# Patient Record
Sex: Female | Born: 1955 | Race: Black or African American | Hispanic: No | Marital: Married | Smoking: Never smoker
Health system: Southern US, Community
[De-identification: ages and names within clinical notes are randomized; demographics above are authoritative.]

## PROBLEM LIST (undated history)

## (undated) DIAGNOSIS — Z5189 Encounter for other specified aftercare: Secondary | ICD-10-CM

## (undated) DIAGNOSIS — K449 Diaphragmatic hernia without obstruction or gangrene: Secondary | ICD-10-CM

## (undated) DIAGNOSIS — I10 Essential (primary) hypertension: Secondary | ICD-10-CM

## (undated) DIAGNOSIS — D649 Anemia, unspecified: Secondary | ICD-10-CM

## (undated) DIAGNOSIS — M1990 Unspecified osteoarthritis, unspecified site: Secondary | ICD-10-CM

## (undated) DIAGNOSIS — C189 Malignant neoplasm of colon, unspecified: Secondary | ICD-10-CM

## (undated) DIAGNOSIS — K219 Gastro-esophageal reflux disease without esophagitis: Secondary | ICD-10-CM

## (undated) DIAGNOSIS — D759 Disease of blood and blood-forming organs, unspecified: Secondary | ICD-10-CM

## (undated) DIAGNOSIS — T7840XA Allergy, unspecified, initial encounter: Secondary | ICD-10-CM

## (undated) HISTORY — DX: Diaphragmatic hernia without obstruction or gangrene: K44.9

## (undated) HISTORY — DX: Allergy, unspecified, initial encounter: T78.40XA

## (undated) HISTORY — DX: Unspecified osteoarthritis, unspecified site: M19.90

## (undated) HISTORY — DX: Essential (primary) hypertension: I10

## (undated) HISTORY — PX: OTHER SURGICAL HISTORY: SHX169

## (undated) HISTORY — DX: Encounter for other specified aftercare: Z51.89

## (undated) HISTORY — DX: Gastro-esophageal reflux disease without esophagitis: K21.9

## (undated) HISTORY — PX: COLONOSCOPY: SHX174

## (undated) HISTORY — DX: Anemia, unspecified: D64.9

## (undated) HISTORY — DX: Malignant neoplasm of colon, unspecified: C18.9

## (undated) HISTORY — PX: POLYPECTOMY: SHX149

---

## 1978-11-03 HISTORY — PX: CHOLECYSTECTOMY: SHX55

## 1987-11-04 HISTORY — PX: TUBAL LIGATION: SHX77

## 2007-11-04 DIAGNOSIS — C189 Malignant neoplasm of colon, unspecified: Secondary | ICD-10-CM

## 2007-11-04 HISTORY — PX: PARTIAL COLECTOMY: SHX5273

## 2007-11-04 HISTORY — DX: Malignant neoplasm of colon, unspecified: C18.9

## 2008-06-26 ENCOUNTER — Ambulatory Visit: Payer: Self-pay | Admitting: Gastroenterology

## 2008-07-04 ENCOUNTER — Encounter: Payer: Self-pay | Admitting: Gastroenterology

## 2008-07-04 ENCOUNTER — Ambulatory Visit: Payer: Self-pay | Admitting: Gastroenterology

## 2008-07-04 DIAGNOSIS — C184 Malignant neoplasm of transverse colon: Secondary | ICD-10-CM

## 2008-07-07 ENCOUNTER — Telehealth: Payer: Self-pay | Admitting: Gastroenterology

## 2008-07-07 ENCOUNTER — Encounter: Payer: Self-pay | Admitting: Gastroenterology

## 2008-07-11 ENCOUNTER — Telehealth (INDEPENDENT_AMBULATORY_CARE_PROVIDER_SITE_OTHER): Payer: Self-pay

## 2008-07-12 ENCOUNTER — Ambulatory Visit: Payer: Self-pay | Admitting: Gastroenterology

## 2008-08-21 ENCOUNTER — Encounter: Payer: Self-pay | Admitting: Gastroenterology

## 2008-08-21 ENCOUNTER — Encounter (INDEPENDENT_AMBULATORY_CARE_PROVIDER_SITE_OTHER): Payer: Self-pay | Admitting: Surgery

## 2008-08-21 ENCOUNTER — Inpatient Hospital Stay (HOSPITAL_COMMUNITY): Admission: RE | Admit: 2008-08-21 | Discharge: 2008-08-27 | Payer: Self-pay | Admitting: Surgery

## 2008-08-21 ENCOUNTER — Encounter: Payer: Self-pay | Admitting: Internal Medicine

## 2008-09-05 ENCOUNTER — Telehealth: Payer: Self-pay | Admitting: Gastroenterology

## 2010-07-01 ENCOUNTER — Encounter: Payer: Self-pay | Admitting: Gastroenterology

## 2010-07-11 ENCOUNTER — Encounter (INDEPENDENT_AMBULATORY_CARE_PROVIDER_SITE_OTHER): Payer: Self-pay | Admitting: *Deleted

## 2010-12-03 NOTE — Procedures (Signed)
Summary: Recall Assessment  Recall Assessment   Imported By: Lester [place] 07/15/2010 11:35:39  _____________________________________________________________________  External Attachment:    Type:   Image     Comment:   External Document

## 2010-12-03 NOTE — Letter (Signed)
Summary: Colonoscopy Letter  [facility]  [address] [place], [place] [address]   Phone: [phone]  Fax: [phone]      July 11, 2010 MRN: [id number]   [facility] [address] [place], [place]  [address]   Dear Ms. Grewe,   According to your medical record, it is time for you to schedule a Colonoscopy. The American Cancer Society recommends this procedure as a method to detect early colon cancer. Patients with a family history of colon cancer, or a personal history of colon polyps or inflammatory bowel disease are at increased risk.  This letter has beeen generated based on the recommendations made at the time of your procedure. If you feel that in your particular situation this may no longer apply, please contact our office.  Please call our office at [phone] to schedule this appointment or to update your records at your earliest convenience.  Thank you for cooperating with [place] to provide you with the very best care possible.   Sincerely,  Judie Petit T. Russella Dar, M.D.  [facility] [phone]

## 2011-03-18 NOTE — Op Note (Signed)
[name], [name]                ACCOUNT NO.:  [id number]   MEDICAL RECORD NO.:  [id number]          PATIENT TYPE:  INP   LOCATION:  1534                         FACILITY:  [facility]   PHYSICIAN:  [name], M.D. DATE OF BIRTH:  [birth year]   DATE OF PROCEDURE:  08/21/2008  DATE OF DISCHARGE:                               OPERATIVE REPORT   PREOPERATIVE DIAGNOSIS:  Transverse colon cancer.   POSTOPERATIVE DIAGNOSIS:  Transverse colon cancer.   PROCEDURE PERFORMED:  1. Exploratory laparotomy.  2. Colotomy with rigid endoscopy.  3. Flexible colonoscopy by Dr. Stan Parks and a partial left      colectomy.   SURGEON:  [name], M.D.   Threasa HeadsMaud Deed, M.D.   ANESTHESIA:  General endotracheal.   INDICATIONS:  The patient is a 55 year old female who recently underwent  a routine screening colonoscopy.  In the region of the colon, felt to be  the distal transverse colon, she had a polyp removed measuring about 1.5  cm.  This was pedunculated and it was removed with a snare.  This  colonoscopy was performed by Dr. Claudette Parks of [facility], the  remainder of her colonoscopy was normal.  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  There was a 2 mm margin at the stalk of the polyp.  Dr. Russella Parks brought  the patient back to the endoscopy unit for another procedure in which he  tattooed the area with what is recorded as 5 mL of ink.  I spoke with  Dr. Russella Parks the day that I evaluated the patient in the office and he  states that he had injected one area just opposite the polypectomy site  and another area 4 cm distal to the polypectomy site.  She presents  today for elective resection of the transverse colon with a likely left  hemicolectomy.   DESCRIPTION OF PROCEDURE:  The patient was brought to the operating room  and placed in the supine position operating table.  After an adequate  level of general  anesthesia was obtained, a Foley catheter was placed  under sterile technique.  The patient's abdomen was prepped with  Betadine and draped in a sterile fashion.  A time-out was taken to  assure the proper patient and proper procedure.  The patient has a  previous umbilical incision from a laparoscopy.  We opened this incision  and dissected down through a large amount of adipose tissue to the  fascia.  The fascia was grasped with Kocher clamps and opened  vertically.  We entered the peritoneal cavity bluntly.  A stay suture  was placed around the fascial opening and the Hasson cannula was  inserted.  The pneumoperitoneum was obtained by insufflating CO2  maintaining maximal pressure of 15 mmHg.  The laparoscope was inserted.  There was a large amount of omentum that was densely adherent to the  abdominal wall around the umbilicus.  We were able to get around this  and look in the upper abdomen.  The patient has a  lot of intraperitoneal  fat and her omentum was quite thick.  We could not visualize the  transverse colon.  The liver appeared grossly normal.  We then made the  decision to perform an upper midline incision.  We made a midline  incision with a #10 blade and dissected down to the subcutaneous tissues  with cautery.  The fascia was opened along the linea alba.  We extended  this down to the previous umbilical incision.  The patient was found to  have a moderate size umbilical hernia containing a large amount of  omentum.  This hernia was reduced and we resected a small amount of  omentum that had been stuck in the hernia sac.  This was done with the  LigaSure device.  We then identified the transverse colon.  This  appeared grossly normal.  We could not palpate any masses.  We inserted  the Bookwalter retractor for exposure.  We mobilized the colon heading a  the distal fashion up around the splenic flexure.  Unfortunately, we did  not see any ink in this area.  We continued  mobilizing the descending  colon all the way down the sigmoid.  Again, we did not see any sign of  ink.  We then went proximally around the hepatic flexure all the way  down to the cecum and did not see any sign of tattooing.  We spent a lot  of time fully mobilizing the colon, but we were unable to visualize any  ink at all.  We then brought a sterile rigid sigmoidoscope onto the  field.  We made an incision in the tinea of the mid transverse colon.  We placed a pursestring suture around this area with 2-0 silk.  We  opened the colon and examined the colon in each direction with the rigid  sigmoidoscope.  We are able to visualize about 30 cm in each direction  and saw no sign of any ink.  We then removed the sigmoidoscope and  closed the colotomy with a pursestring suture.  We then talked again  with Dr. Russella Parks by telephone and he confirmed his previous impression  that this should have been in the distal transverse colon.  He sent his  partner, Dr. Stan Parks, who is on call today, to the operating room.  We brought a flexible colonoscope from the endoscopy unit.  The  patient's legs were placed in a frog-leg position and we performed an on-  table flexible colonoscopy.  We advanced the scope all the way to the  cecum.  We had good visualization of the entire colon.  The scope was  slowly withdrawn and we saw no sign of ink anywhere.  We saw the  previous colotomy site and this looked healthy and viable.  We saw no  new polyps.  We did not visualize any previous polypectomy scar.  The  colonoscope was then removed as he air was evacuated.  We discussed the  situation and decided to do a resection of the distal transverse colon  around the splenic flexure to the mid descending colon.  The colon was  divided with a GIA 75 stapler at both margins.  The LigaSure device was  used to resect the mesentery.  The specimen was oriented with a long  suture at its distal margin.  This was sent for  pathologic examination.  We did a side-to-side stapled anastomosis with another firing of the GIA  75.  The colotomy was closed  with a TA-60 stapler.  We then irrigated  the abdomen and removed all the sponges.  The fascia was then closed  with double-stranded #1 PDS in a running fashion.  The subcutaneous  tissues were irrigated and staples were used to close the skin.  The  patient was then extubated and brought to recovery in stable condition.  All sponge, instrument and needle counts correct.      Christina Arms. Tsuei, M.D.  Electronically Signed     MKT/MEDQ  D:  08/21/2008  T:  08/21/2008  Job:  [id number]

## 2011-03-21 NOTE — Discharge Summary (Signed)
[name], [name]                ACCOUNT NO.:  [id number]   MEDICAL RECORD NO.:  [id number]          PATIENT TYPE:  INP   LOCATION:  1534                         FACILITY:  [facility]   PHYSICIAN:  Wilmon Arms. Corliss Skains, M.D. DATE OF BIRTH:  June 18, 1956   DATE OF ADMISSION:  08/21/2008  DATE OF DISCHARGE:  08/27/2008                               DISCHARGE SUMMARY   ADMISSION DIAGNOSIS:  Malignant polyp transverse colon.   DISCHARGE DIAGNOSIS:  Malignant polyp transverse colon.   PROCEDURE:  Exploratory laparotomy with a partial left colectomy and  flexible colonoscopy.   BRIEF HISTORY:  The patient is a 55 year old female who recently  underwent a routine screening colonoscopy.  She had a pedunculated polyp  in her transverse colon which was removed with snare.  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  Dr. Claudette Head brought the patient back to the  endoscopy suite and injected the ink into the area.  The patient now  presents for elective resection.   HOSPITAL COURSE:  The patient underwent surgery on 08/21/2008.  She  underwent exploratory laparotomy.  Unfortunately, the tattooing was not  visible.  We could not locate the area of the previous polypectomy.  A  transverse colotomy was made, and we inserted a rigid sigmoidoscope  proximally and distally, but could not visualize any of the ink.  Dr.  Leone Payor of GI was consulted intraoperatively and performed a flexible on-  table colonoscopy.  We also could not identify this area.  After  speaking with Dr. Russella Dar by phone, he felt that this lesion was clearly  in the distal transverse colon.  Therefore, we did a partial left  hemicolectomy with a primary anastomosis.  The pathology report  fortunately did show that there was an area just distal to her colotomy  that appeared to be the area of the previous polypectomy.  There is no  residual cancer.  Lymph nodes were all negative.   The patient then  progressed well postoperatively.  She had an ileus for a few days.  On  postop day #4, she began having some flatus.  Her diet was advanced.  She was discharged home on postop day #5.   DISCHARGE INSTRUCTIONS:  Percocet p.r.n. for pain.  Resume all home  medications.  Follow up in one week for staple removal.      Wilmon Arms. Tsuei, M.D.  Electronically Signed     MKT/MEDQ  D:  09/19/2008  T:  09/19/2008  Job:  [id number]

## 2011-08-05 LAB — BASIC METABOLIC PANEL
BUN: 8
Calcium: 8.2 — ABNORMAL LOW
Calcium: 8.5
Chloride: 101
Chloride: 101
Creatinine, Ser: 0.74
GFR calc non Af Amer: >60
Potassium: 3.6

## 2011-08-05 LAB — CBC
HCT: 36.9
MCHC: 31.6
MCV: 73.7 — ABNORMAL LOW
Platelets: 261
RDW: 18 — ABNORMAL HIGH

## 2011-08-05 LAB — HEMOGLOBIN AND HEMATOCRIT, BLOOD
HCT: 39.8
Hemoglobin: 12.7

## 2011-08-05 LAB — PREGNANCY, URINE: Preg Test, Ur: NEGATIVE

## 2011-08-22 ENCOUNTER — Encounter: Payer: Self-pay | Admitting: Gastroenterology

## 2011-08-26 ENCOUNTER — Encounter: Payer: Self-pay | Admitting: Gastroenterology

## 2011-08-26 ENCOUNTER — Telehealth: Payer: Self-pay | Admitting: Gastroenterology

## 2011-08-26 ENCOUNTER — Ambulatory Visit (AMBULATORY_SURGERY_CENTER): Payer: 59 | Admitting: *Deleted

## 2011-08-26 VITALS — Ht 60.0 in | Wt 269.4 lb

## 2011-08-26 DIAGNOSIS — Z85038 Personal history of other malignant neoplasm of large intestine: Secondary | ICD-10-CM

## 2011-08-26 DIAGNOSIS — Z1211 Encounter for screening for malignant neoplasm of colon: Secondary | ICD-10-CM

## 2011-08-26 MED ORDER — PEG-KCL-NACL-NASULF-NA ASC-C 100 G PO SOLR: ORAL | Status: DC

## 2011-08-26 NOTE — Telephone Encounter (Signed)
Pt would like to have Rx for MoviPrep printed so that she can have it filled at [facility].  Rx printed; pt will pick up today. Ezra Sites

## 2011-09-02 ENCOUNTER — Encounter: Payer: Self-pay | Admitting: Gastroenterology

## 2011-09-02 ENCOUNTER — Ambulatory Visit (AMBULATORY_SURGERY_CENTER): Payer: 59 | Admitting: Gastroenterology

## 2011-09-02 DIAGNOSIS — Z85038 Personal history of other malignant neoplasm of large intestine: Secondary | ICD-10-CM

## 2011-09-02 DIAGNOSIS — Z860100 Personal history of colon polyps, unspecified: Secondary | ICD-10-CM

## 2011-09-02 DIAGNOSIS — Z1211 Encounter for screening for malignant neoplasm of colon: Secondary | ICD-10-CM

## 2011-09-02 MED ORDER — SODIUM CHLORIDE 0.9 % IV SOLN: 500.0000 mL | INTRAVENOUS | Status: DC

## 2011-09-02 NOTE — Patient Instructions (Signed)
No polyps today.   You do need to increase the fiber in your diet due to your diverticulosis in your colon.  You may resume your routine medications today.   If you have any questions, please call us at [phone].  Thank-you for choosing [place] for your healthcare needs today.

## 2011-09-02 NOTE — Progress Notes (Signed)
Patient arrived in recovery room drowsy, but not happy when I checked her abd to see if it were soft or distended.   She also didn't like it when I had her turn to her l side to pass gas.    Patient stated that the IV hurt badly when it was taken out.   Also stated that when it was time to go that I was rushing her out of here.    I explained what our policy was regarding the stay, and I asked her if she wanted to stay awhile in the recliner.   She declined, and her husband said that she was ready to go.    She asked to go to the bathroom, and stated that she could go by herself.  Her husband went with her for safety reasons.  MAC was suggested by Dr. Kathie Rhodes. For her next visit.

## 2011-09-03 ENCOUNTER — Telehealth: Payer: Self-pay

## 2011-09-03 NOTE — Telephone Encounter (Signed)

## 2013-04-08 LAB — BASIC METABOLIC PANEL
BUN: 7 mg/dL (ref 4–21)
Creatinine: 0.8 mg/dL (ref 0.5–1.1)
Glucose: 100 mg/dL
Sodium: 142 mmol/L (ref 137–147)

## 2013-04-08 LAB — HEPATIC FUNCTION PANEL
ALT: 12 U/L (ref 7–35)
AST: 18 U/L (ref 13–35)
Alkaline Phosphatase: 108 U/L (ref 25–125)
Bilirubin, Total: 0.7 mg/dL

## 2013-04-08 LAB — LIPID PANEL
LDL Cholesterol: 138 mg/dL
Triglycerides: 87 mg/dL (ref 40–160)

## 2013-04-08 LAB — CHLORIDE
Albumin: 3.8
Calcium: 8.9 mg/dL
Globulin, Total: 2.9
Total Protein: 6.7 g/dL

## 2013-09-14 ENCOUNTER — Encounter: Payer: Self-pay | Admitting: Physician Assistant

## 2013-09-14 ENCOUNTER — Ambulatory Visit (INDEPENDENT_AMBULATORY_CARE_PROVIDER_SITE_OTHER): Payer: 59 | Admitting: Physician Assistant

## 2013-09-14 ENCOUNTER — Other Ambulatory Visit (HOSPITAL_COMMUNITY)
Admission: RE | Admit: 2013-09-14 | Discharge: 2013-09-14 | Disposition: A | Discharge status: Home / Self Care | Payer: 59 | Source: Ambulatory Visit | Attending: Family Medicine | Admitting: Family Medicine

## 2013-09-14 ENCOUNTER — Telehealth: Payer: Self-pay | Admitting: *Deleted

## 2013-09-14 ENCOUNTER — Ambulatory Visit (INDEPENDENT_AMBULATORY_CARE_PROVIDER_SITE_OTHER): Payer: 59

## 2013-09-14 VITALS — BP 136/69 | HR 105 | Wt 284.0 lb

## 2013-09-14 DIAGNOSIS — Z13220 Encounter for screening for lipoid disorders: Secondary | ICD-10-CM

## 2013-09-14 DIAGNOSIS — Z0000 Encounter for general adult medical examination without abnormal findings: Secondary | ICD-10-CM

## 2013-09-14 DIAGNOSIS — I10 Essential (primary) hypertension: Secondary | ICD-10-CM

## 2013-09-14 DIAGNOSIS — D509 Iron deficiency anemia, unspecified: Secondary | ICD-10-CM

## 2013-09-14 DIAGNOSIS — M79609 Pain in unspecified limb: Secondary | ICD-10-CM

## 2013-09-14 DIAGNOSIS — E559 Vitamin D deficiency, unspecified: Secondary | ICD-10-CM

## 2013-09-14 DIAGNOSIS — Z131 Encounter for screening for diabetes mellitus: Secondary | ICD-10-CM

## 2013-09-14 DIAGNOSIS — Z01419 Encounter for gynecological examination (general) (routine) without abnormal findings: Secondary | ICD-10-CM

## 2013-09-14 DIAGNOSIS — M1990 Unspecified osteoarthritis, unspecified site: Secondary | ICD-10-CM

## 2013-09-14 DIAGNOSIS — Z1151 Encounter for screening for human papillomavirus (HPV): Secondary | ICD-10-CM | POA: Insufficient documentation

## 2013-09-14 DIAGNOSIS — J45909 Unspecified asthma, uncomplicated: Secondary | ICD-10-CM

## 2013-09-14 LAB — COMPLETE METABOLIC PANEL WITH GFR
ALT: 11 U/L (ref 0–35)
Albumin: 4 g/dL (ref 3.5–5.2)
CO2: 28 mEq/L (ref 19–32)
Chloride: 100 mEq/L (ref 96–112)
GFR, Est Non African American: 71 mL/min
Glucose, Bld: 98 mg/dL (ref 70–99)
Potassium: 3.9 mEq/L (ref 3.5–5.3)
Sodium: 139 mEq/L (ref 135–145)
Total Protein: 7 g/dL (ref 6.0–8.3)

## 2013-09-14 LAB — LIPID PANEL
HDL: 50 mg/dL (ref >39)
LDL Cholesterol: 148 mg/dL — ABNORMAL HIGH (ref 0–99)

## 2013-09-14 LAB — HEMOGLOBIN A1C: Hgb A1c MFr Bld: 5.9 % — ABNORMAL HIGH (ref <5.7)

## 2013-09-14 MED ORDER — FERROUS SULFATE 325 (65 FE) MG PO TABS: 325.0000 mg | ORAL_TABLET | Freq: Every day | ORAL | Status: DC

## 2013-09-14 MED ORDER — FLUTICASONE PROPIONATE 50 MCG/ACT NA SUSP: 2.0000 | NASAL | Status: DC | PRN

## 2013-09-14 MED ORDER — CELECOXIB 200 MG PO CAPS: 200.0000 mg | ORAL_CAPSULE | Freq: Two times a day (BID) | ORAL | Status: DC

## 2013-09-14 MED ORDER — TRAMADOL HCL 50 MG PO TABS: 50.0000 mg | ORAL_TABLET | Freq: Four times a day (QID) | ORAL | Status: DC | PRN

## 2013-09-14 MED ORDER — ALBUTEROL SULFATE HFA 108 (90 BASE) MCG/ACT IN AERS: 2.0000 | INHALATION_SPRAY | Freq: Four times a day (QID) | RESPIRATORY_TRACT | Status: DC | PRN

## 2013-09-14 MED ORDER — CELECOXIB 200 MG PO CAPS: 200.0000 mg | ORAL_CAPSULE | Freq: Every day | ORAL | Status: DC

## 2013-09-14 MED ORDER — HYDROCHLOROTHIAZIDE 12.5 MG PO CAPS: 12.5000 mg | ORAL_CAPSULE | ORAL | Status: DC

## 2013-09-14 NOTE — Telephone Encounter (Signed)
Typically oral prednisone is not used for non-RA joint pain. Intra-articular cortisone is a treatment. I would like to try that before oral prednisone and it's complication. Dr. Karie Schwalbe in our office is a terrific injection specialist i would like for you to see him for this matter.

## 2013-09-14 NOTE — Telephone Encounter (Signed)
Pt called back for x-ray results.  She is asking if you can send her in rx for prednisone to help with inflammation.

## 2013-09-14 NOTE — Progress Notes (Signed)
  Subjective:    Patient ID: [name], female    DOB: 09-16-1956, 57 y.o.   MRN: [id number]  HPI Patient is a 57 yo female who presents to the clinic to establish care and to get paperwork filled out for biometric screening for work.   PMH for Knee osteoarthritis, HTN, IDA, Asthma.  Patient presents with popping and pain in left thumb for last month. She denies any trauma. Taking celebrex daily.      Review of Systems  All other systems reviewed and are negative.       Objective:   Physical Exam  Constitutional: She is oriented to person, place, and time. She appears well-developed and well-nourished.  Obesity.  HENT:  Head: Normocephalic and atraumatic.  Right Ear: External ear normal.  Left Ear: External ear normal.  Nose: Nose normal.  Mouth/Throat: Oropharynx is clear and moist. No oropharyngeal exudate.  Eyes: Conjunctivae are normal.  Neck: Normal range of motion. Neck supple. No thyromegaly present.  Cardiovascular: Normal rate and normal heart sounds.   Pulmonary/Chest: Effort normal and breath sounds normal. She has no wheezes.  Abdominal: Soft. Bowel sounds are normal. There is no tenderness.  Genitourinary: Vagina normal and uterus normal. No vaginal discharge found.  No adnexal tenderness. No polyps on cervical os.   Musculoskeletal: Normal range of motion.  Left thumb popping with movement. ROM normal. Strength normal.   Lymphadenopathy:    She has no cervical adenopathy.  Neurological: She is alert and oriented to person, place, and time. She has normal reflexes. No cranial nerve deficit.  Skin: Skin is warm and dry.  Psychiatric: She has a normal mood and affect. Her behavior is normal.          Assessment & Plan:  CPE- screening labs were ordered. Up to date on flu immunization. Will wait to get records to find out last tetanus. Pap done today. Will call with results. Discussed adding vitamin d. Encouraged regular exercise and weight loss. Per  pt mammogram up to date would like 3D screening in the future. Colonoscopy are every 3 years. Pt is up to date.   Knee osteoarthritis- refilled celebrex. Is making appt with orthopedics specialist. Refilled tramadol for acute pain.   HTN- Refilled HCTZ.   Asthma- controlled. Use albuterol as needed.   Seasonal allergies- continue to use flonase as needed.   Vitamin D def- Continue OTC vitamin D.   Thumb popping and pain- Xray results were negative for any acute process. No fracture or dislocation. Mild degenerative changes in joint. Pt is already on celebrex. Will refer to Dr. Karie Schwalbe for intra-articular injection.

## 2013-09-15 NOTE — Telephone Encounter (Signed)
Pt notified & was transferred to scheduling. 

## 2013-09-16 ENCOUNTER — Encounter: Payer: Self-pay | Admitting: Sports Medicine

## 2013-09-16 ENCOUNTER — Encounter: Payer: Self-pay | Admitting: Physician Assistant

## 2013-09-16 ENCOUNTER — Ambulatory Visit (INDEPENDENT_AMBULATORY_CARE_PROVIDER_SITE_OTHER): Payer: TRICARE For Life (TFL) | Admitting: Sports Medicine

## 2013-09-16 VITALS — BP 131/73 | HR 97 | Wt 291.0 lb

## 2013-09-16 DIAGNOSIS — M19049 Primary osteoarthritis, unspecified hand: Secondary | ICD-10-CM

## 2013-09-16 DIAGNOSIS — E559 Vitamin D deficiency, unspecified: Secondary | ICD-10-CM | POA: Insufficient documentation

## 2013-09-16 DIAGNOSIS — E7800 Pure hypercholesterolemia, unspecified: Secondary | ICD-10-CM | POA: Insufficient documentation

## 2013-09-16 DIAGNOSIS — M179 Osteoarthritis of knee, unspecified: Secondary | ICD-10-CM | POA: Insufficient documentation

## 2013-09-16 DIAGNOSIS — J45909 Unspecified asthma, uncomplicated: Secondary | ICD-10-CM | POA: Insufficient documentation

## 2013-09-16 DIAGNOSIS — IMO0002 Reserved for concepts with insufficient information to code with codable children: Secondary | ICD-10-CM

## 2013-09-16 DIAGNOSIS — M65312 Trigger thumb, left thumb: Secondary | ICD-10-CM | POA: Insufficient documentation

## 2013-09-16 DIAGNOSIS — I10 Essential (primary) hypertension: Secondary | ICD-10-CM | POA: Insufficient documentation

## 2013-09-16 DIAGNOSIS — D509 Iron deficiency anemia, unspecified: Secondary | ICD-10-CM | POA: Insufficient documentation

## 2013-09-16 DIAGNOSIS — M1710 Unilateral primary osteoarthritis, unspecified knee: Secondary | ICD-10-CM

## 2013-09-16 NOTE — Progress Notes (Signed)
   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 She gets episodes were locking flexion, and she has to force it in extension which is painful. Pain is localized, moderate, persistent.  Past medical history, Surgical history, Family history not pertinant except as noted below, Social history, Allergies, and medications have been entered into the medical record, reviewed, and no changes needed.   Review of Systems: No headache, visual changes, nausea, vomiting, diarrhea, constipation, dizziness, abdominal pain, skin rash, fevers, chills, night sweats, weight loss, swollen lymph nodes, body aches, joint swelling, muscle aches, chest pain, shortness of breath, mood changes, visual or auditory hallucinations.   Objective:   General: Well Developed, well nourished, and in no acute distress.  Neuro/Psych: Alert and oriented x3, extra-ocular muscles intact, able to move all 4 extremities, sensation grossly intact. Skin: Warm and dry, no rashes noted.  Respiratory: Not using accessory muscles, speaking in full sentences, trachea midline.  Cardiovascular: Pulses palpable, no extremity edema. Abdomen: Does not appear distended. Left hand: There is a palpable nodule in the flexor pollicis longus tendon sheath, she is able to actively make the thumb trigger, the trigger can be reduced with gentle traction. She has no tenderness to palpation of the interphalangeal, metacarpophalangeal, or trapezial metacarpal joints.  Procedure: Real-time Ultrasound Guided Injection of left flexor pollicis longus tendon sheath Device: GE Logiq E  Verbal informed consent obtained.  Time-out conducted.  Noted no overlying erythema, induration, or other signs of local  infection.  Skin prepped in a sterile fashion.  Local anesthesia: Topical Ethyl chloride.  With sterile technique and under real time ultrasound guidance:  25-gauge needle advanced into the flexor pollicis longus tendon sheath, a total of 1 cc Kenalog 40, 3 cc lidocaine injected into the sheath. Completed without difficulty  Pain immediately resolved suggesting accurate placement of the medication.  Advised to call if fevers/chills, erythema, induration, drainage, or persistent bleeding.  Images permanently stored and available for review in the ultrasound unit.  Impression: Technically successful ultrasound guided injection.  Impression and Recommendations:   This case required medical decision making of moderate complexity.

## 2013-09-16 NOTE — Assessment & Plan Note (Signed)
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 She does get some triggering. Tendon sheath injection as above. Return in 4 weeks.

## 2013-09-16 NOTE — Assessment & Plan Note (Signed)
No bilateral osteoarthritis, she has used Synvisc-1 the past with a fantastic response one year ago. At this point we will go ahead and get her approved for repeat viscus supplementation to both knees.

## 2013-10-11 ENCOUNTER — Encounter: Payer: Self-pay | Admitting: *Deleted

## 2013-10-13 ENCOUNTER — Ambulatory Visit: Payer: 59 | Admitting: Sports Medicine

## 2013-10-28 ENCOUNTER — Encounter: Payer: Self-pay | Admitting: *Deleted

## 2014-06-29 ENCOUNTER — Encounter: Payer: Self-pay | Admitting: Gastroenterology

## 2014-08-02 ENCOUNTER — Encounter: Payer: Self-pay | Admitting: Physician Assistant

## 2014-08-02 ENCOUNTER — Ambulatory Visit (INDEPENDENT_AMBULATORY_CARE_PROVIDER_SITE_OTHER): Payer: 59 | Admitting: Physician Assistant

## 2014-08-02 VITALS — BP 121/73 | HR 101 | Ht 60.0 in | Wt 291.0 lb

## 2014-08-02 DIAGNOSIS — Z23 Encounter for immunization: Secondary | ICD-10-CM

## 2014-08-02 DIAGNOSIS — M170 Bilateral primary osteoarthritis of knee: Secondary | ICD-10-CM

## 2014-08-02 DIAGNOSIS — M1710 Unilateral primary osteoarthritis, unspecified knee: Secondary | ICD-10-CM

## 2014-08-02 DIAGNOSIS — J45909 Unspecified asthma, uncomplicated: Secondary | ICD-10-CM

## 2014-08-02 DIAGNOSIS — I10 Essential (primary) hypertension: Secondary | ICD-10-CM

## 2014-08-02 DIAGNOSIS — E559 Vitamin D deficiency, unspecified: Secondary | ICD-10-CM

## 2014-08-02 DIAGNOSIS — D509 Iron deficiency anemia, unspecified: Secondary | ICD-10-CM

## 2014-08-02 LAB — POCT HEMOGLOBIN: Hemoglobin: 14.7 g/dL (ref 12.2–16.2)

## 2014-08-02 MED ORDER — FLUTICASONE-SALMETEROL 250-50 MCG/DOSE IN AEPB: 1.0000 | INHALATION_SPRAY | Freq: Two times a day (BID) | RESPIRATORY_TRACT | Status: DC

## 2014-08-02 MED ORDER — CELECOXIB 200 MG PO CAPS: 200.0000 mg | ORAL_CAPSULE | Freq: Two times a day (BID) | ORAL | Status: DC

## 2014-08-02 MED ORDER — ALBUTEROL SULFATE HFA 108 (90 BASE) MCG/ACT IN AERS
2.0000 | INHALATION_SPRAY | Freq: Four times a day (QID) | RESPIRATORY_TRACT | Status: DC | PRN
Start: 2014-08-02 — End: 2015-08-03

## 2014-08-02 MED ORDER — FLUTICASONE PROPIONATE 50 MCG/ACT NA SUSP: 2.0000 | NASAL | Status: DC | PRN

## 2014-08-02 MED ORDER — HYDROCHLOROTHIAZIDE 12.5 MG PO CAPS: 12.5000 mg | ORAL_CAPSULE | ORAL | Status: DC

## 2014-08-02 MED ORDER — PHENTERMINE HCL 37.5 MG PO TABS: 37.5000 mg | ORAL_TABLET | Freq: Every day | ORAL | Status: DC

## 2014-08-02 MED ORDER — PREDNISONE 20 MG PO TABS: ORAL_TABLET | ORAL | Status: DC

## 2014-08-02 MED ORDER — ALBUTEROL SULFATE (2.5 MG/3ML) 0.083% IN NEBU: 2.5000 mg | INHALATION_SOLUTION | RESPIRATORY_TRACT | Status: DC | PRN

## 2014-08-02 MED ORDER — TRAMADOL HCL 50 MG PO TABS: 50.0000 mg | ORAL_TABLET | Freq: Four times a day (QID) | ORAL | Status: DC | PRN

## 2014-08-02 NOTE — Progress Notes (Signed)
   Subjective:    Patient ID: [name], female    DOB: 01-Dec-1955, 58 y.o.   MRN: [id number]  HPI Pt presents to the clinic for CPE but too soon. Will reschedule for November.   Needs medication refills.   IDA- taking oral iron and making her constipated. No periods. Would like checked today.   HTN- taking HCTZ daily. No problems or concerns.   Asthma- controlled on advair daily. Not had for last 2 weeks and been using rescue more and feeling really wheezing.   Bilateral knee pain ongoing. Harder to exercise. Very interested in weight loss. Occasional norco does help with pain.   Vitamin D- taking OTC not been checked in a while.       Review of Systems  All other systems reviewed and are negative.      Objective:   Physical Exam  Constitutional: She is oriented to person, place, and time. She appears well-developed and well-nourished.  Morbid obesity.  HENT:  Head: Normocephalic and atraumatic.  Cardiovascular: Regular rhythm and normal heart sounds.   Tachycardia 101.   Pulmonary/Chest: Effort normal and breath sounds normal.  Scattered wheezing bilaterally.  Neurological: She is alert and oriented to person, place, and time.  Skin: Skin is dry.  Psychiatric: She has a normal mood and affect. Her behavior is normal.          Assessment & Plan:  IDA- .Marland Kitchen Results for orders placed in visit on 08/02/14  POCT HEMOGLOBIN      Result Value Ref Range   Hemoglobin 14.7  12.2 - 16.2 g/dL   very good. If iron is constipating then cut back to once or twice a week.    HTN- HCTZ refilled for 6 months.   Asthma- controlled on advair. Gave tapered course of prednisone to help with ongoing inflammation.Refilled for 6 months.   Vitamin D def- lap slip given to recheck.   Osteoarthritis both knees- refilled norco as needed for acute pain. Discussed to consider knee injections. Weight loss can certainly help.   Morbid obesity/abnormal weight gain- started  phentermine. Recheck in 4 weeks. Discussed side effects. Encouraged calorie counting at 1500 calories daily. Add in walking.      tdap given without complication.

## 2014-08-03 ENCOUNTER — Encounter: Payer: Self-pay | Admitting: Gastroenterology

## 2014-08-30 ENCOUNTER — Ambulatory Visit: Payer: 59 | Admitting: Physician Assistant

## 2014-08-31 ENCOUNTER — Telehealth: Payer: Self-pay | Admitting: *Deleted

## 2014-08-31 NOTE — Telephone Encounter (Signed)
Dr Fuller Plan:  Pt is scheduled for recall colonoscopy 09/13/14 at [facility].  Hx of colon cancer.  During chart review for PV I see that pt has BMI 56.83 as of 08/02/14.  Do you want her to have OV with you before scheduling colonoscopy at [facility] or is she okay for direct hospital colonoscopy?  Thanks, Juliann Pulse

## 2014-08-31 NOTE — Telephone Encounter (Signed)
Direct schedule at [facility] with MAC during my next hospital week. Tues, Wed or Thurs mornings are my times for outpatients during hospital week.

## 2014-09-01 NOTE — Telephone Encounter (Signed)
Talked to pt and let her know that [name] would call to schedule pt at hospital

## 2014-09-04 NOTE — Telephone Encounter (Signed)
Left message for patient to call back  

## 2014-09-05 ENCOUNTER — Other Ambulatory Visit: Payer: Self-pay

## 2014-09-05 DIAGNOSIS — Z1211 Encounter for screening for malignant neoplasm of colon: Secondary | ICD-10-CM

## 2014-09-05 NOTE — Telephone Encounter (Signed)
appt scheduled at [facility] with Dan for 10/05/14 8:30 and pre-visit 09/11/14 8:00.  She is aware of the appt date and time

## 2014-09-07 ENCOUNTER — Encounter: Payer: Self-pay | Admitting: Family Medicine

## 2014-09-11 ENCOUNTER — Ambulatory Visit (AMBULATORY_SURGERY_CENTER): Payer: Self-pay | Admitting: *Deleted

## 2014-09-11 VITALS — Ht 60.0 in | Wt 297.4 lb

## 2014-09-11 DIAGNOSIS — Z85038 Personal history of other malignant neoplasm of large intestine: Secondary | ICD-10-CM

## 2014-09-11 MED ORDER — MOVIPREP 100 G PO SOLR: 1.0000 | Freq: Once | ORAL | Status: DC

## 2014-09-11 NOTE — Progress Notes (Signed)
Pt states she is off phentermine. ewm No home 02 use. ewm No issues with past sedation ewm No egg or soy allergies. ewm

## 2014-09-12 ENCOUNTER — Encounter: Payer: Self-pay | Admitting: Gastroenterology

## 2014-09-13 ENCOUNTER — Encounter: Payer: 59 | Admitting: Gastroenterology

## 2014-09-15 NOTE — Progress Notes (Signed)
09-15-14 1600 left message for patient to stop taking Phentermine x 14 days prior to procedure on 10-05-14.

## 2014-09-18 ENCOUNTER — Telehealth: Payer: Self-pay | Admitting: Gastroenterology

## 2014-09-18 NOTE — Telephone Encounter (Signed)
I have left a voicemail for the patient that the procedure has been cancelled I spoke with Sharee Pimple at [facility] and have cancelled the procedure

## 2014-09-22 ENCOUNTER — Ambulatory Visit: Payer: 59 | Admitting: Physician Assistant

## 2014-10-05 ENCOUNTER — Encounter (HOSPITAL_COMMUNITY): Admission: RE | Payer: Self-pay | Source: Ambulatory Visit

## 2014-10-05 ENCOUNTER — Ambulatory Visit (HOSPITAL_COMMUNITY): Admission: RE | Admit: 2014-10-05 | Payer: 59 | Source: Ambulatory Visit | Admitting: Gastroenterology

## 2014-10-05 SURGERY — COLONOSCOPY WITH PROPOFOL
Anesthesia: Monitor Anesthesia Care

## 2014-10-17 ENCOUNTER — Encounter: Payer: 59 | Admitting: Gastroenterology

## 2014-10-20 ENCOUNTER — Ambulatory Visit (INDEPENDENT_AMBULATORY_CARE_PROVIDER_SITE_OTHER): Payer: 59 | Admitting: Physician Assistant

## 2014-10-20 ENCOUNTER — Encounter: Payer: Self-pay | Admitting: Physician Assistant

## 2014-10-20 VITALS — BP 143/77 | HR 100 | Ht 60.0 in | Wt 294.0 lb

## 2014-10-20 DIAGNOSIS — E785 Hyperlipidemia, unspecified: Secondary | ICD-10-CM

## 2014-10-20 DIAGNOSIS — Z131 Encounter for screening for diabetes mellitus: Secondary | ICD-10-CM

## 2014-10-20 DIAGNOSIS — M170 Bilateral primary osteoarthritis of knee: Secondary | ICD-10-CM

## 2014-10-20 DIAGNOSIS — E669 Obesity, unspecified: Secondary | ICD-10-CM

## 2014-10-20 LAB — COMPLETE METABOLIC PANEL WITH GFR
ALBUMIN: 3.9 g/dL (ref 3.5–5.2)
ALT: 12 U/L (ref 0–35)
AST: 14 U/L (ref 0–37)
Alkaline Phosphatase: 116 U/L (ref 39–117)
BUN: 10 mg/dL (ref 6–23)
CHLORIDE: 99 meq/L (ref 96–112)
CO2: 26 mEq/L (ref 19–32)
Calcium: 9.2 mg/dL (ref 8.4–10.5)
Creat: 0.86 mg/dL (ref 0.50–1.10)
GFR, EST NON AFRICAN AMERICAN: 75 mL/min
GFR, Est African American: 87 mL/min
GLUCOSE: 95 mg/dL (ref 70–99)
POTASSIUM: 4.1 meq/L (ref 3.5–5.3)
Sodium: 140 mEq/L (ref 135–145)
TOTAL PROTEIN: 7 g/dL (ref 6.0–8.3)
Total Bilirubin: 0.9 mg/dL (ref 0.2–1.2)

## 2014-10-20 LAB — LIPID PANEL
CHOLESTEROL: 212 mg/dL — AB (ref 0–200)
HDL: 50 mg/dL (ref >39)
LDL CALC: 133 mg/dL — AB (ref 0–99)
TRIGLYCERIDES: 144 mg/dL (ref <150)
Total CHOL/HDL Ratio: 4.2 Ratio
VLDL: 29 mg/dL (ref 0–40)

## 2014-10-20 MED ORDER — LORCASERIN HCL 10 MG PO TABS: 1.0000 | ORAL_TABLET | Freq: Two times a day (BID) | ORAL | Status: DC

## 2014-10-20 MED ORDER — PREDNISONE 20 MG PO TABS: ORAL_TABLET | ORAL | Status: DC

## 2014-10-20 MED ORDER — HYDROCHLOROTHIAZIDE 12.5 MG PO TABS: 25.0000 mg | ORAL_TABLET | Freq: Every day | ORAL | Status: DC

## 2014-10-21 LAB — HEMOGLOBIN A1C
Hgb A1c MFr Bld: 6.1 % — ABNORMAL HIGH (ref <5.7)
Mean Plasma Glucose: 128 mg/dL — ABNORMAL HIGH (ref <117)

## 2014-10-24 NOTE — Progress Notes (Signed)
   Subjective:    Patient ID: [name], female    DOB: September 29, 1956, 58 y.o.   MRN: [id number]  HPI  Patient presents to the clinic to follow-up on obesity and weight loss. She was unable to tolerate the phentermine. She did not like the way it made her feel. She is interested in other options for weight loss. She knew she has to do something to get the weight off her knees. Her niece of the worse over the last couple months. She's previously been seen by an orthopedist. She has been be doing a lot of walking over the holidays and requests prednisone. Patient is aware she needs a physical but would like her lab slip today.      Review of Systems  All other systems reviewed and are negative.      Objective:   Physical Exam  Constitutional: She is oriented to person, place, and time. She appears well-developed and well-nourished.  Obesity.   HENT:  Head: Normocephalic and atraumatic.  Cardiovascular: Normal rate, regular rhythm and normal heart sounds.   Pulmonary/Chest: Effort normal and breath sounds normal.  Neurological: She is alert and oriented to person, place, and time.  Skin: Skin is dry.  Psychiatric: She has a normal mood and affect. Her behavior is normal.          Assessment & Plan:  Obesity/primary osteoarthritis bilateral knees- patient was unable to tolerate phentermine. Started pelvic twice daily. Gave free 15 day sample. Discussed side effects. Follow-up in 2 months.  Primary osteoarthritis of bilateral knees- declined knee injections today. I did give small course of prednisone that I have given before to help with inflammation in knees. Patient is aware that losing weight will also help with knee pain. Patient continues to take Celebrex daily. Discussed possible Supartz or other interventions for knee pain. Offered Dr. Darene Lamer or orthopedist follow up.   Hyperlipidemia- needs to be recheck fasting lab slip given.    Discuss with patient that she does need  complete physical. Went ahead and ordered some fasting labs.

## 2014-11-01 ENCOUNTER — Ambulatory Visit: Payer: 59 | Admitting: Physician Assistant

## 2014-12-20 ENCOUNTER — Ambulatory Visit: Payer: 59 | Admitting: Physician Assistant

## 2015-01-04 ENCOUNTER — Encounter: Payer: Self-pay | Admitting: Gastroenterology

## 2015-01-31 ENCOUNTER — Telehealth: Payer: Self-pay | Admitting: *Deleted

## 2015-01-31 NOTE — Telephone Encounter (Signed)
Pt left vm asking if you would give her another rx for prednisone for her knee.  Does she need an appt? Please advise.

## 2015-01-31 NOTE — Telephone Encounter (Signed)
Needs to come in for injection. It would last longer and get to where it needs to be. Oral prednisone is not good medicine for OA of knees.

## 2015-02-01 NOTE — Telephone Encounter (Signed)
Left message on vm that she needs appt for knee injections with T. ( I see where he saw her for osteoarthritis of knees and was referred by [facility] in 2014)

## 2015-02-02 ENCOUNTER — Ambulatory Visit: Payer: TRICARE For Life (TFL) | Admitting: Physician Assistant

## 2015-03-02 ENCOUNTER — Encounter: Payer: Self-pay | Admitting: Physician Assistant

## 2015-03-02 ENCOUNTER — Ambulatory Visit (INDEPENDENT_AMBULATORY_CARE_PROVIDER_SITE_OTHER): Payer: 59 | Admitting: Physician Assistant

## 2015-03-02 DIAGNOSIS — J309 Allergic rhinitis, unspecified: Secondary | ICD-10-CM | POA: Diagnosis not present

## 2015-03-02 DIAGNOSIS — R635 Abnormal weight gain: Secondary | ICD-10-CM

## 2015-03-02 MED ORDER — TRAMADOL HCL 50 MG PO TABS: 50.0000 mg | ORAL_TABLET | Freq: Four times a day (QID) | ORAL | Status: DC | PRN

## 2015-03-02 MED ORDER — LORCASERIN HCL 10 MG PO TABS: 1.0000 | ORAL_TABLET | Freq: Two times a day (BID) | ORAL | Status: DC

## 2015-03-02 MED ORDER — MELOXICAM 15 MG PO TABS: 15.0000 mg | ORAL_TABLET | Freq: Every day | ORAL | Status: DC

## 2015-03-02 MED ORDER — METHYLPREDNISOLONE SODIUM SUCC 125 MG IJ SOLR
125.0000 mg | Freq: Once | INTRAMUSCULAR | Status: AC
  Administered 2015-03-02: 125 mg via INTRAMUSCULAR

## 2015-03-02 MED ORDER — FLUTICASONE PROPIONATE 50 MCG/ACT NA SUSP: 2.0000 | NASAL | Status: DC | PRN

## 2015-03-02 NOTE — Patient Instructions (Signed)
Continue zyrtec/flonase/nasal saline washes  Milta Deiters med sinus wash.

## 2015-03-04 NOTE — Progress Notes (Signed)
   Subjective:    Patient ID: [name], female    DOB: 05/04/1956, 59 y.o.   MRN: [id number]  HPI  Pt presents to the clinic to follow up on Belviq. She did not start right a way and only been taking one tablet daily for 1 and 1/2 weeks. She does feel like medication is working and her appetite decreased. She is very motivated and has started walking. Down 2lbs in last week. No side effects of medication.   She has felt facial pressure, nasal congestion off and on for the past 2 weeks. She does have some ear pain that she wants me to check out. No drainage or discharge. No fever or chills. No cough or SOB. Not tried anything.     Review of Systems  All other systems reviewed and are negative.      Objective:   Physical Exam  Constitutional: She is oriented to person, place, and time.  Morbid obesity.   HENT:  Head: Normocephalic and atraumatic.  Right Ear: External ear normal.  Left Ear: External ear normal.  Nose: Nose normal.  Mouth/Throat: Oropharynx is clear and moist. No oropharyngeal exudate.  Eyes: Conjunctivae are normal. Right eye exhibits no discharge. Left eye exhibits no discharge.  Neck: Normal range of motion. Neck supple.  Cardiovascular: Normal rate, regular rhythm and normal heart sounds.   Pulmonary/Chest: Effort normal and breath sounds normal. She has no wheezes.  Neurological: She is alert and oriented to person, place, and time.  Skin: Skin is dry.  Psychiatric: She has a normal mood and affect. Her behavior is normal.          Assessment & Plan:  Obesity/abnormal weight gain- has not been on beliviq long enough to know if losing proper amt of weight. Continue and will recheck in 3 months. She does seem to be tolerating well. Discussed to start taking as directed twice a day. Discussed walking regimen and diet plan.   Allergic sinusitis- solumedrol 125mg  IM given today. Continue zyrtec/flonase/nasal saline washes  Milta Deiters med sinus wash.

## 2015-05-30 ENCOUNTER — Encounter: Payer: Self-pay | Admitting: Gastroenterology

## 2015-07-02 ENCOUNTER — Ambulatory Visit: Payer: TRICARE For Life (TFL) | Admitting: Physician Assistant

## 2015-07-20 ENCOUNTER — Ambulatory Visit: Payer: TRICARE For Life (TFL) | Admitting: Physician Assistant

## 2015-08-01 ENCOUNTER — Encounter: Payer: Self-pay | Admitting: Gastroenterology

## 2015-08-03 ENCOUNTER — Encounter: Payer: Self-pay | Admitting: Family Medicine

## 2015-08-03 ENCOUNTER — Ambulatory Visit (INDEPENDENT_AMBULATORY_CARE_PROVIDER_SITE_OTHER): Payer: 59 | Admitting: Family Medicine

## 2015-08-03 VITALS — BP 123/69 | HR 93 | Ht 60.0 in | Wt 290.0 lb

## 2015-08-03 DIAGNOSIS — E7800 Pure hypercholesterolemia, unspecified: Secondary | ICD-10-CM

## 2015-08-03 DIAGNOSIS — J0110 Acute frontal sinusitis, unspecified: Secondary | ICD-10-CM

## 2015-08-03 DIAGNOSIS — E559 Vitamin D deficiency, unspecified: Secondary | ICD-10-CM

## 2015-08-03 DIAGNOSIS — Z8639 Personal history of other endocrine, nutritional and metabolic disease: Secondary | ICD-10-CM | POA: Diagnosis not present

## 2015-08-03 DIAGNOSIS — I10 Essential (primary) hypertension: Secondary | ICD-10-CM | POA: Diagnosis not present

## 2015-08-03 DIAGNOSIS — Z0000 Encounter for general adult medical examination without abnormal findings: Secondary | ICD-10-CM | POA: Diagnosis not present

## 2015-08-03 DIAGNOSIS — Z114 Encounter for screening for human immunodeficiency virus [HIV]: Secondary | ICD-10-CM

## 2015-08-03 DIAGNOSIS — M19042 Primary osteoarthritis, left hand: Secondary | ICD-10-CM

## 2015-08-03 DIAGNOSIS — M19041 Primary osteoarthritis, right hand: Secondary | ICD-10-CM

## 2015-08-03 DIAGNOSIS — Z1159 Encounter for screening for other viral diseases: Secondary | ICD-10-CM

## 2015-08-03 DIAGNOSIS — R0683 Snoring: Secondary | ICD-10-CM

## 2015-08-03 LAB — COMPLETE METABOLIC PANEL WITH GFR
ALBUMIN: 3.9 g/dL (ref 3.6–5.1)
ALK PHOS: 110 U/L (ref 33–130)
ALT: 10 U/L (ref 6–29)
AST: 15 U/L (ref 10–35)
BILIRUBIN TOTAL: 0.8 mg/dL (ref 0.2–1.2)
BUN: 11 mg/dL (ref 7–25)
CALCIUM: 8.9 mg/dL (ref 8.6–10.4)
CO2: 23 mmol/L (ref 20–31)
Chloride: 101 mmol/L (ref 98–110)
Creat: 0.78 mg/dL (ref 0.50–1.05)
GFR, EST NON AFRICAN AMERICAN: 84 mL/min (ref >=60)
GFR, Est African American: >89 mL/min (ref >=60)
Glucose, Bld: 97 mg/dL (ref 65–99)
Potassium: 4.1 mmol/L (ref 3.5–5.3)
Sodium: 136 mmol/L (ref 135–146)
TOTAL PROTEIN: 6.8 g/dL (ref 6.1–8.1)

## 2015-08-03 LAB — CBC
HCT: 45.1 % (ref 36.0–46.0)
Hemoglobin: 14.9 g/dL (ref 12.0–15.0)
MCH: 24.3 pg — AB (ref 26.0–34.0)
MCHC: 33 g/dL (ref 30.0–36.0)
MCV: 73.7 fL — ABNORMAL LOW (ref 78.0–100.0)
MPV: 9.6 fL (ref 8.6–12.4)
PLATELETS: 252 10*3/uL (ref 150–400)
RBC: 6.12 MIL/uL — ABNORMAL HIGH (ref 3.87–5.11)
RDW: 17.3 % — AB (ref 11.5–15.5)
WBC: 6.8 10*3/uL (ref 4.0–10.5)

## 2015-08-03 LAB — FERRITIN: FERRITIN: 81 ng/mL (ref 10–291)

## 2015-08-03 LAB — LIPID PANEL
CHOLESTEROL: 209 mg/dL — AB (ref 125–200)
HDL: 42 mg/dL — AB (ref >=46)
LDL Cholesterol: 139 mg/dL — ABNORMAL HIGH (ref <130)
TRIGLYCERIDES: 140 mg/dL (ref <150)
Total CHOL/HDL Ratio: 5 Ratio (ref <=5.0)
VLDL: 28 mg/dL (ref <30)

## 2015-08-03 LAB — HEMOGLOBIN A1C
Hgb A1c MFr Bld: 6 % — ABNORMAL HIGH (ref <5.7)
MEAN PLASMA GLUCOSE: 126 mg/dL — AB (ref <117)

## 2015-08-03 MED ORDER — MELOXICAM 15 MG PO TABS: 15.0000 mg | ORAL_TABLET | Freq: Every day | ORAL | Status: DC

## 2015-08-03 MED ORDER — CELECOXIB 200 MG PO CAPS: 200.0000 mg | ORAL_CAPSULE | Freq: Two times a day (BID) | ORAL | Status: DC

## 2015-08-03 MED ORDER — AMOXICILLIN 875 MG PO TABS: 875.0000 mg | ORAL_TABLET | Freq: Two times a day (BID) | ORAL | Status: DC

## 2015-08-03 MED ORDER — ALBUTEROL SULFATE HFA 108 (90 BASE) MCG/ACT IN AERS: 2.0000 | INHALATION_SPRAY | Freq: Four times a day (QID) | RESPIRATORY_TRACT | Status: DC | PRN

## 2015-08-03 MED ORDER — FLUTICASONE PROPIONATE 50 MCG/ACT NA SUSP
2.0000 | NASAL | Status: DC | PRN
Start: 2015-08-03 — End: 2015-12-27

## 2015-08-03 MED ORDER — TRAMADOL HCL 50 MG PO TABS: 50.0000 mg | ORAL_TABLET | Freq: Four times a day (QID) | ORAL | Status: DC | PRN

## 2015-08-03 MED ORDER — HYDROCHLOROTHIAZIDE 12.5 MG PO CAPS: 12.5000 mg | ORAL_CAPSULE | ORAL | Status: DC

## 2015-08-03 MED ORDER — ALBUTEROL SULFATE (2.5 MG/3ML) 0.083% IN NEBU: 2.5000 mg | INHALATION_SOLUTION | RESPIRATORY_TRACT | Status: DC | PRN

## 2015-08-03 NOTE — Progress Notes (Addendum)
Subjective:     [name] is a 59 y.o. female and is here for a comprehensive physical exam. The patient reports problems - pain in her hands. . She says they get tight and stiff and almost cramp at times. She does a lot of typing particular with her right hand because she says she's not a great tightness it tends to do one-handed. She denies any swelling or recent trauma to the hands. She are he takes Celebrex daily for general arthritis.   She also thinks she may have a sinus infection. She's had sinus congestion with bilateral facial pain over both cheeks for one week.Fevers chills or sweats. No cough. She feels like her ears are starting to get blocked the last couple of days. She is Re: On Flonase and Zyrtec for her allergies. She tries to avoid decongestants.   Social History   Social History  . Marital Status: Married    Spouse Name: N/A  . Number of Children: N/A  . Years of Education: N/A   Occupational History  . Not on file.   Social History Main Topics  . Smoking status: Never Smoker   . Smokeless tobacco: Never Used  . Alcohol Use: No  . Drug Use: No  . Sexual Activity: Yes   Other Topics Concern  . Not on file   Social History Narrative   Health Maintenance  Topic Date Due  . Hepatitis C Screening  [birth year]  . HIV Screening  10/27/1971  . COLONOSCOPY  09/01/2014  . INFLUENZA VACCINE  06/04/2015  . MAMMOGRAM  08/25/2016  . PAP SMEAR  09/14/2016  . TETANUS/TDAP  08/02/2024    The following portions of the patient's history were reviewed and updated as appropriate: allergies, current medications, past family history, past medical history, past social history, past surgical history and problem list.  Review of Systems Pertinent items noted in HPI and remainder of comprehensive ROS otherwise negative.   Objective:    BP 123/69 mmHg  Pulse 93  Ht 5' (1.524 m)  Wt 290 lb (131.543 kg)  BMI 56.64 kg/m2 General appearance: alert, cooperative and  appears stated age Head: Normocephalic, without obvious abnormality, atraumatic Eyes: conj clear, EOMI, PEERLA Ears: normal TM's and external ear canals both ears Nose: Nares normal. Septum midline. Mucosa normal. No drainage or sinus tenderness. Throat: lips, mucosa, and tongue normal; teeth and gums normal Neck: no adenopathy, no carotid bruit, no JVD, supple, symmetrical, trachea midline and thyroid not enlarged, symmetric, no tenderness/mass/nodules Back: symmetric, no curvature. ROM normal. No CVA tenderness. Lungs: clear to auscultation bilaterally Heart: regular rate and rhythm, S1, S2 normal, no murmur, click, rub or gallop Abdomen: soft, non-tender; bowel sounds normal; no masses,  no organomegaly Extremities: extremities normal, atraumatic, no cyanosis or edema,  Hands with no swelling of the joints or tenderness. Normal flexion and extension of the fingers. Pulses: 2+ and symmetric Skin: Skin color, texture, turgor normal. No rashes or lesions Lymph nodes: Cervical adenopathy: nl  and Axillary adenopathy: nl Neurologic: Alert and oriented X 3, normal strength and tone. Normal symmetric reflexes. Normal coordination and gait    Assessment:    Healthy female exam.      Plan:     See After Visit Summary for Counseling Recommendations   Keep up a regular exercise program and make sure you are eating a healthy diet Try to eat 4 servings of dairy a day, or if you are lactose intolerant take a calcium with vitamin D  daily.  Your vaccines are up to date.   screened for diabetes.  Colonoscopy is overdue. It is scheduled for November.  Mammogram up-to-date. Due next month.  Says she plans to schedule an eye exam this fall.  History of iron deficiency anemia-we'll check CBC and ferritin.  Snoring- I would like to screen her for sleep apnea based on her history of snoring and elevated BMI.  Stopping questionnaire score of 4 out of 8. This is considered a positive screen for high  risk for potential sleep apnea. We'll move forward with ordering sleep study over at [facility]. She answered yes to snoring, feeling tired and sleepy during the daytime, BMI greater than 35, and age over 28.  Acute sinusitis-will treat with amoxicillin. Call if not significantly better in one week.  Hand Osteoarthritis- based on her description I think it's most consistent with arthritis. Recommend occasional Tylenol use in addition to  Her Celebrex if needed for pain relief. Also work on stretching of the hands. If she starts to notice any joint swelling or redness then please let us know.  HTN - well controlled.  F/U in 4 months.     morbid obesity-we discussed possibly considering going to the bariatric clinic. She knows that her insurance will not cover weight loss surgery but they may cover medical management encouraged her to consider it.

## 2015-08-04 LAB — VITAMIN D 25 HYDROXY (VIT D DEFICIENCY, FRACTURES): VIT D 25 HYDROXY: 34 ng/mL (ref 30–100)

## 2015-08-04 LAB — HEPATITIS C ANTIBODY: HCV Ab: NEGATIVE

## 2015-08-04 LAB — HIV ANTIBODY (ROUTINE TESTING W REFLEX): HIV 1&2 Ab, 4th Generation: NONREACTIVE

## 2015-08-06 ENCOUNTER — Telehealth: Payer: Self-pay | Admitting: *Deleted

## 2015-08-06 NOTE — Telephone Encounter (Signed)
Pt's results mailed per her Harrietta Guardian

## 2015-08-07 ENCOUNTER — Telehealth: Payer: Self-pay | Admitting: *Deleted

## 2015-08-07 NOTE — Telephone Encounter (Signed)
Pt called and lvm asking if Dr. Madilyn Fireman would sign for her to get a handicap placard. She stated that she is having trouble with her knees and until she loses the weight she cannot have the surgery. Will fwd to Dr. Madilyn Fireman for advice.[name] [place]

## 2015-08-07 NOTE — Telephone Encounter (Signed)
She needs to get from her orthopedist.

## 2015-08-08 NOTE — Telephone Encounter (Signed)
Pt informed.[name]  

## 2015-08-31 ENCOUNTER — Telehealth: Payer: Self-pay | Admitting: *Deleted

## 2015-08-31 NOTE — Telephone Encounter (Signed)
Dr Fuller Plan,  This patient is scheduled for a recall colon on 11-18 Friday with you. She has a history of colon cancer. Reviewing her chart she has a BMI of 58.6 as of 07-2015. Do you want her to have an OV or can she be a direct colon at the hospital?  Please advise and thanks for your time   Marijean Niemann

## 2015-08-31 NOTE — Telephone Encounter (Signed)
Direct schedule at hospital is fine.

## 2015-08-31 NOTE — Telephone Encounter (Signed)
Noted. Will cancel [facility] colon as scheduled. Forward note to Adah Salvage RN  Lelan Pons PV

## 2015-09-03 ENCOUNTER — Other Ambulatory Visit: Payer: Self-pay

## 2015-09-03 DIAGNOSIS — Z85038 Personal history of other malignant neoplasm of large intestine: Secondary | ICD-10-CM

## 2015-09-03 NOTE — Telephone Encounter (Signed)
Patient is scheduled for colon at [facility] 10/02/15 10:30.  She will need to arrive at 9:00

## 2015-09-07 ENCOUNTER — Ambulatory Visit (AMBULATORY_SURGERY_CENTER): Payer: Self-pay | Admitting: *Deleted

## 2015-09-07 VITALS — Ht 60.0 in | Wt 293.0 lb

## 2015-09-07 DIAGNOSIS — Z85038 Personal history of other malignant neoplasm of large intestine: Secondary | ICD-10-CM

## 2015-09-07 MED ORDER — NA SULFATE-K SULFATE-MG SULF 17.5-3.13-1.6 GM/177ML PO SOLN: 1.0000 | Freq: Once | ORAL | Status: DC

## 2015-09-07 NOTE — Progress Notes (Signed)
No egg or soy allergy. No anesthesia problems.  No home O2.  No diet meds.  

## 2015-09-08 LAB — HM MAMMOGRAPHY

## 2015-09-12 ENCOUNTER — Encounter: Payer: Self-pay | Admitting: Family Medicine

## 2015-09-19 ENCOUNTER — Encounter (HOSPITAL_COMMUNITY): Payer: Self-pay | Admitting: *Deleted

## 2015-09-21 ENCOUNTER — Encounter: Payer: 59 | Admitting: Gastroenterology

## 2015-10-02 ENCOUNTER — Encounter (HOSPITAL_COMMUNITY): Payer: Self-pay

## 2015-10-02 ENCOUNTER — Encounter (HOSPITAL_COMMUNITY)
Admission: RE | Disposition: A | Discharge status: Home / Self Care | Payer: Self-pay | Source: Ambulatory Visit | Attending: Gastroenterology

## 2015-10-02 ENCOUNTER — Ambulatory Visit (HOSPITAL_COMMUNITY): Payer: 59 | Admitting: Anesthesiology

## 2015-10-02 ENCOUNTER — Ambulatory Visit (HOSPITAL_COMMUNITY)
Admission: RE | Admit: 2015-10-02 | Discharge: 2015-10-02 | Disposition: A | Discharge status: Home / Self Care | Payer: 59 | Source: Ambulatory Visit | Attending: Gastroenterology | Admitting: Gastroenterology

## 2015-10-02 DIAGNOSIS — Z1211 Encounter for screening for malignant neoplasm of colon: Secondary | ICD-10-CM | POA: Insufficient documentation

## 2015-10-02 DIAGNOSIS — J019 Acute sinusitis, unspecified: Secondary | ICD-10-CM | POA: Diagnosis not present

## 2015-10-02 DIAGNOSIS — K5730 Diverticulosis of large intestine without perforation or abscess without bleeding: Secondary | ICD-10-CM | POA: Insufficient documentation

## 2015-10-02 DIAGNOSIS — M1990 Unspecified osteoarthritis, unspecified site: Secondary | ICD-10-CM | POA: Insufficient documentation

## 2015-10-02 DIAGNOSIS — Z980 Intestinal bypass and anastomosis status: Secondary | ICD-10-CM | POA: Insufficient documentation

## 2015-10-02 DIAGNOSIS — Z6841 Body Mass Index (BMI) 40.0 and over, adult: Secondary | ICD-10-CM | POA: Diagnosis not present

## 2015-10-02 DIAGNOSIS — Z86010 Personal history of colonic polyps: Secondary | ICD-10-CM | POA: Diagnosis not present

## 2015-10-02 DIAGNOSIS — Z85038 Personal history of other malignant neoplasm of large intestine: Secondary | ICD-10-CM | POA: Diagnosis not present

## 2015-10-02 DIAGNOSIS — K219 Gastro-esophageal reflux disease without esophagitis: Secondary | ICD-10-CM | POA: Diagnosis not present

## 2015-10-02 DIAGNOSIS — J45909 Unspecified asthma, uncomplicated: Secondary | ICD-10-CM | POA: Diagnosis not present

## 2015-10-02 DIAGNOSIS — K449 Diaphragmatic hernia without obstruction or gangrene: Secondary | ICD-10-CM | POA: Insufficient documentation

## 2015-10-02 DIAGNOSIS — G709 Myoneural disorder, unspecified: Secondary | ICD-10-CM | POA: Insufficient documentation

## 2015-10-02 DIAGNOSIS — I10 Essential (primary) hypertension: Secondary | ICD-10-CM | POA: Diagnosis not present

## 2015-10-02 HISTORY — DX: Disease of blood and blood-forming organs, unspecified: D75.9

## 2015-10-02 HISTORY — PX: COLONOSCOPY WITH PROPOFOL: SHX5780

## 2015-10-02 SURGERY — COLONOSCOPY WITH PROPOFOL
Anesthesia: Monitor Anesthesia Care

## 2015-10-02 MED ORDER — PROPOFOL 10 MG/ML IV BOLUS
INTRAVENOUS | Status: AC
  Filled 2015-10-02: qty 40

## 2015-10-02 MED ORDER — PROPOFOL 500 MG/50ML IV EMUL
INTRAVENOUS | Status: DC | PRN
  Administered 2015-10-02: 40 mg via INTRAVENOUS

## 2015-10-02 MED ORDER — PROPOFOL 500 MG/50ML IV EMUL
INTRAVENOUS | Status: DC | PRN
  Administered 2015-10-02: 200 ug/kg/min via INTRAVENOUS

## 2015-10-02 MED ORDER — SODIUM CHLORIDE 0.9 % IV SOLN: INTRAVENOUS | Status: DC

## 2015-10-02 MED ORDER — LACTATED RINGERS IV SOLN
INTRAVENOUS | Status: DC
Start: 2015-10-02 — End: 2015-10-02
  Administered 2015-10-02: 1000 mL via INTRAVENOUS
  Administered 2015-10-02: 10:00:00 via INTRAVENOUS

## 2015-10-02 MED ORDER — ONDANSETRON HCL 4 MG/2ML IJ SOLN
INTRAMUSCULAR | Status: DC | PRN
  Administered 2015-10-02: 4 mg via INTRAVENOUS

## 2015-10-02 SURGICAL SUPPLY — 21 items

## 2015-10-02 NOTE — Transfer of Care (Signed)
Immediate Anesthesia Transfer of Care Note  Patient: [name]  Procedure(s) Performed: Procedure(s): COLONOSCOPY WITH PROPOFOL (N/A)  Patient Location: PACU  Anesthesia Type:MAC  Level of Consciousness:  sedated, patient cooperative and responds to stimulation  Airway & Oxygen Therapy:Patient Spontanous Breathing and Patient connected to face mask oxgen  Post-op Assessment:  Report given to PACU RN and Post -op Vital signs reviewed and stable  Post vital signs:  Reviewed and stable  Last Vitals:  Filed Vitals:   10/02/15 0944  BP: 123/80  Pulse: 89  Temp: 36.7 C  Resp: 22    Complications: No apparent anesthesia complications

## 2015-10-02 NOTE — H&P (Signed)
[name] is a 59 y.o. female and is here for a comprehensive physical exam. The patient reports problems - pain in her hands. . She says they get tight and stiff and almost cramp at times. She does a lot of typing particular with her right hand because she says she's not a great tightness it tends to do one-handed. She denies any swelling or recent trauma to the hands. She are he takes Celebrex daily for general arthritis.  She also thinks she may have a sinus infection. She's had sinus congestion with bilateral facial pain over both cheeks for one week.Fevers chills or sweats. No cough. She feels like her ears are starting to get blocked the last couple of days. She is Re: On Flonase and Zyrtec for her allergies. She tries to avoid decongestants.   Social History   Social History  . Marital Status: Married    Spouse Name: N/A  . Number of Children: N/A  . Years of Education: N/A   Occupational History  . Not on file.   Social History Main Topics  . Smoking status: Never Smoker   . Smokeless tobacco: Never Used  . Alcohol Use: No  . Drug Use: No  . Sexual Activity: Yes   Other Topics Concern  . Not on file   Social History Narrative   Health Maintenance  Topic Date Due  . Hepatitis C Screening  16-Jan-1956  . HIV Screening  10/27/1971  . COLONOSCOPY  09/01/2014  . INFLUENZA VACCINE  06/04/2015  . MAMMOGRAM  08/25/2016  . PAP SMEAR  09/14/2016  . TETANUS/TDAP  08/02/2024    The following portions of the patient's history were reviewed and updated as appropriate: allergies, current medications, past family history, past medical history, past social history, past surgical history and problem list.  Review of Systems Pertinent items noted in HPI and remainder of comprehensive ROS otherwise negative.   Objective:    BP 123/69 mmHg  Pulse 93  Ht 5' (1.524 m)  Wt 290 lb (131.543 kg)  BMI  56.64 kg/m2 General appearance: alert, cooperative and appears stated age Head: Normocephalic, without obvious abnormality, atraumatic Eyes: conj clear, EOMI, PEERLA Ears: normal TM's and external ear canals both ears Nose: Nares normal. Septum midline. Mucosa normal. No drainage or sinus tenderness. Throat: lips, mucosa, and tongue normal; teeth and gums normal Neck: no adenopathy, no carotid bruit, no JVD, supple, symmetrical, trachea midline and thyroid not enlarged, symmetric, no tenderness/mass/nodules Back: symmetric, no curvature. ROM normal. No CVA tenderness. Lungs: clear to auscultation bilaterally Heart: regular rate and rhythm, S1, S2 normal, no murmur, click, rub or gallop Abdomen: soft, non-tender; bowel sounds normal; no masses, no organomegaly Extremities: extremities normal, atraumatic, no cyanosis or edema, Hands with no swelling of the joints or tenderness. Normal flexion and extension of the fingers. Pulses: 2+ and symmetric Skin: Skin color, texture, turgor normal. No rashes or lesions Lymph nodes: Cervical adenopathy: nl  and Axillary adenopathy: nl Neurologic: Alert and oriented X 3, normal strength and tone. Normal symmetric reflexes. Normal coordination and gait   Assessment:    Healthy female exam.    Plan:  Keep up a regular exercise program and make sure you are eating a healthy diet Try to eat 4 servings of dairy a day, or if you are lactose intolerant take a calcium with vitamin D daily.  Your vaccines are up to date.  Screened for diabetes. Colonoscopy is overdue for personal history of colon cancer arising in  a TVA. Mammogram up-to-date. Due next month. Says she plans to schedule an eye exam this fall.  History of iron deficiency anemia-we'll check CBC and ferritin.  Snoring- I would like to screen her for sleep apnea based on her history of snoring and elevated BMI. Stopping questionnaire score of 4 out of 8. This is considered a positive  screen for high risk for potential sleep apnea. We'll move forward with ordering sleep study over at [facility]. She answered yes to snoring, feeling tired and sleepy during the daytime, BMI greater than 35, and age over 27.  Acute sinusitis-will treat with amoxicillin. Call if not significantly better in one week.  Hand Osteoarthritis- based on her description I think it's most consistent with arthritis. Recommend occasional Tylenol use in addition to Her Celebrex if needed for pain relief. Also work on stretching of the hands. If she starts to notice any joint swelling or redness then please let us know.  HTN - well controlled. F/U in 4 months.   morbid obesity-we discussed possibly considering going to the bariatric clinic. She knows that her insurance will not cover weight loss surgery but they may cover medical management encouraged her to consider it.

## 2015-10-02 NOTE — Op Note (Signed)
[facility] [place], [id number]   COLONOSCOPY PROCEDURE REPORT  PATIENT: [name], [name]  MR#: [id number] BIRTHDATE: 1956-04-05 , 45  yrs. old GENDER: female ENDOSCOPIST: Ladene Artist, MD, Mar[facility] Regal REFERRED BY: Beatrice Lecher, M.D. PROCEDURE DATE:  10/02/2015 PROCEDURE:   Colonoscopy, surveillance First Screening Colonoscopy - Avg.  risk and is 50 yrs.  old or older - No.  Prior Negative Screening - Now for repeat screening. N/A  History of Adenoma - Now for follow-up colonoscopy & has been > or = to 3 yrs.  Yes hx of adenoma.  Has been 3 or more years since last colonoscopy.  Polyps removed today? No Recommend repeat exam, <10 yrs? Yes high risk ASA CLASS:   Class III INDICATIONS:Screening for colonic neoplasia, PH Colon or Rectal Adenocarcinoma, and PH Colon Adenoma. MEDICATIONS: Monitored anesthesia care and Per Anesthesia DESCRIPTION OF PROCEDURE:   After the risks benefits and alternatives of the procedure were thoroughly explained, informed consent was obtained.  The digital rectal exam revealed no abnormalities of the rectum.   The Pentax Ped Colon [id number] endoscope was introduced through the anus and advanced to the cecum, which was identified by both the appendix and ileocecal valve. No adverse events experienced.   The quality of the prep was good.  (Suprep was used)  The instrument was then slowly withdrawn as the colon was fully examined. Estimated blood loss is zero unless otherwise noted in this procedure report.    COLON FINDINGS: There was evidence of a normal appearing prior surgical anastomosis in the transverse colon.   There was mild diverticulosis noted in the sigmoid colon.   The examination was otherwise normal.  Retroflexed views revealed no abnormalities. The time to cecum = 4.6 Withdrawal time = 14.5   The scope was withdrawn and the procedure completed. COMPLICATIONS: There were no immediate  complications.  ENDOSCOPIC IMPRESSION: 1.   Prior surgical anastomosis in the transverse colon 2.   Mild diverticulosis in the sigmoid colon 3.   The examination was otherwise normal  RECOMMENDATIONS: 1.  High fiber diet with liberal fluid intake. 2.  Repeat Colonoscopy in 5 years.  eSigned:  Ladene Artist, MD, [facility] 10/02/2015 10:41 AM

## 2015-10-02 NOTE — Anesthesia Preprocedure Evaluation (Addendum)
Anesthesia Evaluation  Patient identified by MRN, date of birth, ID band Patient awake    Reviewed: Allergy & Precautions, NPO status , Patient's Chart, lab work & pertinent test results  Airway Mallampati: II  TM Distance: >3 FB Neck ROM: Full    Dental no notable dental hx.    Pulmonary asthma ,    Pulmonary exam normal breath sounds clear to auscultation       Cardiovascular hypertension, Pt. on medications Normal cardiovascular exam Rhythm:Regular Rate:Normal     Neuro/Psych  Neuromuscular disease negative psych ROS   GI/Hepatic Neg liver ROS, hiatal hernia, GERD  ,  Endo/Other  Morbid obesity  Renal/GU negative Renal ROS  negative genitourinary   Musculoskeletal  (+) Arthritis ,   Abdominal (+) + obese,   Peds negative pediatric ROS (+)  Hematology negative hematology ROS (+) Blood dyscrasia, anemia ,   Anesthesia Other Findings   Reproductive/Obstetrics negative OB ROS                             Anesthesia Physical Anesthesia Plan  ASA: III  Anesthesia Plan: MAC   Post-op Pain Management:    Induction: Intravenous  Airway Management Planned: Natural Airway  Additional Equipment:   Intra-op Plan:   Post-operative Plan:   Informed Consent: I have reviewed the patients History and Physical, chart, labs and discussed the procedure including the risks, benefits and alternatives for the proposed anesthesia with the patient or authorized representative who has indicated his/her understanding and acceptance.   Dental advisory given  Plan Discussed with: CRNA  Anesthesia Plan Comments:         Anesthesia Quick Evaluation

## 2015-10-02 NOTE — Discharge Instructions (Signed)
Colonoscopy, Care After °Refer to this sheet in the next few weeks. These instructions provide you with information on caring for yourself after your procedure. Your health care provider may also give you more specific instructions. Your treatment has been planned according to current medical practices, but problems sometimes occur. Call your health care provider if you have any problems or questions after your procedure. °WHAT TO EXPECT AFTER THE PROCEDURE  °After your procedure, it is typical to have the following: °· A small amount of blood in your stool. °· Moderate amounts of gas and mild abdominal cramping or bloating. °HOME CARE INSTRUCTIONS °· Do not drive, operate machinery, or sign important documents for 24 hours. °· You may shower and resume your regular physical activities, but move at a slower pace for the first 24 hours. °· Take frequent rest periods for the first 24 hours. °· Walk around or put a warm pack on your abdomen to help reduce abdominal cramping and bloating. °· Drink enough fluids to keep your urine clear or pale yellow. °· You may resume your normal diet as instructed by your health care provider. Avoid heavy or fried foods that are hard to digest. °· Avoid drinking alcohol for 24 hours or as instructed by your health care provider. °· Only take over-the-counter or prescription medicines as directed by your health care provider. °· If a tissue sample (biopsy) was taken during your procedure: °¨ Do not take aspirin or blood thinners for 7 days, or as instructed by your health care provider. °¨ Do not drink alcohol for 7 days, or as instructed by your health care provider. °¨ Eat soft foods for the first 24 hours. °SEEK MEDICAL CARE IF: °You have persistent spotting of blood in your stool 2-3 days after the procedure. °SEEK IMMEDIATE MEDICAL CARE IF: °· You have more than a small spotting of blood in your stool. °· You pass large blood clots in your stool. °· Your abdomen is swollen  (distended). °· You have nausea or vomiting. °· You have a fever. °· You have increasing abdominal pain that is not relieved with medicine. °  °This information is not intended to replace advice given to you by your health care provider. Make sure you discuss any questions you have with your health care provider. °  °Document Released: 06/03/2004 Document Revised: 08/10/2013 Document Reviewed: 06/27/2013 °Elsevier Interactive Patient Education ©2016 [employer] ° °

## 2015-10-02 NOTE — Anesthesia Postprocedure Evaluation (Signed)
Anesthesia Post Note  Patient: [name]  Procedure(s) Performed: Procedure(s) (LRB): COLONOSCOPY WITH PROPOFOL (N/A)  Patient location during evaluation: PACU Anesthesia Type: MAC Level of consciousness: awake and alert Pain management: pain level controlled Vital Signs Assessment: post-procedure vital signs reviewed and stable Respiratory status: spontaneous breathing, nonlabored ventilation, respiratory function stable and patient connected to nasal cannula oxygen Cardiovascular status: stable and blood pressure returned to baseline Anesthetic complications: no    Last Vitals:  Filed Vitals:   10/02/15 1104 10/02/15 1105  BP: 124/73   Pulse:  79  Temp:    Resp: 17 23    Last Pain: There were no vitals filed for this visit.               Nemesio Castrillon J

## 2015-10-03 ENCOUNTER — Encounter (HOSPITAL_COMMUNITY): Payer: Self-pay | Admitting: Gastroenterology

## 2015-11-30 ENCOUNTER — Other Ambulatory Visit: Payer: Self-pay

## 2015-11-30 MED ORDER — CELECOXIB 200 MG PO CAPS: 200.0000 mg | ORAL_CAPSULE | Freq: Two times a day (BID) | ORAL | Status: DC

## 2015-11-30 MED ORDER — HYDROCHLOROTHIAZIDE 12.5 MG PO CAPS: 12.5000 mg | ORAL_CAPSULE | ORAL | Status: DC

## 2015-12-21 ENCOUNTER — Ambulatory Visit: Payer: 59 | Admitting: Physician Assistant

## 2015-12-27 ENCOUNTER — Ambulatory Visit (INDEPENDENT_AMBULATORY_CARE_PROVIDER_SITE_OTHER): Payer: 59 | Admitting: Family Medicine

## 2015-12-27 ENCOUNTER — Encounter: Payer: Self-pay | Admitting: Family Medicine

## 2015-12-27 VITALS — BP 139/78 | HR 107 | Ht 60.0 in | Wt 298.0 lb

## 2015-12-27 DIAGNOSIS — I10 Essential (primary) hypertension: Secondary | ICD-10-CM | POA: Diagnosis not present

## 2015-12-27 DIAGNOSIS — J309 Allergic rhinitis, unspecified: Secondary | ICD-10-CM

## 2015-12-27 DIAGNOSIS — R635 Abnormal weight gain: Secondary | ICD-10-CM | POA: Diagnosis not present

## 2015-12-27 DIAGNOSIS — J0100 Acute maxillary sinusitis, unspecified: Secondary | ICD-10-CM | POA: Diagnosis not present

## 2015-12-27 DIAGNOSIS — R7301 Impaired fasting glucose: Secondary | ICD-10-CM | POA: Diagnosis not present

## 2015-12-27 DIAGNOSIS — J019 Acute sinusitis, unspecified: Secondary | ICD-10-CM | POA: Insufficient documentation

## 2015-12-27 LAB — BASIC METABOLIC PANEL
BUN: 10 mg/dL (ref 7–25)
CALCIUM: 9.2 mg/dL (ref 8.6–10.4)
CO2: 19 mmol/L — ABNORMAL LOW (ref 20–31)
Chloride: 103 mmol/L (ref 98–110)
Creat: 0.85 mg/dL (ref 0.50–1.05)
Glucose, Bld: 89 mg/dL (ref 65–99)
Potassium: 4.4 mmol/L (ref 3.5–5.3)
SODIUM: 135 mmol/L (ref 135–146)

## 2015-12-27 LAB — POCT GLYCOSYLATED HEMOGLOBIN (HGB A1C): HEMOGLOBIN A1C: 5.7

## 2015-12-27 MED ORDER — FLUTICASONE-SALMETEROL 250-50 MCG/DOSE IN AEPB
1.0000 | INHALATION_SPRAY | Freq: Two times a day (BID) | RESPIRATORY_TRACT | Status: DC
Start: 2015-12-27 — End: 2016-07-14

## 2015-12-27 MED ORDER — ALBUTEROL SULFATE HFA 108 (90 BASE) MCG/ACT IN AERS: 2.0000 | INHALATION_SPRAY | Freq: Four times a day (QID) | RESPIRATORY_TRACT | Status: DC | PRN

## 2015-12-27 MED ORDER — HYDROCHLOROTHIAZIDE 25 MG PO TABS: 25.0000 mg | ORAL_TABLET | Freq: Every day | ORAL | Status: DC

## 2015-12-27 MED ORDER — AZITHROMYCIN 250 MG PO TABS: ORAL_TABLET | ORAL | Status: DC

## 2015-12-27 MED ORDER — LIRAGLUTIDE -WEIGHT MANAGEMENT 18 MG/3ML ~~LOC~~ SOPN: 0.6000 mg | PEN_INJECTOR | Freq: Every day | SUBCUTANEOUS | Status: DC

## 2015-12-27 MED ORDER — ALBUTEROL SULFATE (2.5 MG/3ML) 0.083% IN NEBU: 2.5000 mg | INHALATION_SOLUTION | RESPIRATORY_TRACT | Status: DC | PRN

## 2015-12-27 MED ORDER — FLUTICASONE PROPIONATE 50 MCG/ACT NA SUSP: 2.0000 | NASAL | Status: DC | PRN

## 2015-12-27 MED ORDER — AMOXICILLIN-POT CLAVULANATE 875-125 MG PO TABS: 1.0000 | ORAL_TABLET | Freq: Two times a day (BID) | ORAL | Status: DC
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
## 2015-12-27 NOTE — Patient Instructions (Signed)

## 2015-12-27 NOTE — Assessment & Plan Note (Signed)
Diet and exercise. Inc HCTZ to 25mg .

## 2016-01-18 ENCOUNTER — Other Ambulatory Visit: Payer: Self-pay | Admitting: Family Medicine

## 2016-02-05 ENCOUNTER — Ambulatory Visit: Payer: 59 | Admitting: Family Medicine

## 2016-03-27 ENCOUNTER — Ambulatory Visit: Payer: 59 | Admitting: Family Medicine

## 2016-05-12 ENCOUNTER — Ambulatory Visit: Payer: 59 | Admitting: Family Medicine

## 2016-06-16 ENCOUNTER — Ambulatory Visit: Payer: 59 | Admitting: Family Medicine

## 2016-07-14 ENCOUNTER — Ambulatory Visit (INDEPENDENT_AMBULATORY_CARE_PROVIDER_SITE_OTHER): Payer: 59 | Admitting: Family Medicine

## 2016-07-14 ENCOUNTER — Encounter: Payer: Self-pay | Admitting: Family Medicine

## 2016-07-14 VITALS — BP 136/70 | HR 89 | Ht 60.0 in | Wt 309.0 lb

## 2016-07-14 DIAGNOSIS — M25562 Pain in left knee: Secondary | ICD-10-CM

## 2016-07-14 DIAGNOSIS — J4531 Mild persistent asthma with (acute) exacerbation: Secondary | ICD-10-CM

## 2016-07-14 DIAGNOSIS — J019 Acute sinusitis, unspecified: Secondary | ICD-10-CM

## 2016-07-14 DIAGNOSIS — R7301 Impaired fasting glucose: Secondary | ICD-10-CM

## 2016-07-14 DIAGNOSIS — M25561 Pain in right knee: Secondary | ICD-10-CM

## 2016-07-14 DIAGNOSIS — I10 Essential (primary) hypertension: Secondary | ICD-10-CM | POA: Diagnosis not present

## 2016-07-14 LAB — POCT GLYCOSYLATED HEMOGLOBIN (HGB A1C): HEMOGLOBIN A1C: 5.7

## 2016-07-14 MED ORDER — AMOXICILLIN-POT CLAVULANATE 875-125 MG PO TABS: 1.0000 | ORAL_TABLET | Freq: Two times a day (BID) | ORAL | 0 refills | Status: DC

## 2016-07-14 MED ORDER — NALTREXONE-BUPROPION HCL ER 8-90 MG PO TB12: 1.0000 | ORAL_TABLET | Freq: Two times a day (BID) | ORAL | 1 refills | Status: DC

## 2016-07-14 MED ORDER — ALBUTEROL SULFATE (2.5 MG/3ML) 0.083% IN NEBU: 2.5000 mg | INHALATION_SOLUTION | RESPIRATORY_TRACT | 6 refills | Status: DC | PRN

## 2016-07-14 MED ORDER — FLUTICASONE-SALMETEROL 250-50 MCG/DOSE IN AEPB: 1.0000 | INHALATION_SPRAY | Freq: Two times a day (BID) | RESPIRATORY_TRACT | 11 refills | Status: DC

## 2016-07-14 MED ORDER — PREDNISONE 20 MG PO TABS: 40.0000 mg | ORAL_TABLET | Freq: Every day | ORAL | 0 refills | Status: DC

## 2016-07-14 MED ORDER — CELECOXIB 200 MG PO CAPS: 200.0000 mg | ORAL_CAPSULE | Freq: Two times a day (BID) | ORAL | 1 refills | Status: DC

## 2016-07-14 MED ORDER — FLUTICASONE PROPIONATE 50 MCG/ACT NA SUSP: 2.0000 | NASAL | 6 refills | Status: DC | PRN

## 2016-07-14 MED ORDER — HYDROCHLOROTHIAZIDE 25 MG PO TABS: 25.0000 mg | ORAL_TABLET | Freq: Every day | ORAL | 3 refills | Status: DC

## 2016-07-14 MED ORDER — ALBUTEROL SULFATE HFA 108 (90 BASE) MCG/ACT IN AERS: 2.0000 | INHALATION_SPRAY | Freq: Four times a day (QID) | RESPIRATORY_TRACT | 6 refills | Status: DC | PRN

## 2016-07-14 NOTE — Progress Notes (Signed)
Subjective:    CC: HTN  HPI: Hypertension- Pt denies chest pain, SOB, dizziness, or heart palpitations.  Taking meds as directed w/o problems.  Denies medication side effects.    Abnormal weight gain - Unable to get Sexenda as it was not covered on her insurance plan. I did encourage her to call and find out what may or may not be covered.  Sinus congestion - She's had a lot of sinus pressure congestion and postnasal drip. She says she's been taking her Zyrtec as well as using her steroid nasal spray that feels like in the last week she's actually been getting worse. No fevers chills or sweats.  Feels like she is getting more headache and pressure and is feeling more tired and fatigued. She's actually had to use her nebulizer couple of times this week as well.  She is complaining of the fact that she feels her knees are getting worse. She says it's been a while since she's been with her orthopedist but says it's getting more difficult to walk. She is requesting that I fill out a handicap placard for her today. She really wants a prednisone burst to help with her knees. She says she has done this in the past and it was really helpful short-term.  Past medical history, Surgical history, Family history not pertinant except as noted below, Social history, Allergies, and medications have been entered into the medical record, reviewed, and corrections made.   Review of Systems: No fevers, chills, night sweats, weight loss, chest pain, or shortness of breath.   Objective:    General: Well Developed, well nourished, and in no acute distress.  Neuro: Alert and oriented x3, extra-ocular muscles intact, sensation grossly intact.  HEENT: Normocephalic, atraumatic, OP is clear, TMs and canals are clear bilaterally. No cervical lymphadenopathy. Skin: Warm and dry, no rashes. Cardiac: Regular rate and rhythm, no murmurs rubs or gallops, no lower extremity edema.  Respiratory: Clear to auscultation  bilaterally. Not using accessory muscles, speaking in full sentences.   Impression and Recommendations:   Abnormal weight gain - Unable to get Sexenda as it was not covered on her insurance plan. I did encourage her to call and find out what may or may not be covered. She did want to try Contrave so we'll send over new prescription though it may or may not be covered. Offered to refer her to a bariatric program. Nicki Reaper strategies to work on regular exercise and diet.  Acute sinusitis - Will treat with Augmentin and 5 days of prednisone.  Bilateral knee pain/osteoarthritis-given short-term oral five-day prednisone burst to explain that this is really going to provide any lasting relief for her. She would benefit from getting back in with her orthopedist and possibly having some therapy or injection especially if it's starting to affect her walking. She did ask for a handicap placard. I gave her one for the next 6 months until she can get back in with her orthopedist. They can better determine if she may need a long-term disability plaque.  Impaired fasting glucose - 11 A1c 5.7 today which is an improvement from previous. Continue work on diet and exercise. Follow-up in 6 months.  Asthma, mild persistant - Ok to use prednisone PRN. Will tx sinusitis which may be contributing to her sxs.

## 2016-07-15 ENCOUNTER — Telehealth: Payer: Self-pay | Admitting: *Deleted

## 2016-07-15 NOTE — Telephone Encounter (Signed)
Initiated PA through covermymeds and response was:  Information regarding your request  Drug is not covered by plan.

## 2016-07-16 MED ORDER — METFORMIN HCL ER 500 MG PO TB24: 500.0000 mg | ORAL_TABLET | Freq: Every day | ORAL | 5 refills | Status: DC

## 2016-07-16 NOTE — Telephone Encounter (Signed)
Call pt and let her know contrave not covered. She will have to call her insurance to find out if anything is covered.

## 2016-07-16 NOTE — Telephone Encounter (Signed)
Patient notified and wanted to know if she could go ahead and try metformin as she says you guys discussed

## 2016-07-16 NOTE — Telephone Encounter (Signed)
Ok pt notified that med will be sent over

## 2016-07-16 NOTE — Telephone Encounter (Signed)
No problem. Will send over to the pharm Beatrice Lecher, MD

## 2016-08-01 ENCOUNTER — Ambulatory Visit (INDEPENDENT_AMBULATORY_CARE_PROVIDER_SITE_OTHER): Payer: 59 | Admitting: Family Medicine

## 2016-08-01 ENCOUNTER — Encounter: Payer: Self-pay | Admitting: Family Medicine

## 2016-08-01 ENCOUNTER — Other Ambulatory Visit (HOSPITAL_COMMUNITY)
Admission: RE | Admit: 2016-08-01 | Discharge: 2016-08-01 | Disposition: A | Discharge status: Home / Self Care | Payer: 59 | Source: Ambulatory Visit | Attending: Family Medicine | Admitting: Family Medicine

## 2016-08-01 VITALS — BP 118/70 | HR 105 | Ht 60.0 in | Wt 308.0 lb

## 2016-08-01 DIAGNOSIS — Z0189 Encounter for other specified special examinations: Secondary | ICD-10-CM

## 2016-08-01 DIAGNOSIS — Z01419 Encounter for gynecological examination (general) (routine) without abnormal findings: Secondary | ICD-10-CM | POA: Insufficient documentation

## 2016-08-01 DIAGNOSIS — R7301 Impaired fasting glucose: Secondary | ICD-10-CM | POA: Diagnosis not present

## 2016-08-01 DIAGNOSIS — Z0000 Encounter for general adult medical examination without abnormal findings: Secondary | ICD-10-CM

## 2016-08-01 DIAGNOSIS — R319 Hematuria, unspecified: Secondary | ICD-10-CM

## 2016-08-01 LAB — POCT URINALYSIS DIPSTICK
Bilirubin, UA: NEGATIVE
Glucose, UA: NEGATIVE
Ketones, UA: NEGATIVE
LEUKOCYTES UA: NEGATIVE
NITRITE UA: NEGATIVE
PH UA: 7
PROTEIN UA: 30
Spec Grav, UA: 1.025
UROBILINOGEN UA: 1

## 2016-08-01 NOTE — Progress Notes (Signed)
   Subjective:     [name] is a 60 y.o. female and is here for a comprehensive physical exam. The patient reports no problems.  Social History   Social History  . Marital status: Married    Spouse name: N/A  . Number of children: N/A  . Years of education: N/A   Occupational History  . Not on file.   Social History Main Topics  . Smoking status: Never Smoker  . Smokeless tobacco: Never Used  . Alcohol use No  . Drug use: No  . Sexual activity: Yes   Other Topics Concern  . Not on file   Social History Narrative  . No narrative on file   Health Maintenance  Topic Date Due  . INFLUENZA VACCINE  11/02/2016 (Originally 06/03/2016)  . PAP SMEAR  09/14/2016  . MAMMOGRAM  09/07/2017  . COLONOSCOPY  10/01/2018  . TETANUS/TDAP  08/02/2024  . Hepatitis C Screening  Completed  . HIV Screening  Completed    The following portions of the patient's history were reviewed and updated as appropriate: allergies, current medications, past family history, past medical history, past social history, past surgical history and problem list.  Review of Systems A comprehensive review of systems was negative.   Objective:    There were no vitals taken for this visit. General appearance: alert, cooperative and appears stated age Head: Normocephalic, without obvious abnormality, atraumatic Eyes: conj clear, EOMi, PEERLA Ears: normal TM's and external ear canals both ears Nose: Nares normal. Septum midline. Mucosa normal. No drainage or sinus tenderness. Throat: lips, mucosa, and tongue normal; teeth and gums normal Neck: no adenopathy, no carotid bruit, no JVD, supple, symmetrical, trachea midline and thyroid not enlarged, symmetric, no tenderness/mass/nodules Back: symmetric, no curvature. ROM normal. No CVA tenderness. Lungs: clear to auscultation bilaterally Breasts: normal appearance, no masses or tenderness Heart: regular rate and rhythm, S1, S2 normal, no murmur, click, rub or  gallop Abdomen: soft, non-tender; bowel sounds normal; no masses,  no organomegaly Pelvic: cervix normal in appearance, external genitalia normal, no adnexal masses or tenderness, no cervical motion tenderness, rectovaginal septum normal, uterus normal size, shape, and consistency and vagina normal without discharge Extremities: extremities normal, atraumatic, no cyanosis or edema Pulses: 2+ and symmetric Skin: Skin color, texture, turgor normal. No rashes or lesions Lymph nodes: Cervical, supraclavicular, and axillary nodes normal. Neurologic: Alert and oriented X 3, normal strength and tone. Normal symmetric reflexes. Normal coordination and gait    Assessment:    Healthy female exam.      Plan:     See After Visit Summary for Counseling Recommendations    Keep up a regular exercise program and make sure you are eating a healthy diet Try to eat 4 servings of dairy a day, or if you are lactose intolerant take a calcium with vitamin D daily.  Your vaccines are up to date.  Due for mammogram in November.   Obesity/BMI 60 - she is doing well on the metformin. Feels like it has been curbing her appetite.  She has switched to the mini- soda and has been trying to watch her snacking.

## 2016-08-01 NOTE — Patient Instructions (Signed)
Keep up a regular exercise program and make sure you are eating a healthy diet Try to eat 4 servings of dairy a day, or if you are lactose intolerant take a calcium with vitamin D daily.  Your vaccines are up to date.   

## 2016-08-02 LAB — COMPLETE METABOLIC PANEL WITH GFR
ALBUMIN: 3.6 g/dL (ref 3.6–5.1)
ALK PHOS: 86 U/L (ref 33–130)
ALT: 11 U/L (ref 6–29)
AST: 15 U/L (ref 10–35)
BILIRUBIN TOTAL: 1 mg/dL (ref 0.2–1.2)
BUN: 12 mg/dL (ref 7–25)
CALCIUM: 9.1 mg/dL (ref 8.6–10.4)
CO2: 29 mmol/L (ref 20–31)
Chloride: 100 mmol/L (ref 98–110)
Creat: 1.02 mg/dL (ref 0.50–1.05)
GFR, EST AFRICAN AMERICAN: 70 mL/min (ref >=60)
GFR, EST NON AFRICAN AMERICAN: 60 mL/min (ref >=60)
Glucose, Bld: 99 mg/dL (ref 65–99)
POTASSIUM: 3.9 mmol/L (ref 3.5–5.3)
SODIUM: 140 mmol/L (ref 135–146)
TOTAL PROTEIN: 6.4 g/dL (ref 6.1–8.1)

## 2016-08-02 LAB — URINALYSIS, MICROSCOPIC ONLY
Bacteria, UA: NONE SEEN [HPF]
CASTS: NONE SEEN [LPF]
YEAST: NONE SEEN [HPF]

## 2016-08-02 LAB — LIPID PANEL
CHOL/HDL RATIO: 4.1 ratio (ref <=5.0)
CHOLESTEROL: 201 mg/dL — AB (ref 125–200)
HDL: 49 mg/dL (ref >=46)
LDL Cholesterol: 128 mg/dL (ref <130)
TRIGLYCERIDES: 119 mg/dL (ref <150)
VLDL: 24 mg/dL (ref <30)

## 2016-08-06 LAB — CYTOLOGY - PAP

## 2016-08-06 NOTE — Progress Notes (Signed)
Call patient: Your Pap smear is normal. Repeat in 3 years.

## 2016-09-30 ENCOUNTER — Ambulatory Visit: Payer: 59 | Admitting: Family Medicine

## 2016-10-18 LAB — HM MAMMOGRAPHY

## 2016-10-31 ENCOUNTER — Encounter: Payer: Self-pay | Admitting: Family Medicine

## 2017-01-15 ENCOUNTER — Telehealth: Payer: Self-pay | Admitting: *Deleted

## 2017-01-15 MED ORDER — FLUTICASONE PROPIONATE 50 MCG/ACT NA SUSP: 2.0000 | NASAL | 6 refills | Status: DC | PRN

## 2017-01-16 NOTE — Telephone Encounter (Signed)
Referral placed.[name]  

## 2017-02-18 ENCOUNTER — Ambulatory Visit: Payer: 59 | Admitting: Family Medicine

## 2017-04-17 ENCOUNTER — Encounter: Payer: Self-pay | Admitting: Family Medicine

## 2017-04-17 ENCOUNTER — Ambulatory Visit (INDEPENDENT_AMBULATORY_CARE_PROVIDER_SITE_OTHER): Payer: 59 | Admitting: Family Medicine

## 2017-04-17 VITALS — BP 134/91 | HR 85 | Ht 59.45 in | Wt 308.0 lb

## 2017-04-17 DIAGNOSIS — R7301 Impaired fasting glucose: Secondary | ICD-10-CM

## 2017-04-17 DIAGNOSIS — N898 Other specified noninflammatory disorders of vagina: Secondary | ICD-10-CM

## 2017-04-17 DIAGNOSIS — N950 Postmenopausal bleeding: Secondary | ICD-10-CM | POA: Diagnosis not present

## 2017-04-17 LAB — POCT GLYCOSYLATED HEMOGLOBIN (HGB A1C): Hemoglobin A1C: 5.8

## 2017-04-17 NOTE — Progress Notes (Signed)
Subjective:    Patient ID: [name], female    DOB: 1956-02-26, 61 y.o.   MRN: [id number]  HPI 61 yo postmenopausal female comes in today noticing some vaginal bleeding that started about a week ago. She says she has been taking more NSAIDs in usual and has been straining with bowel movements.She says initially it just slight like dark old blood and then last week it looked more bright red. She says it was always just a small amount on the toilet paper nothing that was actually soaking through her clothing. She reports that her last period was probably in her mid 39s.   Review of Systems   BP (!) 134/91   Pulse 85   Ht 4' 11.45" (1.51 m)   Wt (!) 308 lb (139.7 kg)   SpO2 97%   BMI 61.27 kg/m     Allergies  Allergen Reactions  . Augmentin [Amoxicillin-Pot Clavulanate] Diarrhea  . Phentermine     Numbness tingling/feels weird.     Past Medical History:  Diagnosis Date  . Allergy    seasonal  . Anemia   . Arthritis   . Asthma   . 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 Blood transfusion without reported diagnosis    1980's  . Colon cancer ([place]) 2009   colon  . GERD (gastroesophageal reflux disease)   . Hiatal hernia   . Hypertension     Past Surgical History:  Procedure Laterality Date  . [place]  . CHOLECYSTECTOMY  1980  . COLONOSCOPY    . COLONOSCOPY WITH PROPOFOL N/A 10/02/2015   Procedure: COLONOSCOPY WITH PROPOFOL;  Surgeon: Ladene Artist, MD;  Location: [facility];  Service: Endoscopy;  Laterality: N/A;  . PARTIAL COLECTOMY  2009  . POLYPECTOMY    . TUBAL LIGATION  1989    Social History   Social History  . Marital status: Married    Spouse name: N/A  . Number of children: N/A  . Years of education: N/A   Occupational History  . Not on file.   Social History Main Topics  . Smoking status: Never Smoker  . Smokeless tobacco: Never Used  . Alcohol use No  . Drug use: No  . Sexual activity: Yes    Other Topics Concern  . Not on file   Social History Narrative  . No narrative on file    Family History  Problem Relation Age of Onset  . Colon cancer Neg Hx   . Stomach cancer Neg Hx   . Hypertension Father   . Stroke Paternal Grandmother   . Hypertension Paternal Grandfather     Outpatient Encounter Prescriptions as of 04/17/2017  Medication Sig  . albuterol (PROVENTIL HFA;VENTOLIN HFA) 108 (90 Base) MCG/ACT inhaler Inhale 2 puffs into the lungs every 6 (six) hours as needed for wheezing or shortness of breath.  Marland Kitchen albuterol (PROVENTIL) (2.5 MG/3ML) 0.083% nebulizer solution Take 3 mLs (2.5 mg total) by nebulization every 4 (four) hours as needed for wheezing or shortness of breath (please include nebulizer machine, hoses, and mask if needed.).  Marland Kitchen celecoxib (CELEBREX) 200 MG capsule Take 1 capsule (200 mg total) by mouth 2 (two) times daily.  . cetirizine (ZYRTEC) 10 MG tablet Take 10 mg by mouth daily.   . Cholecalciferol (VITAMIN D) 2000 UNITS CAPS Take 2,000 Units by mouth daily.  . fluticasone (FLONASE) 50 MCG/ACT nasal spray Place 2 sprays into both  nostrils as needed for allergies or rhinitis.  . Fluticasone-Salmeterol (ADVAIR DISKUS) 250-50 MCG/DOSE AEPB Inhale 1 puff into the lungs 2 (two) times daily.  Marland Kitchen glucosamine-chondroitin 500-400 MG tablet Take 1 tablet by mouth daily.   . hydrochlorothiazide (HYDRODIURIL) 25 MG tablet Take 1 tablet (25 mg total) by mouth daily.  . metFORMIN (GLUCOPHAGE-XR) 500 MG 24 hr tablet Take 1 tablet (500 mg total) by mouth daily with breakfast.  . traMADol (ULTRAM) 50 MG tablet Take 1 tablet (50 mg total) by mouth every 6 (six) hours as needed. Takes 1/2 tablet (Patient taking differently: Take 25 mg by mouth every 6 (six) hours as needed. )  . [DISCONTINUED] Naltrexone-Bupropion HCl ER (CONTRAVE) 8-90 MG TB12 Take 1 tablet by mouth 2 (two) times daily.   No facility-administered encounter medications on file as of 04/17/2017.          Objective:   Physical Exam  Constitutional: She is oriented to person, place, and time. She appears well-developed and well-nourished.  HENT:  Head: Normocephalic and atraumatic.  Eyes: Conjunctivae and EOM are normal.  Cardiovascular: Normal rate.   Pulmonary/Chest: Effort normal.  Genitourinary: There is no rash, tenderness or lesion on the right labia. There is no rash, tenderness or lesion on the left labia. Cervix exhibits no motion tenderness and no friability. There is erythema in the vagina. No tenderness in the vagina. No foreign body in the vagina. No signs of injury around the vagina. No vaginal discharge found.  Genitourinary Comments: There is blood at the cervical os that is old and brown.    Neurological: She is alert and oriented to person, place, and time.  Skin: Skin is dry. No pallor.  Psychiatric: She has a normal mood and affect. Her behavior is normal.  Vitals reviewed.      Assessment & Plan:  Postmenopausal bleeding-A son exam I did verify blood at the cervical os. Recent Pap smear in September was normal. We did do a wet prep just to rule out any bacterial vaginitis and she has noticed a vaginal odor as well. Will schedule her for pelvic ultrasound and refer to GYN for endometrial biopsy.

## 2017-04-18 LAB — WET PREP, GENITAL
Trich, Wet Prep: NONE SEEN
Yeast Wet Prep HPF POC: NONE SEEN

## 2017-04-20 MED ORDER — METRONIDAZOLE 500 MG PO TABS: 500.0000 mg | ORAL_TABLET | Freq: Two times a day (BID) | ORAL | 0 refills | Status: DC

## 2017-04-20 NOTE — Progress Notes (Signed)
Call pt: Wet prep does show some BV. Ok to send metronidazole 500mg  bid for 7 days #14 NRF.

## 2017-04-20 NOTE — Addendum Note (Signed)
Addended by: Teddy Spike on: 04/20/2017 01:52 PM   Modules accepted: Orders

## 2017-04-24 ENCOUNTER — Other Ambulatory Visit: Payer: Self-pay

## 2017-04-24 MED ORDER — METRONIDAZOLE 500 MG PO TABS: 500.0000 mg | ORAL_TABLET | Freq: Two times a day (BID) | ORAL | 0 refills | Status: DC

## 2017-04-30 ENCOUNTER — Ambulatory Visit (INDEPENDENT_AMBULATORY_CARE_PROVIDER_SITE_OTHER): Payer: 59

## 2017-04-30 DIAGNOSIS — N83201 Unspecified ovarian cyst, right side: Secondary | ICD-10-CM | POA: Diagnosis not present

## 2017-04-30 DIAGNOSIS — N888 Other specified noninflammatory disorders of cervix uteri: Secondary | ICD-10-CM

## 2017-04-30 DIAGNOSIS — N950 Postmenopausal bleeding: Secondary | ICD-10-CM

## 2017-05-07 ENCOUNTER — Ambulatory Visit (INDEPENDENT_AMBULATORY_CARE_PROVIDER_SITE_OTHER): Payer: 59

## 2017-05-07 ENCOUNTER — Ambulatory Visit (INDEPENDENT_AMBULATORY_CARE_PROVIDER_SITE_OTHER): Payer: 59 | Admitting: Family Medicine

## 2017-05-07 ENCOUNTER — Encounter: Payer: Self-pay | Admitting: Family Medicine

## 2017-05-07 VITALS — BP 139/69 | HR 103 | Resp 16 | Wt 309.0 lb

## 2017-05-07 DIAGNOSIS — M25551 Pain in right hip: Secondary | ICD-10-CM

## 2017-05-07 MED ORDER — PREDNISONE 20 MG PO TABS: 40.0000 mg | ORAL_TABLET | Freq: Every day | ORAL | 0 refills | Status: DC

## 2017-05-07 MED ORDER — TIZANIDINE HCL 4 MG PO TABS: 4.0000 mg | ORAL_TABLET | Freq: Every evening | ORAL | 0 refills | Status: DC | PRN

## 2017-05-07 NOTE — Progress Notes (Signed)
Subjective:    Patient ID: [name], female    DOB: 03/28/1956, 61 y.o.   MRN: [id number]  HPI 61-year-old morbidly obese female comes in today complaining of right hip pain that's been present for approximately 2 weeks. She says she feels like a tightness in her mid thigh that wraps around to the outside of her hip and also goes up into the groin crease. She denies any known injury or trauma.  She says it's worse when she sits it puts pressure on the area. It's painful to sleep on that side. It's painful with lots of walking. She has been trying to ice it and using some type of cooling sad almost like Biofreeze. She says she did try taking her tramadol Celebrex and a muscle relaxer all at one time and after about 2 hours finally got relief for a few hours but then went back up in the middle of night with pain.   Review of Systems   BP 139/69   Pulse (!) 103   Resp 16   Wt (!) 309 lb (140.2 kg)   BMI 61.47 kg/m     Allergies  Allergen Reactions  . Augmentin [Amoxicillin-Pot Clavulanate] Diarrhea  . Phentermine     Numbness tingling/feels weird.     Past Medical History:  Diagnosis Date  . Allergy    seasonal  . Anemia   . Arthritis   . Asthma   . 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 Blood transfusion without reported diagnosis    1980's  . Colon cancer ([place]) 2009   colon  . GERD (gastroesophageal reflux disease)   . Hiatal hernia   . Hypertension     Past Surgical History:  Procedure Laterality Date  . [place]  . CHOLECYSTECTOMY  1980  . COLONOSCOPY    . COLONOSCOPY WITH PROPOFOL N/A 10/02/2015   Procedure: COLONOSCOPY WITH PROPOFOL;  Surgeon: Ladene Artist, MD;  Location: [facility];  Service: Endoscopy;  Laterality: N/A;  . PARTIAL COLECTOMY  2009  . POLYPECTOMY    . TUBAL LIGATION  1989    Social History   Social History  . Marital status: Married    Spouse name: N/A  . Number of children: N/A  . Years of  education: N/A   Occupational History  . Not on file.   Social History Main Topics  . Smoking status: Never Smoker  . Smokeless tobacco: Never Used  . Alcohol use No  . Drug use: No  . Sexual activity: Yes   Other Topics Concern  . Not on file   Social History Narrative  . No narrative on file    Family History  Problem Relation Age of Onset  . Colon cancer Neg Hx   . Stomach cancer Neg Hx   . Hypertension Father   . Stroke Paternal Grandmother   . Hypertension Paternal Grandfather     Outpatient Encounter Prescriptions as of 05/07/2017  Medication Sig  . albuterol (PROVENTIL HFA;VENTOLIN HFA) 108 (90 Base) MCG/ACT inhaler Inhale 2 puffs into the lungs every 6 (six) hours as needed for wheezing or shortness of breath.  Marland Kitchen albuterol (PROVENTIL) (2.5 MG/3ML) 0.083% nebulizer solution Take 3 mLs (2.5 mg total) by nebulization every 4 (four) hours as needed for wheezing or shortness of breath (please include nebulizer machine, hoses, and mask if needed.).  Marland Kitchen celecoxib (CELEBREX) 200 MG capsule Take 1 capsule (200 mg total)  by mouth 2 (two) times daily.  . cetirizine (ZYRTEC) 10 MG tablet Take 10 mg by mouth daily.   . Cholecalciferol (VITAMIN D) 2000 UNITS CAPS Take 2,000 Units by mouth daily.  . fluticasone (FLONASE) 50 MCG/ACT nasal spray Place 2 sprays into both nostrils as needed for allergies or rhinitis.  . Fluticasone-Salmeterol (ADVAIR DISKUS) 250-50 MCG/DOSE AEPB Inhale 1 puff into the lungs 2 (two) times daily.  Marland Kitchen glucosamine-chondroitin 500-400 MG tablet Take 1 tablet by mouth daily.   . hydrochlorothiazide (HYDRODIURIL) 25 MG tablet Take 1 tablet (25 mg total) by mouth daily.  . metFORMIN (GLUCOPHAGE-XR) 500 MG 24 hr tablet Take 1 tablet (500 mg total) by mouth daily with breakfast.  . metroNIDAZOLE (FLAGYL) 500 MG tablet Take 1 tablet (500 mg total) by mouth 2 (two) times daily.  . predniSONE (DELTASONE) 20 MG tablet Take 2 tablets (40 mg total) by mouth daily.  Marland Kitchen  tiZANidine (ZANAFLEX) 4 MG tablet Take 1 tablet (4 mg total) by mouth at bedtime as needed for muscle spasms. OK to substitute caps if cheaper.  . traMADol (ULTRAM) 50 MG tablet Take 1 tablet (50 mg total) by mouth every 6 (six) hours as needed. Takes 1/2 tablet (Patient taking differently: Take 25 mg by mouth every 6 (six) hours as needed. )   No facility-administered encounter medications on file as of 05/07/2017.           Objective:   Physical Exam  Constitutional: She is oriented to person, place, and time. She appears well-developed and well-nourished.  HENT:  Head: Normocephalic and atraumatic.  Eyes: Conjunctivae and EOM are normal.  Cardiovascular: Normal rate.   Pulmonary/Chest: Effort normal.  Musculoskeletal:  Right with NROM. She has some pain with internal rotation of the hip.  Hip, knee, ankle strength is 5 out of 5 bilaterally. She is tender over the greater trochanter. Nontender over the lumbar spine and nontender over the SI joints.  Neurological: She is alert and oriented to person, place, and time.  Skin: Skin is dry. No pallor.  Psychiatric: She has a normal mood and affect. Her behavior is normal.  Vitals reviewed.       Assessment & Plan:  Right hip pain-based on her description of pain I suspect that she probably has some osteoporosis arthritis in addition to bursitis. She has pain over the groin crease and pain over the lateral portion of the hip. Recommend x-rays for further evaluation. She wanted to try a short course of prednisone to see if this helped since she really doesn't like taking the tramadol and Celebrex really isn't helping. She says the tramadol constipates her so she really dislikes using it rarely. She may benefit from some home stretches therapy/physical therapy versus maybe even an injection but I will get the x-rays back first and go from there. She also like a refill on a muscle relaxer. She would like try Zanaflex. She things she's taken in  the past would like to try again and said the Flexeril.

## 2017-05-13 ENCOUNTER — Other Ambulatory Visit (HOSPITAL_COMMUNITY)
Admission: RE | Admit: 2017-05-13 | Discharge: 2017-05-13 | Disposition: A | Discharge status: Home / Self Care | Payer: 59 | Source: Ambulatory Visit | Attending: Obstetrics & Gynecology | Admitting: Obstetrics & Gynecology

## 2017-05-13 ENCOUNTER — Ambulatory Visit (INDEPENDENT_AMBULATORY_CARE_PROVIDER_SITE_OTHER): Payer: 59 | Admitting: Obstetrics & Gynecology

## 2017-05-13 ENCOUNTER — Encounter: Payer: Self-pay | Admitting: Obstetrics & Gynecology

## 2017-05-13 VITALS — BP 134/84 | HR 82 | Ht 59.0 in | Wt 310.0 lb

## 2017-05-13 DIAGNOSIS — N949 Unspecified condition associated with female genital organs and menstrual cycle: Secondary | ICD-10-CM

## 2017-05-13 DIAGNOSIS — N939 Abnormal uterine and vaginal bleeding, unspecified: Secondary | ICD-10-CM | POA: Diagnosis present

## 2017-05-13 DIAGNOSIS — N9489 Other specified conditions associated with female genital organs and menstrual cycle: Secondary | ICD-10-CM

## 2017-05-13 NOTE — Progress Notes (Addendum)
History:  61 y.o. G3P3000 here today for eval pf PMPB. LMP >5-10 years prev. Pt reports that it looks like old blood and had an odor.  Pt walked a lot the week it started. She was seen by primary care and was treated for BV. Dr. Wynona Meals, at [facility], who noted blood from the cervix.  Pt noted that her bleeding resolved but, restarted today and yesterday    The following portions of the patient's history were reviewed and updated as appropriate: allergies, current medications, past family history, past medical history, past social history, past surgical history and problem list.  Review of Systems:  Pertinent items are noted in HPI.   Objective:  Physical Exam Weight (!) 310 lb (140.6 kg). BP 134/84 (BP Location: Right Arm, Patient Position: Sitting)   Pulse 82   Ht 4\' 11"  (1.499 m)   Wt (!) 310 lb (140.6 kg)   BMI 62.61 kg/m   CONSTITUTIONAL: Well-developed, well-nourished female in no acute distress.  HENT:  Normocephalic, atraumatic EYES: Conjunctivae and EOM are normal. No scleral icterus.  NECK: Normal range of motion SKIN: Skin is warm and dry. No rash noted. Not diaphoretic.No pallor. [place]: Alert and oriented to person, place, and time. Normal coordination.   The indications for endometrial biopsy were reviewed.   Risks of the biopsy including cramping, bleeding, infection, uterine perforation, inadequate specimen and need for additional procedures  were discussed. The patient states she understands and agrees to undergo procedure today. Consent was signed. Time out was performed. Urine HCG was negative. A sterile speculum was placed in the patient's vagina and the cervix was prepped with Betadine. A single-toothed tenaculum was placed on the anterior lip of the cervix to stabilize it. The 3 mm pipelle was introduced into the endometrial cavity without difficulty to a depth of 11cm, and a moderate amount of tissue was obtained and sent to pathology. The instruments were  removed from the patient's vagina. Minimal bleeding from the cervix was noted. The patient tolerated the procedure well. Routine post-procedure instructions were given to the patient. The patient will follow up to review the results and for further management.     Labs and Imaging US Transvaginal Non-ob  Result Date: 04/30/2017 CLINICAL DATA:  Postmenopausal bleeding. EXAM: TRANSABDOMINAL AND TRANSVAGINAL ULTRASOU complex cysts. ND OF PELVIS TECHNIQUE: Both transabdominal and transvaginal ultrasound examinations of the pelvis were performed. Transabdominal technique was performed for global imaging of the pelvis including uterus, ovaries, adnexal regions, and pelvic cul-de-sac. It was necessary to proceed with endovaginal exam following the transabdominal exam to visualize the uterus and ovaries. COMPARISON:  No recent prior . FINDINGS: Uterus Measurements: 8.5 x 4.2 x 5.6 cm. No fibroids noted. Multiple cysts in the region of the cervix most likely nabothian cysts. Endometrium Thickness: 10.6 cm.  No focal abnormality visualized. Right ovary Measurements: 3.0 x 1.9 x 3.0 cm. 2.0 x 1.8 x 2.0 complex cyst. Left ovary Not visualized.  Normal appearance/no adnexal mass. Other findings No abnormal free fluid. IMPRESSION: 1. 2.0 cm complex cyst right ovary. Cystic ovarian tumor cannot be completely excluded. Gynecologic evaluation suggested. 2. Left ovary not visualized. Multiple small cysts in the cervix suggesting nabothian cysts. Electronically Signed   By: Marcello Moores  Register   On: 04/30/2017 11:49   US Pelvis Complete  Result Date: 04/30/2017 CLINICAL DATA:  Postmenopausal bleeding. EXAM: TRANSABDOMINAL AND TRANSVAGINAL ULTRASOU complex cysts. ND OF PELVIS TECHNIQUE: Both transabdominal and transvaginal ultrasound examinations of the pelvis were performed. Transabdominal technique  was performed for global imaging of the pelvis including uterus, ovaries, adnexal regions, and pelvic cul-de-sac. It was necessary  to proceed with endovaginal exam following the transabdominal exam to visualize the uterus and ovaries. COMPARISON:  No recent prior . FINDINGS: Uterus Measurements: 8.5 x 4.2 x 5.6 cm. No fibroids noted. Multiple cysts in the region of the cervix most likely nabothian cysts. Endometrium Thickness: 10.6 cm.  No focal abnormality visualized. Right ovary Measurements: 3.0 x 1.9 x 3.0 cm. 2.0 x 1.8 x 2.0 complex cyst. Left ovary Not visualized.  Normal appearance/no adnexal mass. Other findings No abnormal free fluid. IMPRESSION: 1. 2.0 cm complex cyst right ovary. Cystic ovarian tumor cannot be completely excluded. Gynecologic evaluation suggested. 2. Left ovary not visualized. Multiple small cysts in the cervix suggesting nabothian cysts. Electronically Signed   By: Marcello Moores  Register   On: 04/30/2017 11:49   Dg Hip Unilat With Pelvis 2-3 Views Right  Result Date: 05/07/2017 CLINICAL DATA:  Patient c/o severe right hip pains with any sort of movement x few weeks, denies any known injury, no other complaints EXAM: DG HIP (WITH OR WITHOUT PELVIS) 2-3V RIGHT COMPARISON:  None. FINDINGS: There is no evidence of hip fracture or dislocation. Degenerative changes are seen in the lumbar spine. IMPRESSION: No evidence for acute  abnormality. Electronically Signed   By: Nolon Nations M.D.   On: 05/07/2017 13:05    Assessment & Plan:  PMPB. thickened endometrium Complex ov cyst- 2 cm  Suspect benign but, as pt is PMP will further eval.   surg path F/u 1 week CA125 and OVA 1. Needs f/u US to check for resolution or stability of cyst in 3 months     Tarae Wooden L. Harraway-Smith, M.D., Cherlynn June

## 2017-05-13 NOTE — Progress Notes (Signed)
Has had ultrasound done on 04-30-17. Recently treated for bacterial vaginosis.   Patient noticed some spotting yesterday and today. Kathrene Alu RNBSN

## 2017-05-14 ENCOUNTER — Encounter: Payer: Self-pay | Admitting: Family Medicine

## 2017-05-14 ENCOUNTER — Ambulatory Visit (INDEPENDENT_AMBULATORY_CARE_PROVIDER_SITE_OTHER): Payer: 59 | Admitting: Family Medicine

## 2017-05-14 VITALS — BP 137/80 | HR 89 | Wt 312.0 lb

## 2017-05-14 DIAGNOSIS — M170 Bilateral primary osteoarthritis of knee: Secondary | ICD-10-CM | POA: Diagnosis not present

## 2017-05-14 DIAGNOSIS — M7061 Trochanteric bursitis, right hip: Secondary | ICD-10-CM

## 2017-05-14 NOTE — Patient Instructions (Signed)
Thank you for coming in today. Attend PT.  Work on home exercises.  Recheck in 6 weeks.  Return sooner if needed.   You will hear about the knee injection soon.

## 2017-05-14 NOTE — Progress Notes (Signed)
[name] is a 61 y.o. female who presents to [facility] today for hip and back pain.  Patient states that the hip and back pain began about a month ago and cannot recall any inciting event. The pain is localized to the lower back and buttocks region on the right with some pain and tightness in the hip, lateral thigh, and groin. She characterizes the pain as mostly achy with occasional thigh muscle spasms. The pain occasionally wakes her from sleep. Patient also presented on 05/07/2017 with similar pains and states that the prednisone she received at that time helped alleviate the pain. She is unable to sit comfortably on her right side due to tenderness and states that it is difficult to rise from a chair or go up stairs due to pain in that area. Patient works remotely as a Teacher, adult education for an [employer] and spends significant hours throughout the day sitting. No radiating pain weakness or numbness bowel bladder dysfunction. No fevers or chills vomiting or diarrhea.   Past Medical History:  Diagnosis Date  . Allergy    seasonal  . Anemia   . Arthritis   . Asthma   . 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 Blood transfusion without reported diagnosis    1980's  . Colon cancer ([place]) 2009   colon  . GERD (gastroesophageal reflux disease)   . Hiatal hernia   . Hypertension    Past Surgical History:  Procedure Laterality Date  . [place]  . CHOLECYSTECTOMY  1980  . COLONOSCOPY    . COLONOSCOPY WITH PROPOFOL N/A 10/02/2015   Procedure: COLONOSCOPY WITH PROPOFOL;  Surgeon: Ladene Artist, MD;  Location: [facility] ENDOSCOPY;  Service: Endoscopy;  Laterality: N/A;  . PARTIAL COLECTOMY  2009  . POLYPECTOMY    . TUBAL LIGATION  1989   Social History  Substance Use Topics  . Smoking status: Never Smoker  . Smokeless tobacco: Never Used  . Alcohol use No     ROS:  As  above   Medications: Current Outpatient Prescriptions  Medication Sig Dispense Refill  . albuterol (PROVENTIL HFA;VENTOLIN HFA) 108 (90 Base) MCG/ACT inhaler Inhale 2 puffs into the lungs every 6 (six) hours as needed for wheezing or shortness of breath. 1 Inhaler 6  . albuterol (PROVENTIL) (2.5 MG/3ML) 0.083% nebulizer solution Take 3 mLs (2.5 mg total) by nebulization every 4 (four) hours as needed for wheezing or shortness of breath (please include nebulizer machine, hoses, and mask if needed.). 30 vial 6  . celecoxib (CELEBREX) 200 MG capsule Take 1 capsule (200 mg total) by mouth 2 (two) times daily. 180 capsule 1  . cetirizine (ZYRTEC) 10 MG tablet Take 10 mg by mouth daily.     . Cholecalciferol (VITAMIN D) 2000 UNITS CAPS Take 2,000 Units by mouth daily.    . fluticasone (FLONASE) 50 MCG/ACT nasal spray Place 2 sprays into both nostrils as needed for allergies or rhinitis. 16 g 6  . Fluticasone-Salmeterol (ADVAIR DISKUS) 250-50 MCG/DOSE AEPB Inhale 1 puff into the lungs 2 (two) times daily. 1 each 11  . glucosamine-chondroitin 500-400 MG tablet Take 1 tablet by mouth daily.     . hydrochlorothiazide (HYDRODIURIL) 25 MG tablet Take 1 tablet (25 mg total) by mouth daily. 90 tablet 3  . metFORMIN (GLUCOPHAGE-XR) 500 MG 24 hr tablet Take 1 tablet (500 mg total) by mouth daily with breakfast. 30  tablet 5  . metroNIDAZOLE (FLAGYL) 500 MG tablet Take 1 tablet (500 mg total) by mouth 2 (two) times daily. 14 tablet 0  . predniSONE (DELTASONE) 20 MG tablet Take 2 tablets (40 mg total) by mouth daily. 10 tablet 0  . tiZANidine (ZANAFLEX) 4 MG tablet Take 1 tablet (4 mg total) by mouth at bedtime as needed for muscle spasms. OK to substitute caps if cheaper. 30 tablet 0  . traMADol (ULTRAM) 50 MG tablet Take 1 tablet (50 mg total) by mouth every 6 (six) hours as needed. Takes 1/2 tablet (Patient taking differently: Take 25 mg by mouth every 6 (six) hours as needed. ) 120 tablet 1   No current  facility-administered medications for this visit.    Allergies  Allergen Reactions  . Augmentin [Amoxicillin-Pot Clavulanate] Diarrhea  . Phentermine     Numbness tingling/feels weird.      Exam:  BP 137/80   Pulse 89   Wt (!) 312 lb (141.5 kg)   SpO2 97%   BMI 63.02 kg/m  General: Well Developed, well nourished, and in no acute distress.  Neuro/Psych: Alert and oriented x3, extra-ocular muscles intact, able to move all 4 extremities, sensation grossly intact. Skin: Warm and dry, no rashes noted.  Respiratory: Not using accessory muscles, speaking in full sentences, trachea midline.  Cardiovascular: Pulses palpable, no extremity edema. Abdomen: Does not appear distended. MSK: Right hip appears normal on inspection. Hip has normal range of motion  Tenderness to palpation in right buttock at site of greater trochanter Increased pain with hip extension and hip abduction on right Strength 4/5 abduction.   Hip greater trochanteric injection: Right Consent obtained and timeout performed. Area of maximum tenderness palpated and identified. Skin cleaned with alcohol, cold spray applied. A spinal needle was used to access the greater trochanteric bursa. 80 mg of Depo-Medrol, and 4 mL of Marcaine were used to inject the trochanteric bursa. Patient tolerated the procedure well.    Study Result   CLINICAL DATA:  Patient c/o severe right hip pains with any sort of movement x few weeks, denies any known injury, no other complaints  EXAM: DG HIP (WITH OR WITHOUT PELVIS) 2-3V RIGHT  COMPARISON:  None.  FINDINGS: There is no evidence of hip fracture or dislocation. Degenerative changes are seen in the lumbar spine.  IMPRESSION: No evidence for acute  abnormality.   Electronically Signed   By: Nolon Nations M.D.   On: 05/07/2017 13:05        Assessment and Plan: 61 y.o. female with hip and back pain for approximately 1 month. Given her characterization and  location of her pain, physical exam, and previous symptomatic relief with prednisone, this is most likely greater trochanteric bursitis. She will benefit the most from physical therapy. This was discussed with the patient and she will plan to attend physical therapy once a week. Patient also acknowledged how weight loss will help many of the pains she feels in her back, hips, and knees. A steroid injection in the right hip was given today to help with the pain. It was discussed that this injection will only provide temporary relief and it was stressed that most of the resolution for this pain will come from physical therapy.   Patient also asked about receiving her visco supplimentation knee injections in clinic and she was told that her insurance will be contacted for approval. She has done well with Synvisc one in the past. Her last injection was over 6 months  ago. She will be contacted and these injections will be further discussed with her after this is done.   Orders Placed This Encounter  Procedures  . Ambulatory referral to Physical Therapy    Referral Priority:   Routine    Referral Type:   Physical Medicine    Referral Reason:   Specialty Services Required    Requested Specialty:   Physical Therapy   No orders of the defined types were placed in this encounter.   Discussed warning signs or symptoms. Please see discharge instructions. Patient expresses understanding.

## 2017-05-15 ENCOUNTER — Telehealth: Payer: Self-pay | Admitting: *Deleted

## 2017-05-15 ENCOUNTER — Encounter: Payer: Self-pay | Admitting: Obstetrics & Gynecology

## 2017-05-15 NOTE — Telephone Encounter (Signed)
orthovisc has been submitted with the following outcome:  We are unable to deliver a complete coverage response for this patient. A manual benefit verification request was automatically submitted for this response on 05/15/2017 and is currently in progress. We will notify you when your response is ready for review - usually within one to two business days.Maryruth Eve, [place] Crocker

## 2017-05-15 NOTE — Telephone Encounter (Signed)
Called pt to get her husband's DOB to complete orthovisc form. lvm asking for rtn call with this information.[name] [place]

## 2017-05-15 NOTE — Telephone Encounter (Signed)
Pt left vm with husband's dob.  It's [birth year].

## 2017-05-21 ENCOUNTER — Encounter (INDEPENDENT_AMBULATORY_CARE_PROVIDER_SITE_OTHER): Payer: Self-pay

## 2017-05-21 ENCOUNTER — Ambulatory Visit: Payer: 59 | Attending: Family Medicine | Admitting: Physical Therapy

## 2017-05-21 DIAGNOSIS — M25551 Pain in right hip: Secondary | ICD-10-CM

## 2017-05-21 DIAGNOSIS — M545 Low back pain: Secondary | ICD-10-CM | POA: Diagnosis present

## 2017-05-21 DIAGNOSIS — R262 Difficulty in walking, not elsewhere classified: Secondary | ICD-10-CM

## 2017-05-21 DIAGNOSIS — M6281 Muscle weakness (generalized): Secondary | ICD-10-CM | POA: Insufficient documentation
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
## 2017-05-25 ENCOUNTER — Ambulatory Visit: Payer: 59 | Admitting: Physical Therapy

## 2017-05-25 DIAGNOSIS — M6281 Muscle weakness (generalized): Secondary | ICD-10-CM

## 2017-05-25 DIAGNOSIS — M545 Low back pain: Secondary | ICD-10-CM

## 2017-05-25 DIAGNOSIS — R262 Difficulty in walking, not elsewhere classified: Secondary | ICD-10-CM

## 2017-05-25 DIAGNOSIS — M25551 Pain in right hip: Secondary | ICD-10-CM | POA: Diagnosis not present

## 2017-05-25 NOTE — Patient Instructions (Signed)

## 2017-05-25 NOTE — Therapy (Signed)
[facility] [address]  [place], [place], [address] Phone: [phone]   Fax:  [phone]  Physical Therapy Treatment  Patient Details  Name: [name] MRN: [id number] Date of Birth: Sep 25, 1956 Referring Provider: Gregor Hams, MD  Encounter Date: 05/25/2017      PT End of Session - 05/25/17 0801    Visit Number 2   Number of Visits 12   Date for PT Re-Evaluation 07/03/17   Authorization Type [employer] & [employer]   PT Start Time 0801   PT Stop Time 0851   PT Time Calculation (min) 50 min   Activity Tolerance Patient tolerated treatment well   Behavior During Therapy [facility] for tasks assessed/performed      Past Medical History:  Diagnosis Date  . Allergy    seasonal  . Anemia   . Arthritis   . Asthma   . 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 Blood transfusion without reported diagnosis    1980's  . Colon cancer ([place]) 2009   colon  . GERD (gastroesophageal reflux disease)   . Hiatal hernia   . Hypertension     Past Surgical History:  Procedure Laterality Date  . [place]  . CHOLECYSTECTOMY  1980  . COLONOSCOPY    . COLONOSCOPY WITH PROPOFOL N/A 10/02/2015   Procedure: COLONOSCOPY WITH PROPOFOL;  Surgeon: Ladene Artist, MD;  Location: [facility];  Service: Endoscopy;  Laterality: N/A;  . PARTIAL COLECTOMY  2009  . POLYPECTOMY    . TUBAL LIGATION  1989    There were no vitals filed for this visit.      Subjective Assessment - 05/25/17 0804    Subjective Pt reporting feeling nauseated due to her sinuses. Did not take any pain meds due to this.   Patient Stated Goals "Be able to walk longer distances and stand for longer periods."   Currently in Pain? Yes   Pain Score 5    Pain Location Back   Pain Orientation Lower                         [facility] Adult PT Treatment/Exercise - 05/25/17 0801      Self-Care   Self-Care Posture   Posture Provided posture & body mechanics handout, emphasizing log rolling and sidelying to sit, worsk station set-up and good body mechanics for daily chores/tasks     Lumbar Exercises: Stretches   ITB Stretch Both;30 seconds;2 reps   ITB Stretch Limitations supine with strap   Piriformis Stretch Both;30 seconds;2 reps   Piriformis Stretch Limitations supine figure 4 with strap     Lumbar Exercises: Supine   Ab Set 10 reps;5 seconds   Clam 10 reps;3 seconds   Clam Limitations hooklying alt hip ABD/ER with green TB   Bent Knee Raise 10 reps;3 seconds   Bent Knee Raise Limitations hooklying brace marching with green TB     Knee/Hip Exercises: Stretches   Other Knee/Hip Stretches B SKTC stretch with towel x30"     Knee/Hip Exercises: Aerobic   Nustep lvl 4 x 4' (B UE/LE)     Knee/Hip Exercises: Supine   Bridges Both;10 reps  3-5" hold   Other Supine Knee/Hip Exercises --                PT Education - 05/25/17 0851    Education provided Yes  Education Details Posture & body mechanics education   Person(s) Educated Patient   Methods Explanation;Demonstration;Handout   Comprehension Verbalized understanding;Need further instruction          PT Short Term Goals - 05/25/17 0806      PT SHORT TERM GOAL #1   Title Independent with initial HEP by 06/05/17   Status On-going           PT Long Term Goals - 05/25/17 0806      PT LONG TERM GOAL #1   Title Independent with ongoing HEP by 07/03/17   Status On-going     PT LONG TERM GOAL #2   Title B hip strength >/= 4/5 for improved proximal stability by 07/03/17   Status On-going     PT LONG TERM GOAL #3   Title Pt will report ability to stand for >/= 30 minutes w/o limitation due to LBP or R hip pain by 07/03/17   Status On-going     PT LONG TERM GOAL #4   Title Pt will report ability to walk for >/= 20-30 minutes w/o limitation due to pain or weakness to allow pt to complete grocery shopping by 07/03/17   Status  On-going               Plan - 05/25/17 0807    Clinical Impression Statement Pt reporting limited compliance with HEP but has attempted exercises/stretches at home. Pt demonstrating limited activity tolerance and endurance with warm-up and exercises, requiring frequent rest breaks due to SOB. Pt very easily distracted and sometimes difficult to redirect back to intended task.   Rehab Potential Good   Clinical Impairments Affecting Rehab Potential obesity, bilateral knee OA, HTN   PT Treatment/Interventions Patient/family education;ADLs/Self Care Home Management;Therapeutic exercise;Manual techniques;Dry needling;Taping;Electrical Stimulation;Moist Heat;Iontophoresis 4mg /ml Dexamethasone;Gait training;Stair training;Neuromuscular re-education   PT Next Visit Plan proximal flexibility; LE/core strengthening; manual therapy to address increased muscle tension; modalities PRN   Consulted and Agree with Plan of Care Patient      Patient will benefit from skilled therapeutic intervention in order to improve the following deficits and impairments:  Pain, Impaired flexibility, Decreased strength, Increased muscle spasms, Difficulty walking, Decreased activity tolerance, Abnormal gait  Visit Diagnosis: Pain in right hip  Acute right-sided low back pain, with sciatica presence unspecified  Muscle weakness (generalized)  Difficulty in walking, not elsewhere classified     Problem List Patient Active Problem List   Diagnosis Date Noted  . Trochanteric bursitis of right hip 05/14/2017  . Morbid obesity ([place]) 08/01/2016  . IFG (impaired fasting glucose) 07/14/2016  . Primary osteoarthritis of both hands 08/03/2015  . Elevated LDL cholesterol level 09/16/2013  . Trigger thumb of left hand 09/16/2013  . Knee osteoarthritis 09/16/2013  . HTN (hypertension), benign 09/16/2013  . IDA (iron deficiency anemia) 09/16/2013  . Unspecified asthma(493.90) 09/16/2013  . Unspecified vitamin D  deficiency 09/16/2013  . 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    Percival Spanish, PT, MPT 05/25/2017, 9:08 AM  [facility] [address]  [place], [place], [address] Phone: [phone]   Fax:  [phone]  Name: [name] MRN: [id number] Date of Birth: Mar 29, 1956

## 2017-05-27 ENCOUNTER — Ambulatory Visit: Payer: 59 | Admitting: Rehabilitative and Restorative Service Providers"

## 2017-05-27 ENCOUNTER — Ambulatory Visit: Payer: 59 | Admitting: Obstetrics & Gynecology

## 2017-05-28 ENCOUNTER — Ambulatory Visit: Payer: 59 | Admitting: Physical Therapy

## 2017-05-28 DIAGNOSIS — M25551 Pain in right hip: Secondary | ICD-10-CM | POA: Diagnosis not present

## 2017-05-28 DIAGNOSIS — R262 Difficulty in walking, not elsewhere classified: Secondary | ICD-10-CM

## 2017-05-28 DIAGNOSIS — M545 Low back pain: Secondary | ICD-10-CM

## 2017-05-28 DIAGNOSIS — M6281 Muscle weakness (generalized): Secondary | ICD-10-CM

## 2017-05-28 NOTE — Therapy (Signed)
[facility] [address]  [place], [place], [address] Phone: [phone]   Fax:  [phone]  Physical Therapy Treatment  Patient Details  Name: [name] MRN: [id number] Date of Birth: 10/06/1956 Referring Provider: Gregor Hams, MD  Encounter Date: 05/28/2017      PT End of Session - 05/28/17 0803    Visit Number 3   Number of Visits 12   Date for PT Re-Evaluation 07/03/17   Authorization Type [employer] & [employer]   PT Start Time 0803   PT Stop Time 0848   PT Time Calculation (min) 45 min   Activity Tolerance Patient tolerated treatment well   Behavior During Therapy [facility] for tasks assessed/performed      Past Medical History:  Diagnosis Date  . Allergy    seasonal  . Anemia   . Arthritis   . Asthma   . 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 Blood transfusion without reported diagnosis    1980's  . Colon cancer ([place]) 2009   colon  . GERD (gastroesophageal reflux disease)   . Hiatal hernia   . Hypertension     Past Surgical History:  Procedure Laterality Date  . [place]  . CHOLECYSTECTOMY  1980  . COLONOSCOPY    . COLONOSCOPY WITH PROPOFOL N/A 10/02/2015   Procedure: COLONOSCOPY WITH PROPOFOL;  Surgeon: Ladene Artist, MD;  [facility];  Service: Endoscopy;  Laterality: N/A;  . PARTIAL COLECTOMY  2009  . POLYPECTOMY    . TUBAL LIGATION  1989    There were no vitals filed for this visit.      Subjective Assessment - 05/28/17 0806    Subjective Pt still has not attempted HEP due to c/o "sinus problems". Notes back not bothering her enough today to get her attention.   Patient Stated Goals "Be able to walk longer distances and stand for longer periods."   Currently in Pain? No/denies                         [facility] Adult PT Treatment/Exercise - 05/28/17 0803      Lumbar Exercises: Stretches   Passive Hamstring Stretch 30  seconds;2 reps   Passive Hamstring Stretch Limitations seated with heel on 9" foot stool   Prone Mid Back Stretch 30 seconds;2 reps   Prone Mid Back Stretch Limitations seated prayer stretch with green Pball     Lumbar Exercises: Standing   Heel Raises 20 reps;3 seconds   Heel Raises Limitations UE support at edge of sink   Functional Squats 10 reps;3 seconds   Functional Squats Limitations counter minisquat   Row Both;15 reps;Theraband;Strengthening   Theraband Level (Row) Level 3 (Green)   Shoulder Extension Both;15 reps;Theraband;Strengthening   Theraband Level (Shoulder Extension) Level 3 (Green)   Other Standing Lumbar Exercises B pallof press with green TB x10 each     Lumbar Exercises: Seated   Long Arc Quad on Chair Both;10 reps     Knee/Hip Exercises: Aerobic   Nustep lvl 4 x 4' (B UE/LE)                  PT Short Term Goals - 05/25/17 0806      PT SHORT TERM GOAL #1   Title Independent with initial HEP by 06/05/17   Status On-going  PT Long Term Goals - 05/25/17 0806      PT LONG TERM GOAL #1   Title Independent with ongoing HEP by 07/03/17   Status On-going     PT LONG TERM GOAL #2   Title B hip strength >/= 4/5 for improved proximal stability by 07/03/17   Status On-going     PT LONG TERM GOAL #3   Title Pt will report ability to stand for >/= 30 minutes w/o limitation due to LBP or R hip pain by 07/03/17   Status On-going     PT LONG TERM GOAL #4   Title Pt will report ability to walk for >/= 20-30 minutes w/o limitation due to pain or weakness to allow pt to complete grocery shopping by 07/03/17   Status On-going               Plan - 05/28/17 0807    Clinical Impression Statement Pt reporting continued noncompliance with HEP, but denies need for further training. Pt requesting to avoid supine exercises due to sinus issues, therefore focused on core activation and strengthening in upright position. Pt noting fatigue with  exercises but no increased pain.   Rehab Potential Good   Clinical Impairments Affecting Rehab Potential obesity, bilateral knee OA, HTN   PT Treatment/Interventions Patient/family education;ADLs/Self Care Home Management;Therapeutic exercise;Manual techniques;Dry needling;Taping;Electrical Stimulation;Moist Heat;Iontophoresis 4mg /ml Dexamethasone;Gait training;Stair training;Neuromuscular re-education   PT Next Visit Plan proximal flexibility; LE/core strengthening; manual therapy to address increased muscle tension; modalities PRN   Consulted and Agree with Plan of Care Patient      Patient will benefit from skilled therapeutic intervention in order to improve the following deficits and impairments:  Pain, Impaired flexibility, Decreased strength, Increased muscle spasms, Difficulty walking, Decreased activity tolerance, Abnormal gait  Visit Diagnosis: Pain in right hip  Acute right-sided low back pain, with sciatica presence unspecified  Muscle weakness (generalized)  Difficulty in walking, not elsewhere classified     Problem List Patient Active Problem List   Diagnosis Date Noted  . Trochanteric bursitis of right hip 05/14/2017  . Morbid obesity ([place]) 08/01/2016  . IFG (impaired fasting glucose) 07/14/2016  . Primary osteoarthritis of both hands 08/03/2015  . Elevated LDL cholesterol level 09/16/2013  . Trigger thumb of left hand 09/16/2013  . Knee osteoarthritis 09/16/2013  . HTN (hypertension), benign 09/16/2013  . IDA (iron deficiency anemia) 09/16/2013  . Unspecified asthma(493.90) 09/16/2013  . Unspecified vitamin D deficiency 09/16/2013  . 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    Percival Spanish, PT, MPT 05/28/2017, 8:51 AM  [facility] [address]  [place], [place], [address] Phone: [phone]   Fax:  [phone]  Name: [name] MRN: [id number] Date of Birth:  Mar 23, 1956

## 2017-05-29 LAB — OVA 1
CEA1: 3 ng/mL (ref 0.0–4.7)
Cancer Antigen (CA) 125: 24 U/mL (ref 0.0–38.1)
LIPID-ASSOCIATED SIALIC ACID: 27 mg/dL — AB (ref <20)

## 2017-06-01 ENCOUNTER — Ambulatory Visit: Payer: 59 | Admitting: Physical Therapy

## 2017-06-01 ENCOUNTER — Telehealth: Payer: Self-pay

## 2017-06-01 DIAGNOSIS — M545 Low back pain: Secondary | ICD-10-CM

## 2017-06-01 DIAGNOSIS — R262 Difficulty in walking, not elsewhere classified: Secondary | ICD-10-CM

## 2017-06-01 DIAGNOSIS — M25551 Pain in right hip: Secondary | ICD-10-CM

## 2017-06-01 DIAGNOSIS — N83291 Other ovarian cyst, right side: Secondary | ICD-10-CM

## 2017-06-01 DIAGNOSIS — M6281 Muscle weakness (generalized): Secondary | ICD-10-CM

## 2017-06-01 NOTE — Telephone Encounter (Signed)
Patient called and made aware that her Ovarian cancer screening test were normal.  Orders placed for follow up ultrasound and patient made aware that she will need follow up ultrasound. Kathrene Alu RNBSN

## 2017-06-01 NOTE — Therapy (Addendum)
[facility] [address]  [place], [place], [address] Phone: [phone]   Fax:  [phone]  Physical Therapy Treatment  Patient Details  Name: [name] MRN: [id number] Date of Birth: 07/29/1956 Referring Provider: Gregor Hams, MD  Encounter Date: 06/01/2017      PT End of Session - 06/01/17 0801    Visit Number 4   Number of Visits 12   Date for PT Re-Evaluation 07/03/17   Authorization Type [employer] & [employer]   PT Start Time 0801   PT Stop Time 0845   PT Time Calculation (min) 44 min   Activity Tolerance Patient tolerated treatment well   Behavior During Therapy [facility] for tasks assessed/performed      Past Medical History:  Diagnosis Date  . Allergy    seasonal  . Anemia   . Arthritis   . Asthma   . 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 Blood transfusion without reported diagnosis    1980's  . Colon cancer ([place]) 2009   colon  . GERD (gastroesophageal reflux disease)   . Hiatal hernia   . Hypertension     Past Surgical History:  Procedure Laterality Date  . [place]  . CHOLECYSTECTOMY  1980  . COLONOSCOPY    . COLONOSCOPY WITH PROPOFOL N/A 10/02/2015   Procedure: COLONOSCOPY WITH PROPOFOL;  Surgeon: Ladene Artist, MD;  [facility]: [facility];  Service: Endoscopy;  Laterality: N/A;  . PARTIAL COLECTOMY  2009  . POLYPECTOMY    . TUBAL LIGATION  1989    There were no vitals filed for this visit.      Subjective Assessment - 06/01/17 0804    Subjective Pt requesting written instrucitons for the exercises completed during last therapy session, reporting she attempted to replicate these exercises at home (not given as HEP), but still only partially compliant with initial HEP.   Patient Stated Goals "Be able to walk longer distances and stand for longer periods."   Currently in Pain? Yes   Pain Score 4   4-5/10   Pain Location Leg  thigh   Pain  Orientation Left;Upper   Pain Descriptors / Indicators Tightness   Pain Type Acute pain;Chronic pain   Pain Onset In the past 7 days   Pain Frequency Intermittent   Aggravating Factors  prolonged walking on Saturday                         [facility] Adult PT Treatment/Exercise - 06/01/17 0801      Lumbar Exercises: Stretches   Passive Hamstring Stretch 30 seconds;2 reps   Passive Hamstring Stretch Limitations seated with heel on 9" foot stool   Prone Mid Back Stretch 30 seconds;2 reps  each   Prone Mid Back Stretch Limitations seated 3 way prayer stretch with green Pball     Lumbar Exercises: Standing   Heel Raises 20 reps;3 seconds   Heel Raises Limitations UE support at edge of sink   Functional Squats 10 reps;3 seconds  2 sets   Functional Squats Limitations TRX & counter minisquat   Row Both;15 reps;Theraband;Strengthening   Theraband Level (Row) Level 3 (Green)   Shoulder Extension Both;15 reps;Theraband;Strengthening   Theraband Level (Shoulder Extension) Level 3 (Green)   Other Standing Lumbar Exercises B pallof press with green TB x10 each     Knee/Hip Exercises: Aerobic  Nustep lvl 4 x 5' (B UE/LE)                PT Education - 06/01/17 0845    Education provided Yes   Education Details HEP additions   Person(s) Educated Patient   Methods Explanation;Demonstration;Handout   Comprehension Verbalized understanding;Returned demonstration;Need further instruction          PT Short Term Goals - 05/25/17 0806      PT SHORT TERM GOAL #1   Title Independent with initial HEP by 06/05/17   Status On-going           PT Long Term Goals - 05/25/17 0806      PT LONG TERM GOAL #1   Title Independent with ongoing HEP by 07/03/17   Status On-going     PT LONG TERM GOAL #2   Title B hip strength >/= 4/5 for improved proximal stability by 07/03/17   Status On-going     PT LONG TERM GOAL #3   Title Pt will report ability to stand for >/= 30  minutes w/o limitation due to LBP or R hip pain by 07/03/17   Status On-going     PT LONG TERM GOAL #4   Title Pt will report ability to walk for >/= 20-30 minutes w/o limitation due to pain or weakness to allow pt to complete grocery shopping by 07/03/17   Status On-going               Plan - 06/01/17 4709    Clinical Impression Statement Pt noting benefit from last session with less back pain which lasted over a day. Reviewed exercises from last session and provided HEP instuctions for appropriate exercises to be completed at home. Reminded pt that not every exercise attempted during therapy is expected to be replicated at home.   Rehab Potential Good   Clinical Impairments Affecting Rehab Potential obesity, bilateral knee OA, HTN   PT Treatment/Interventions Patient/family education;ADLs/Self Care Home Management;Therapeutic exercise;Manual techniques;Dry needling;Taping;Electrical Stimulation;Moist Heat;Iontophoresis 4mg /ml Dexamethasone;Gait training;Stair training;Neuromuscular re-education   PT Next Visit Plan proximal flexibility; LE/core strengthening; manual therapy to address increased muscle tension; modalities PRN   Consulted and Agree with Plan of Care Patient      Patient will benefit from skilled therapeutic intervention in order to improve the following deficits and impairments:  Pain, Impaired flexibility, Decreased strength, Increased muscle spasms, Difficulty walking, Decreased activity tolerance, Abnormal gait  Visit Diagnosis: Pain in right hip  Acute right-sided low back pain, with sciatica presence unspecified  Muscle weakness (generalized)  Difficulty in walking, not elsewhere classified     Problem List Patient Active Problem List   Diagnosis Date Noted  . Trochanteric bursitis of right hip 05/14/2017  . Morbid obesity ([place]) 08/01/2016  . IFG (impaired fasting glucose) 07/14/2016  . Primary osteoarthritis of both hands 08/03/2015  . Elevated LDL  cholesterol level 09/16/2013  . Trigger thumb of left hand 09/16/2013  . Knee osteoarthritis 09/16/2013  . HTN (hypertension), benign 09/16/2013  . IDA (iron deficiency anemia) 09/16/2013  . Unspecified asthma(493.90) 09/16/2013  . Unspecified vitamin D deficiency 09/16/2013  . 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    Percival Spanish, PT, MPT 06/01/2017, 9:53 AM  [facility] [address]  [place], [place], [address] Phone: [phone]   Fax:  [phone]  Name: [name] MRN: [id number] Date of Birth: [birth year]

## 2017-06-01 NOTE — Telephone Encounter (Signed)
-----   Message from Lavonia Drafts, MD sent at 06/01/2017 10:49 AM EDT ----- Please call pt. Her ov ca screening tests were WNL.  She needs a f/u US to make sure that the lesion resolves. 6 more weeks.  clh-S

## 2017-06-04 ENCOUNTER — Ambulatory Visit: Payer: 59 | Attending: Family Medicine | Admitting: Physical Therapy

## 2017-06-04 DIAGNOSIS — M25551 Pain in right hip: Secondary | ICD-10-CM

## 2017-06-04 DIAGNOSIS — M545 Low back pain: Secondary | ICD-10-CM | POA: Insufficient documentation

## 2017-06-04 DIAGNOSIS — M6281 Muscle weakness (generalized): Secondary | ICD-10-CM | POA: Insufficient documentation

## 2017-06-04 DIAGNOSIS — R262 Difficulty in walking, not elsewhere classified: Secondary | ICD-10-CM | POA: Insufficient documentation

## 2017-06-04 NOTE — Therapy (Addendum)
[facility] [address]  [place], [place], [address] Phone: [phone]   Fax:  [phone]  Physical Therapy Treatment  Patient Details  Name: JAELYN BOURGOIN MRN: [id number] Date of Birth: [birth year] Referring Provider: Gregor Hams, MD  Encounter Date: 06/04/2017      PT End of Session - 06/04/17 0803    Visit Number 5   Number of Visits 12   Date for PT Re-Evaluation 07/03/17   Authorization Type [employer] & [employer]   PT Start Time 0803   PT Stop Time 0846   PT Time Calculation (min) 43 min   Activity Tolerance Patient tolerated treatment well   Behavior During Therapy [facility] for tasks assessed/performed      Past Medical History:  Diagnosis Date  . Allergy    seasonal  . Anemia   . Arthritis   . Asthma   . 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 Blood transfusion without reported diagnosis    1980's  . Colon cancer ([place]) 2009   colon  . GERD (gastroesophageal reflux disease)   . Hiatal hernia   . Hypertension     Past Surgical History:  Procedure Laterality Date  . [place]  . CHOLECYSTECTOMY  1980  . COLONOSCOPY    . COLONOSCOPY WITH PROPOFOL N/A 10/02/2015   Procedure: COLONOSCOPY WITH PROPOFOL;  Surgeon: Ladene Artist, MD;  [facility];  Service: Endoscopy;  Laterality: N/A;  . PARTIAL COLECTOMY  2009  . POLYPECTOMY    . TUBAL LIGATION  1989    There were no vitals filed for this visit.      Subjective Assessment - 06/04/17 0807    Subjective Pt requesting assistance with inflating her new physioball.   Patient Stated Goals "Be able to walk longer distances and stand for longer periods."   Currently in Pain? Yes   Pain Score 5    Pain Onset In the past 7 days                         [facility] Adult PT Treatment/Exercise - 06/04/17 0803      Lumbar Exercises: Stretches   Prone Mid Back Stretch 30 seconds;2 reps  each   Prone Mid Back Stretch Limitations seated 3 way prayer stretch with personal Pball (65 cm)     Lumbar Exercises: Machines for Strengthening   Other Lumbar Machine Exercise BATCA B narrow row 15# x10, 20# x10; unilateral narrow row 10# x10     Lumbar Exercises: Seated   Long Arc Quad on [place] Both;15 reps   LAQ on [place] Limitations 65 cm Pball   Hip Flexion on Ball Both;15 reps   Hip Flexion on Ball Limitations 65 cm Pball   Other Seated Lumbar Exercises seated row & shoulder extension with green TB x15, B pallof press with single green TB x15; seated on 65cm Pball     Lumbar Exercises: Sidelying   Other Sidelying Lumbar Exercises B open book stretch 5x10"     Knee/Hip Exercises: Aerobic   Nustep lvl 5 x 5' (B UE/LE)                PT Education - 06/04/17 0845    Education provided Yes   Education Details ball level lumbar exercises   Person(s) Educated Patient   Methods Explanation;Demonstration;Handout   Comprehension Verbalized understanding;Returned demonstration;Need further  instruction          PT Short Term Goals - 06/04/17 0813      PT SHORT TERM GOAL #1   Title Independent with initial HEP by 06/05/17   Status Achieved           PT Long Term Goals - 05/25/17 0806      PT LONG TERM GOAL #1   Title Independent with ongoing HEP by 07/03/17   Status On-going     PT LONG TERM GOAL #2   Title B hip strength >/= 4/5 for improved proximal stability by 07/03/17   Status On-going     PT LONG TERM GOAL #3   Title Pt will report ability to stand for >/= 30 minutes w/o limitation due to LBP or R hip pain by 07/03/17   Status On-going     PT LONG TERM GOAL #4   Title Pt will report ability to walk for >/= 20-30 minutes w/o limitation due to pain or weakness to allow pt to complete grocery shopping by 07/03/17   Status On-going               Plan - 06/04/17 0813    Clinical Impression Statement Recent HEP reviewed with instructions on modifications for  performance seated on Pball. Pt able to perform motions correctly but cueing needed to slow pace and increase hold times. Pt also instructed in proper use of rowing machine, emphasizing core activation and stable trunk position.   Rehab Potential Good   Clinical Impairments Affecting Rehab Potential obesity, bilateral knee OA, HTN   PT Treatment/Interventions Patient/family education;ADLs/Self Care Home Management;Therapeutic exercise;Manual techniques;Dry needling;Taping;Electrical Stimulation;Moist Heat;Iontophoresis 4mg /ml Dexamethasone;Gait training;Stair training;Neuromuscular re-education   PT Next Visit Plan proximal flexibility; LE/core strengthening; manual therapy to address increased muscle tension; modalities PRN   Consulted and Agree with Plan of Care Patient      Patient will benefit from skilled therapeutic intervention in order to improve the following deficits and impairments:  Pain, Impaired flexibility, Decreased strength, Increased muscle spasms, Difficulty walking, Decreased activity tolerance, Abnormal gait  Visit Diagnosis: Pain in right hip  Acute right-sided low back pain, with sciatica presence unspecified  Muscle weakness (generalized)  Difficulty in walking, not elsewhere classified     Problem List Patient Active Problem List   Diagnosis Date Noted  . Trochanteric bursitis of right hip 05/14/2017  . Morbid obesity ([place]) 08/01/2016  . IFG (impaired fasting glucose) 07/14/2016  . Primary osteoarthritis of both hands 08/03/2015  . Elevated LDL cholesterol level 09/16/2013  . Trigger thumb of left hand 09/16/2013  . Knee osteoarthritis 09/16/2013  . HTN (hypertension), benign 09/16/2013  . IDA (iron deficiency anemia) 09/16/2013  . Unspecified asthma(493.90) 09/16/2013  . Unspecified vitamin D deficiency 09/16/2013  . 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    Percival Spanish, PT, MPT 06/04/2017, 1:04 PM  [facility] [address]  [place], [place], [address] Phone: [phone]   Fax:  [phone]  Name: NIVA MURREN MRN: [id number] Date of Birth: 04/01/1956

## 2017-06-08 ENCOUNTER — Ambulatory Visit: Payer: 59 | Admitting: Physical Therapy

## 2017-06-08 DIAGNOSIS — M6281 Muscle weakness (generalized): Secondary | ICD-10-CM

## 2017-06-08 DIAGNOSIS — M545 Low back pain: Secondary | ICD-10-CM

## 2017-06-08 DIAGNOSIS — M25551 Pain in right hip: Secondary | ICD-10-CM

## 2017-06-08 DIAGNOSIS — R262 Difficulty in walking, not elsewhere classified: Secondary | ICD-10-CM

## 2017-06-08 NOTE — Therapy (Signed)
[facility] [address]  [place], [place], [address] Phone: [phone]   Fax:  [phone]  Physical Therapy Treatment  Patient Details  Name: [name] MRN: [id number] Date of Birth: [birth year] Referring Provider: Gregor Hams, MD  Encounter Date: 06/08/2017      PT End of Session - 06/08/17 0810    Visit Number 6   Number of Visits 12   Date for PT Re-Evaluation 07/03/17   Authorization Type [employer] & [employer]   PT Start Time 0810   PT Stop Time 0850   PT Time Calculation (min) 40 min   Activity Tolerance Patient tolerated treatment well   Behavior During Therapy [facility] for tasks assessed/performed      Past Medical History:  Diagnosis Date  . Allergy    seasonal  . Anemia   . Arthritis   . Asthma   . 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 Blood transfusion without reported diagnosis    1980's  . Colon cancer ([place]) 2009   colon  . GERD (gastroesophageal reflux disease)   . Hiatal hernia   . Hypertension     Past Surgical History:  Procedure Laterality Date  . [place]  . CHOLECYSTECTOMY  1980  . COLONOSCOPY    . COLONOSCOPY WITH PROPOFOL N/A 10/02/2015   Procedure: COLONOSCOPY WITH PROPOFOL;  Surgeon: Ladene Artist, MD;  [facility];  Service: Endoscopy;  Laterality: N/A;  . PARTIAL COLECTOMY  2009  . POLYPECTOMY    . TUBAL LIGATION  1989    There were no vitals filed for this visit.      Subjective Assessment - 06/08/17 0813    Subjective Pt reports she took 2 Alieve in the middle of the night. Denies pain this morning.   Patient Stated Goals "Be able to walk longer distances and stand for longer periods."   Currently in Pain? No/denies   Pain Score 0-No pain   Pain Onset In the past 7 days                         [facility] Adult PT Treatment/Exercise - 06/08/17 0810      Lumbar Exercises: Stretches   Double Knee to  Chest Stretch Limitations 10x5" - heels on peanut ball   Hip Flexor Stretch 30 seconds;2 reps   Hip Flexor Stretch Limitations mod thomas with strap   ITB Stretch 30 seconds;2 reps   ITB Stretch Limitations supine with strap     Lumbar Exercises: Supine   Clam 15 reps;5 seconds   Clam Limitations hooklying alt hip ABD/ER with green TB   Bent Knee Raise 15 reps;3 seconds   Bent Knee Raise Limitations hooklying brace marching with green TB   Isometric Hip Flexion 10 reps;3 seconds   Isometric Hip Flexion Limitations heels on peanut ball     Lumbar Exercises: Sidelying   Clam 10 reps;3 seconds     Knee/Hip Exercises: Aerobic   Nustep lvl 5 x 5' (B UE/LE)                  PT Short Term Goals - 06/04/17 0813      PT SHORT TERM GOAL #1   Title Independent with initial HEP by 06/05/17   Status Achieved           PT Long Term Goals - 05/25/17  0806      PT LONG TERM GOAL #1   Title Independent with ongoing HEP by 07/03/17   Status On-going     PT LONG TERM GOAL #2   Title B hip strength >/= 4/5 for improved proximal stability by 07/03/17   Status On-going     PT LONG TERM GOAL #3   Title Pt will report ability to stand for >/= 30 minutes w/o limitation due to LBP or R hip pain by 07/03/17   Status On-going     PT LONG TERM GOAL #4   Title Pt will report ability to walk for >/= 20-30 minutes w/o limitation due to pain or weakness to allow pt to complete grocery shopping by 07/03/17   Status On-going               Plan - 06/08/17 0815    Clinical Impression Statement Pt reporting benefit from PT, stating not having to take prescription pain meds and muscle relaxants except on rare occasions, and only taking Alieve occasionly. Still noting some tightness in thighs at times, therefore reviewed stretches and added mod thomas hip flexor stretch. Pt tolerating strengthening progression but continues to fatigue requring frequent rest breaks.   Rehab Potential Good    Clinical Impairments Affecting Rehab Potential obesity, bilateral knee OA, HTN   PT Treatment/Interventions Patient/family education;ADLs/Self Care Home Management;Therapeutic exercise;Manual techniques;Dry needling;Taping;Electrical Stimulation;Moist Heat;Iontophoresis 4mg /ml Dexamethasone;Gait training;Stair training;Neuromuscular re-education   PT Next Visit Plan proximal flexibility; LE/core strengthening; manual therapy to address increased muscle tension; modalities PRN   Consulted and Agree with Plan of Care Patient      Patient will benefit from skilled therapeutic intervention in order to improve the following deficits and impairments:  Pain, Impaired flexibility, Decreased strength, Increased muscle spasms, Difficulty walking, Decreased activity tolerance, Abnormal gait  Visit Diagnosis: Pain in right hip  Acute right-sided low back pain, with sciatica presence unspecified  Muscle weakness (generalized)  Difficulty in walking, not elsewhere classified     Problem List Patient Active Problem List   Diagnosis Date Noted  . Trochanteric bursitis of right hip 05/14/2017  . Morbid obesity ([place]) 08/01/2016  . IFG (impaired fasting glucose) 07/14/2016  . Primary osteoarthritis of both hands 08/03/2015  . Elevated LDL cholesterol level 09/16/2013  . Trigger thumb of left hand 09/16/2013  . Knee osteoarthritis 09/16/2013  . HTN (hypertension), benign 09/16/2013  . IDA (iron deficiency anemia) 09/16/2013  . Unspecified asthma(493.90) 09/16/2013  . Unspecified vitamin D deficiency 09/16/2013  . 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    Percival Spanish, PT, MPT 06/08/2017, 8:59 AM  [facility] [address]  [place], [place], [address] Phone: [phone]   Fax:  [phone]  Name: [name] MRN: [id number] Date of Birth: 09/17/1956

## 2017-06-11 ENCOUNTER — Ambulatory Visit: Payer: 59 | Admitting: Physical Therapy

## 2017-06-12 ENCOUNTER — Ambulatory Visit: Payer: 59 | Admitting: Physical Therapy

## 2017-06-12 DIAGNOSIS — M25551 Pain in right hip: Secondary | ICD-10-CM

## 2017-06-12 DIAGNOSIS — M6281 Muscle weakness (generalized): Secondary | ICD-10-CM

## 2017-06-12 DIAGNOSIS — R262 Difficulty in walking, not elsewhere classified: Secondary | ICD-10-CM

## 2017-06-12 DIAGNOSIS — M545 Low back pain: Secondary | ICD-10-CM

## 2017-06-12 NOTE — Therapy (Signed)
[facility] [address]  [place], [place], [address] Phone: [phone]   Fax:  [phone]  Physical Therapy Treatment  Patient Details  Name: [name] MRN: [id number] Date of Birth: 17-Jul-1956 Referring Provider: Gregor Hams, MD  Encounter Date: 06/12/2017      PT End of Session - 06/12/17 0805    Visit Number 7   Number of Visits 12   Date for PT Re-Evaluation 07/03/17   Authorization Type [employer] & [employer]   PT Start Time 0805   PT Stop Time 0846   PT Time Calculation (min) 41 min   Activity Tolerance Patient tolerated treatment well   Behavior During Therapy [facility] for tasks assessed/performed      Past Medical History:  Diagnosis Date  . Allergy    seasonal  . Anemia   . Arthritis   . Asthma   . 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 Blood transfusion without reported diagnosis    1980's  . Colon cancer ([place]) 2009   colon  . GERD (gastroesophageal reflux disease)   . Hiatal hernia   . Hypertension     Past Surgical History:  Procedure Laterality Date  . [place]  . CHOLECYSTECTOMY  1980  . COLONOSCOPY    . COLONOSCOPY WITH PROPOFOL N/A 10/02/2015   Procedure: COLONOSCOPY WITH PROPOFOL;  Surgeon: Ladene Artist, MD;  [facility];  Service: Endoscopy;  Laterality: N/A;  . PARTIAL COLECTOMY  2009  . POLYPECTOMY    . TUBAL LIGATION  1989    There were no vitals filed for this visit.      Subjective Assessment - 06/12/17 0808    Subjective Pt doing well today.   Patient Stated Goals "Be able to walk longer distances and stand for longer periods."   Currently in Pain? No/denies   Pain Score 0-No pain   Pain Onset In the past 7 days                         [facility] Adult PT Treatment/Exercise - 06/12/17 0805      Lumbar Exercises: Machines for Strengthening   Other Lumbar Machine Exercise BATCA B narrow row 20# x15;  unilateral narrow row 10# x10     Lumbar Exercises: Standing   Forward Lunge 10 reps   Forward Lunge Limitations mini-lunge   Row Both;15 reps;Theraband;Strengthening   Theraband Level (Row) Level 4 (Blue)   Shoulder Extension Both;15 reps;Theraband;Strengthening   Theraband Level (Shoulder Extension) Level 4 (Blue)     Lumbar Exercises: Supine   Dead Bug 10 reps;3 seconds     Lumbar Exercises: Quadruped   [place] 5 reps     Knee/Hip Exercises: Aerobic   Nustep lvl 5 x 6' (B UE/LE)                  PT Short Term Goals - 06/04/17 0813      PT SHORT TERM GOAL #1   Title Independent with initial HEP by 06/05/17   Status Achieved           PT Long Term Goals - 05/25/17 0806      PT LONG TERM GOAL #1   Title Independent with ongoing HEP by 07/03/17   Status On-going     PT LONG TERM GOAL #2   Title B hip strength >/= 4/5  for improved proximal stability by 07/03/17   Status On-going     PT LONG TERM GOAL #3   Title Pt will report ability to stand for >/= 30 minutes w/o limitation due to LBP or R hip pain by 07/03/17   Status On-going     PT LONG TERM GOAL #4   Title Pt will report ability to walk for >/= 20-30 minutes w/o limitation due to pain or weakness to allow pt to complete grocery shopping by 07/03/17   Status On-going               Plan - 06/12/17 0809    Clinical Impression Statement Pt stating she is glad she came to PT as she thinks it's really helping. She demonstrates limited tolerance for progression of exercises due to obesity, knee OA and breathing issues, as well as easily distracted requiring frequent redirection.   Rehab Potential Good   Clinical Impairments Affecting Rehab Potential obesity, bilateral knee OA, HTN   PT Treatment/Interventions Patient/family education;ADLs/Self Care Home Management;Therapeutic exercise;Manual techniques;Dry needling;Taping;Electrical Stimulation;Moist Heat;Iontophoresis 4mg /ml Dexamethasone;Gait  training;Stair training;Neuromuscular re-education   PT Next Visit Plan proximal flexibility; LE/core strengthening; manual therapy to address increased muscle tension; modalities PRN   Consulted and Agree with Plan of Care Patient      Patient will benefit from skilled therapeutic intervention in order to improve the following deficits and impairments:  Pain, Impaired flexibility, Decreased strength, Increased muscle spasms, Difficulty walking, Decreased activity tolerance, Abnormal gait  Visit Diagnosis: Pain in right hip  Acute right-sided low back pain, with sciatica presence unspecified  Muscle weakness (generalized)  Difficulty in walking, not elsewhere classified     Problem List Patient Active Problem List   Diagnosis Date Noted  . Trochanteric bursitis of right hip 05/14/2017  . Morbid obesity ([place]) 08/01/2016  . IFG (impaired fasting glucose) 07/14/2016  . Primary osteoarthritis of both hands 08/03/2015  . Elevated LDL cholesterol level 09/16/2013  . Trigger thumb of left hand 09/16/2013  . Knee osteoarthritis 09/16/2013  . HTN (hypertension), benign 09/16/2013  . IDA (iron deficiency anemia) 09/16/2013  . Unspecified asthma(493.90) 09/16/2013  . Unspecified vitamin D deficiency 09/16/2013  . 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    Percival Spanish, PT, MPT 06/12/2017, 8:47 AM  [facility] [address]  [place], [place], [address] Phone: [phone]   Fax:  [phone]  Name: [name] MRN: [id number] Date of Birth: [birth year]

## 2017-06-15 ENCOUNTER — Ambulatory Visit: Payer: 59 | Admitting: Physical Therapy

## 2017-06-15 DIAGNOSIS — M6281 Muscle weakness (generalized): Secondary | ICD-10-CM

## 2017-06-15 DIAGNOSIS — M25551 Pain in right hip: Secondary | ICD-10-CM | POA: Diagnosis not present

## 2017-06-15 DIAGNOSIS — M545 Low back pain: Secondary | ICD-10-CM

## 2017-06-15 DIAGNOSIS — R262 Difficulty in walking, not elsewhere classified: Secondary | ICD-10-CM

## 2017-06-15 NOTE — Therapy (Signed)
[facility] [address]  [place], [place], [address] Phone: [phone]   Fax:  [phone]  Physical Therapy Treatment  Patient Details  Name: [name] MRN: [id number] Date of Birth: 11-09-1955 Referring Provider: Gregor Hams, MD  Encounter Date: 06/15/2017      PT End of Session - 06/15/17 0811    Visit Number 8   Number of Visits 12   Date for PT Re-Evaluation 07/03/17   Authorization Type [employer]   PT Start Time 0811   PT Stop Time 0849   PT Time Calculation (min) 38 min   Activity Tolerance Patient tolerated treatment well   Behavior During Therapy [facility] for tasks assessed/performed      Past Medical History:  Diagnosis Date  . Allergy    seasonal  . Anemia   . Arthritis   . Asthma   . 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 Blood transfusion without reported diagnosis    1980's  . Colon cancer ([place]) 2009   colon  . GERD (gastroesophageal reflux disease)   . Hiatal hernia   . Hypertension     Past Surgical History:  Procedure Laterality Date  . [place]  . CHOLECYSTECTOMY  1980  . COLONOSCOPY    . COLONOSCOPY WITH PROPOFOL N/A 10/02/2015   Procedure: COLONOSCOPY WITH PROPOFOL;  Surgeon: Ladene Artist, MD;  Location: [facility];  Service: Endoscopy;  Laterality: N/A;  . PARTIAL COLECTOMY  2009  . POLYPECTOMY    . TUBAL LIGATION  1989    There were no vitals filed for this visit.      Subjective Assessment - 06/15/17 0812    Subjective Pt reports she did lot of walking on Sat. No back pain but increased knee/leg pain and swelling.   Limitations Standing   How long can you sit comfortably? ~2.5 - 3 hrs   How long can you stand comfortably? 20 minutes   How long can you walk comfortably? not limited by back at present   Patient Stated Goals "Be able to walk longer distances and stand for longer periods."   Currently in Pain? No/denies    Pain Score 0-No pain   Pain Onset In the past 7 days                         [facility] Adult PT Treatment/Exercise - 06/15/17 0811      Lumbar Exercises: Stretches   Double Knee to Chest Stretch Limitations 10x5" - heels on 65 cm ball   Hip Flexor Stretch 30 seconds;2 reps   Hip Flexor Stretch Limitations mod thomas with strap   Prone Mid Back Stretch 30 seconds;2 reps  each   Prone Mid Back Stretch Limitations seated 3 way prayer stretch with green Pball   ITB Stretch 30 seconds;2 reps   ITB Stretch Limitations supine with strap     Lumbar Exercises: Standing   Row Both;15 reps;Theraband;Strengthening   Theraband Level (Row) Level 4 (Blue)   Row Limitations cues to keep forearms parallel to floor   Shoulder Extension Both;15 reps;Theraband;Strengthening   Theraband Level (Shoulder Extension) Level 4 (Blue)   Other Standing Lumbar Exercises B pallof press with blue TB x15 each     Lumbar Exercises: Seated   Long Arc Quad on [employer]   LAQ on [place] Limitations 65 cm Pball  Hip Flexion on Ball Both;10 reps  2 sets   Hip Flexion on Ball Limitations 1st set marching, 2nd set + opp UE flexion; 65 cm Pball                PT Education - 06/15/17 0853    Education provided Yes   Education Details HEP addition - supine stretches   Person(s) Educated Patient   Methods Explanation;Demonstration;Handout   Comprehension Verbalized understanding;Returned demonstration          PT Short Term Goals - 06/04/17 0813      PT SHORT TERM GOAL #1   Title Independent with initial HEP by 06/05/17   Status Achieved           PT Long Term Goals - 05/25/17 0806      PT LONG TERM GOAL #1   Title Independent with ongoing HEP by 07/03/17   Status On-going     PT LONG TERM GOAL #2   Title B hip strength >/= 4/5 for improved proximal stability by 07/03/17   Status On-going     PT LONG TERM GOAL #3   Title Pt will report ability to stand for >/= 30  minutes w/o limitation due to LBP or R hip pain by 07/03/17   Status On-going     PT LONG TERM GOAL #4   Title Pt will report ability to walk for >/= 20-30 minutes w/o limitation due to pain or weakness to allow pt to complete grocery shopping by 07/03/17   Status On-going               Plan - 06/15/17 0825    Clinical Impression Statement Pt noting benefit from PT with back pain becoming less of an issue. Improved tolerance for all positions with back pain not limiting walking and able to tolerate sitting for 2.5 to 3 hrs before needing a break. Still noted greatest limitation with static standing which limits her participation in church services, but notes even this has improved.   Rehab Potential Good   Clinical Impairments Affecting Rehab Potential obesity, bilateral knee OA, HTN   PT Treatment/Interventions Patient/family education;ADLs/Self Care Home Management;Therapeutic exercise;Manual techniques;Dry needling;Taping;Electrical Stimulation;Moist Heat;Iontophoresis 4mg /ml Dexamethasone;Gait training;Stair training;Neuromuscular re-education   PT Next Visit Plan proximal flexibility; LE/core strengthening; manual therapy to address increased muscle tension; modalities PRN   Consulted and Agree with Plan of Care Patient      Patient will benefit from skilled therapeutic intervention in order to improve the following deficits and impairments:  Pain, Impaired flexibility, Decreased strength, Increased muscle spasms, Difficulty walking, Decreased activity tolerance, Abnormal gait  Visit Diagnosis: Pain in right hip  Acute right-sided low back pain, with sciatica presence unspecified  Muscle weakness (generalized)  Difficulty in walking, not elsewhere classified     Problem List Patient Active Problem List   Diagnosis Date Noted  . Trochanteric bursitis of right hip 05/14/2017  . Morbid obesity ([place]) 08/01/2016  . IFG (impaired fasting glucose) 07/14/2016  . Primary  osteoarthritis of both hands 08/03/2015  . Elevated LDL cholesterol level 09/16/2013  . Trigger thumb of left hand 09/16/2013  . Knee osteoarthritis 09/16/2013  . HTN (hypertension), benign 09/16/2013  . IDA (iron deficiency anemia) 09/16/2013  . Unspecified asthma(493.90) 09/16/2013  . Unspecified vitamin D deficiency 09/16/2013  . 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    Percival Spanish, PT, MPT 06/15/2017, 8:54 AM  [facility]  [place], [place], [address] Phone: [phone]   Fax:  [phone]  Name: [name] MRN: [id number] Date of Birth: 28-Sep-1956

## 2017-06-18 ENCOUNTER — Ambulatory Visit: Payer: 59 | Admitting: Physical Therapy

## 2017-06-18 DIAGNOSIS — M25551 Pain in right hip: Secondary | ICD-10-CM

## 2017-06-18 DIAGNOSIS — M545 Low back pain: Secondary | ICD-10-CM

## 2017-06-18 DIAGNOSIS — R262 Difficulty in walking, not elsewhere classified: Secondary | ICD-10-CM

## 2017-06-18 DIAGNOSIS — M6281 Muscle weakness (generalized): Secondary | ICD-10-CM

## 2017-06-18 NOTE — Therapy (Signed)
[facility] [address]  [place], [place], [address] Phone: [phone]   Fax:  [phone]  Physical Therapy Treatment  Patient Details  Name: [name] MRN: [id number] Date of Birth: 10-25-1956 Referring Provider: Gregor Hams, MD  Encounter Date: 06/18/2017      PT End of Session - 06/18/17 0806    Visit Number 9   Number of Visits 12   Date for PT Re-Evaluation 07/03/17   Authorization Type [employer]   PT Start Time 0806   PT Stop Time 0844   PT Time Calculation (min) 38 min   Activity Tolerance Patient tolerated treatment well   Behavior During Therapy [facility] for tasks assessed/performed      Past Medical History:  Diagnosis Date  . Allergy    seasonal  . Anemia   . Arthritis   . Asthma   . 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 Blood transfusion without reported diagnosis    1980's  . Colon cancer ([place]) 2009   colon  . GERD (gastroesophageal reflux disease)   . Hiatal hernia   . Hypertension     Past Surgical History:  Procedure Laterality Date  . [place]  . CHOLECYSTECTOMY  1980  . COLONOSCOPY    . COLONOSCOPY WITH PROPOFOL N/A 10/02/2015   Procedure: COLONOSCOPY WITH PROPOFOL;  Surgeon: Ladene Artist, MD;  Location: [facility];  Service: Endoscopy;  Laterality: N/A;  . PARTIAL COLECTOMY  2009  . POLYPECTOMY    . TUBAL LIGATION  1989    There were no vitals filed for this visit.      Subjective Assessment - 06/18/17 0809    Subjective Pt noting "a little bit of pain down in that R cheek" last night. Resolved with Aleive and no pain this morning. Pt noting she sometimes has pain in this area when she is constipated and she did not eat her raisin bran yesterday.   Patient Stated Goals "Be able to walk longer distances and stand for longer periods."   Currently in Pain? No/denies   Pain Score 0-No pain   Pain Onset In the past 7 days                          [facility] Adult PT Treatment/Exercise - 06/18/17 0806      Lumbar Exercises: Stretches   Prone Mid Back Stretch 30 seconds;2 reps  each   Prone Mid Back Stretch Limitations seated 3 way prayer stretch with green Pball   Piriformis Stretch 30 seconds;3 reps   Piriformis Stretch Limitations seated with LE ER on edge of seat and leaning forward     Lumbar Exercises: Standing   Row Both;15 reps;Theraband;Strengthening   Theraband Level (Row) Level 4 (Blue)   Shoulder Extension Both;15 reps;Theraband;Strengthening   Theraband Level (Shoulder Extension) Level 4 (Blue)   Other Standing Lumbar Exercises B pallof press with blue TB x15 each     Knee/Hip Exercises: Aerobic   Nustep lvl 5 x 6' (B UE/LE)     Knee/Hip Exercises: Standing   Hip Flexion Both;10 reps;Knee straight;Stengthening   Hip Flexion Limitations red TB; UE support on back of chair   Hip ADduction Both;10 reps;Strengthening   Hip ADduction Limitations red TB; UE support on back of chair   Hip Abduction Both;10 reps;Knee straight;Stengthening   Abduction Limitations red TB;  UE support on back of chair   Hip Extension Both;10 reps;Knee straight;Stengthening   Extension Limitations red TB; UE support on back of chair                  PT Short Term Goals - 06/04/17 0813      PT SHORT TERM GOAL #1   Title Independent with initial HEP by 06/05/17   Status Achieved           PT Long Term Goals - 05/25/17 0806      PT LONG TERM GOAL #1   Title Independent with ongoing HEP by 07/03/17   Status On-going     PT LONG TERM GOAL #2   Title B hip strength >/= 4/5 for improved proximal stability by 07/03/17   Status On-going     PT LONG TERM GOAL #3   Title Pt will report ability to stand for >/= 30 minutes w/o limitation due to LBP or R hip pain by 07/03/17   Status On-going     PT LONG TERM GOAL #4   Title Pt will report ability to walk for >/= 20-30 minutes w/o limitation  due to pain or weakness to allow pt to complete grocery shopping by 07/03/17   Status On-going               Plan - 06/18/17 0806    Clinical Impression Statement Pt reporting some mild R buttock pain yesterday evening which has resolved this morning. Treatment focus today on piriformis stretching and proximal LE strengthening while engaging core with fatigue noted but no increased pain.   Rehab Potential Good   Clinical Impairments Affecting Rehab Potential obesity, bilateral knee OA, HTN   PT Treatment/Interventions Patient/family education;ADLs/Self Care Home Management;Therapeutic exercise;Manual techniques;Dry needling;Taping;Electrical Stimulation;Moist Heat;Iontophoresis 4mg /ml Dexamethasone;Gait training;Stair training;Neuromuscular re-education   PT Next Visit Plan proximal flexibility; LE/core strengthening; manual therapy to address increased muscle tension; modalities PRN   Consulted and Agree with Plan of Care Patient      Patient will benefit from skilled therapeutic intervention in order to improve the following deficits and impairments:  Pain, Impaired flexibility, Decreased strength, Increased muscle spasms, Difficulty walking, Decreased activity tolerance, Abnormal gait  Visit Diagnosis: Pain in right hip  Acute right-sided low back pain, with sciatica presence unspecified  Muscle weakness (generalized)  Difficulty in walking, not elsewhere classified     Problem List Patient Active Problem List   Diagnosis Date Noted  . Trochanteric bursitis of right hip 05/14/2017  . Morbid obesity ([place]) 08/01/2016  . IFG (impaired fasting glucose) 07/14/2016  . Primary osteoarthritis of both hands 08/03/2015  . Elevated LDL cholesterol level 09/16/2013  . Trigger thumb of left hand 09/16/2013  . Knee osteoarthritis 09/16/2013  . HTN (hypertension), benign 09/16/2013  . IDA (iron deficiency anemia) 09/16/2013  . Unspecified asthma(493.90) 09/16/2013  . Unspecified  vitamin D deficiency 09/16/2013  . 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    Percival Spanish, PT, MPT 06/18/2017, 8:45 AM  [facility] [address]  [place], [place], [address] Phone: [phone]   Fax:  [phone]  Name: [name] MRN: [id number] Date of Birth: 07-06-1956

## 2017-06-22 ENCOUNTER — Ambulatory Visit: Payer: 59 | Admitting: Family Medicine

## 2017-06-23 ENCOUNTER — Ambulatory Visit: Payer: 59 | Admitting: Physical Therapy

## 2017-06-25 ENCOUNTER — Ambulatory Visit: Payer: 59 | Admitting: Physical Therapy

## 2017-06-25 DIAGNOSIS — M545 Low back pain: Secondary | ICD-10-CM

## 2017-06-25 DIAGNOSIS — R262 Difficulty in walking, not elsewhere classified: Secondary | ICD-10-CM

## 2017-06-25 DIAGNOSIS — M25551 Pain in right hip: Secondary | ICD-10-CM | POA: Diagnosis not present

## 2017-06-25 DIAGNOSIS — M6281 Muscle weakness (generalized): Secondary | ICD-10-CM

## 2017-06-25 NOTE — Therapy (Signed)
[facility] [address]  [place], [place], [address] Phone: [phone]   Fax:  [phone]  Physical Therapy Treatment  Patient Details  Name: [name] MRN: [id number] Date of Birth: [birth year] Referring Provider: Gregor Hams, MD  Encounter Date: 06/25/2017      PT End of Session - 06/25/17 0810    Visit Number 10   Number of Visits 12   Date for PT Re-Evaluation 07/03/17   Authorization Type [employer] & [employer]   PT Start Time [phone]  pt arrived late   PT Stop Time 0848   PT Time Calculation (min) 38 min   Activity Tolerance Patient tolerated treatment well   Behavior During Therapy [facility] for tasks assessed/performed      Past Medical History:  Diagnosis Date  . Allergy    seasonal  . Anemia   . Arthritis   . Asthma   . 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 Blood transfusion without reported diagnosis    1980's  . Colon cancer ([place]) 2009   colon  . GERD (gastroesophageal reflux disease)   . Hiatal hernia   . Hypertension     Past Surgical History:  Procedure Laterality Date  . [place]  . CHOLECYSTECTOMY  1980  . COLONOSCOPY    . COLONOSCOPY WITH PROPOFOL N/A 10/02/2015   Procedure: COLONOSCOPY WITH PROPOFOL;  Surgeon: Ladene Artist, MD;  Location: [facility];  Service: Endoscopy;  Laterality: N/A;  . PARTIAL COLECTOMY  2009  . POLYPECTOMY    . TUBAL LIGATION  1989    There were no vitals filed for this visit.      Subjective Assessment - 06/25/17 0813    Subjective Pt noting her has started hurting again now that she has finished the prednisone that seh was taking for her lungs.   Limitations Standing   How long can you stand comfortably? 15-20 minutes   Patient Stated Goals "Be able to walk longer distances and stand for longer periods."   Currently in Pain? Yes   Pain Score 4    Pain Location Buttocks   Pain Orientation  Right;Posterior;Lateral   Pain Descriptors / Indicators Aching   Pain Type Acute pain;Chronic pain   Pain Onset In the past 7 days   Pain Frequency Intermittent   Aggravating Factors  stopped prednisone   Pain Relieving Factors pain meds            [facility] PT Assessment - 06/25/17 0810      Assessment   Medical Diagnosis R hip trochanteric bursitis & LBP   Referring Provider Gregor Hams, MD   Next MD Visit 06/26/17     Observation/Other Assessments   Focus on Therapeutic Outcomes (FOTO)  Hip 62% (38% limitation)     Strength   Right Hip Flexion 4/5   Right Hip Extension 4-/5   Right Hip External Rotation  4-/5   Right Hip Internal Rotation 4+/5   Right Hip ABduction 4/5   Right Hip ADduction 4/5   Left Hip Flexion 4/5   Left Hip Extension 4-/5   Left Hip External Rotation 4-/5   Left Hip Internal Rotation 4+/5   Left Hip ABduction 4/5   Left Hip ADduction 4/5                     [facility] Adult PT Treatment/Exercise - 06/25/17 8099  Lumbar Exercises: Stretches   Piriformis Stretch 30 seconds;2 reps   Piriformis Stretch Limitations seated with LE ER on edge of seat and leaning forward     Knee/Hip Exercises: Stretches   Other Knee/Hip Stretches piriformis & glute release with small ball on wall x2'     Knee/Hip Exercises: Aerobic   Nustep lvl 6 x 5' (B UE/LE)                  PT Short Term Goals - 06/04/17 0813      PT SHORT TERM GOAL #1   Title Independent with initial HEP by 06/05/17   Status Achieved           PT Long Term Goals - 07-15-17 0829      PT LONG TERM GOAL #1   Title Independent with ongoing HEP by 07/03/17   Status Partially Met     PT LONG TERM GOAL #2   Title B hip strength >/= 4/5 for improved proximal stability by 07/03/17   Status Partially Met  met except hip extension & ER     PT LONG TERM GOAL #3   Title Pt will report ability to stand for >/= 30 minutes w/o limitation due to LBP or R hip pain by 07/03/17    Status On-going     PT LONG TERM GOAL #4   Title Pt will report ability to walk for >/= 20-30 minutes w/o limitation due to pain or weakness to allow pt to complete grocery shopping by 07/03/17   Status Partially Met               Plan - 07-15-17 0817    Clinical Impression Statement [name] has demonstrated good progress with PT with improving strength and activity tolerance. She was pain-free for a period while on prednisone for her lungs, but has noted return of R SIJ/glute/buttock pain since stopping steroids but no further lateral hip/trochanteric pain. Goals for the most part are partially met at this point with 2 visits remaining in current POC. Given return of pain since stopping steroids, may need to consider recert at end of current POC but will reassess at that time.   Rehab Potential Good   Clinical Impairments Affecting Rehab Potential obesity, bilateral knee OA, HTN   PT Treatment/Interventions Patient/family education;ADLs/Self Care Home Management;Therapeutic exercise;Manual techniques;Dry needling;Taping;Electrical Stimulation;Moist Heat;Iontophoresis 78m/ml Dexamethasone;Gait training;Stair training;Neuromuscular re-education   PT Next Visit Plan proximal flexibility; LE/core strengthening; manual therapy to address increased muscle tension; modalities PRN   Consulted and Agree with Plan of Care Patient      Patient will benefit from skilled therapeutic intervention in order to improve the following deficits and impairments:  Pain, Impaired flexibility, Decreased strength, Increased muscle spasms, Difficulty walking, Decreased activity tolerance, Abnormal gait  Visit Diagnosis: Pain in right hip  Acute right-sided low back pain, with sciatica presence unspecified  Muscle weakness (generalized)  Difficulty in walking, not elsewhere classified       G-Codes - 0Sep 12, [id number]   Functional Assessment Tool Used (Outpatient Only) Hip FOTO = 62% (38% limitation)    Functional Limitation Mobility: Walking and moving around   Mobility: Walking and Moving Around Current Status (([id number] At least 20 percent but less than 40 percent impaired, limited or restricted   Mobility: Walking and Moving Around Goal Status [phone] At least 40 percent but less than 60 percent impaired, limited or restricted      Problem List Patient Active Problem List   Diagnosis  Date Noted  . Trochanteric bursitis of right hip 05/14/2017  . Morbid obesity ([place]) 08/01/2016  . IFG (impaired fasting glucose) 07/14/2016  . Primary osteoarthritis of both hands 08/03/2015  . Elevated LDL cholesterol level 09/16/2013  . Trigger thumb of left hand 09/16/2013  . Knee osteoarthritis 09/16/2013  . HTN (hypertension), benign 09/16/2013  . IDA (iron deficiency anemia) 09/16/2013  . Unspecified asthma(493.90) 09/16/2013  . Unspecified vitamin D deficiency 09/16/2013  . 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    Percival Spanish, PT, MPT 06/25/2017, 9:44 AM  [facility] [address]  [place], [place], [address] Phone: [phone]   Fax:  [phone]  Name: [name] MRN: [id number] Date of Birth: 01/21/1956

## 2017-06-26 ENCOUNTER — Encounter: Payer: Self-pay | Admitting: Family Medicine

## 2017-06-26 ENCOUNTER — Ambulatory Visit (INDEPENDENT_AMBULATORY_CARE_PROVIDER_SITE_OTHER): Payer: 59 | Admitting: Family Medicine

## 2017-06-26 VITALS — BP 138/82 | HR 96

## 2017-06-26 DIAGNOSIS — M7061 Trochanteric bursitis, right hip: Secondary | ICD-10-CM | POA: Diagnosis not present

## 2017-06-26 DIAGNOSIS — J4540 Moderate persistent asthma, uncomplicated: Secondary | ICD-10-CM

## 2017-06-26 DIAGNOSIS — R252 Cramp and spasm: Secondary | ICD-10-CM | POA: Diagnosis not present

## 2017-06-26 LAB — CBC
HEMATOCRIT: 44.1 % (ref 35.0–45.0)
HEMOGLOBIN: 13.8 g/dL (ref 11.7–15.5)
MCH: 24.2 pg — ABNORMAL LOW (ref 27.0–33.0)
MCHC: 31.3 g/dL — AB (ref 32.0–36.0)
MCV: 77.2 fL — AB (ref 80.0–100.0)
MPV: 9.4 fL (ref 7.5–12.5)
Platelets: 231 10*3/uL (ref 140–400)
RBC: 5.71 MIL/uL — ABNORMAL HIGH (ref 3.80–5.10)
RDW: 17.7 % — ABNORMAL HIGH (ref 11.0–15.0)
WBC: 7.5 10*3/uL (ref 3.8–10.8)

## 2017-06-26 MED ORDER — PREDNISONE 10 MG PO TABS: 30.0000 mg | ORAL_TABLET | Freq: Every day | ORAL | 0 refills | Status: DC

## 2017-06-26 NOTE — Progress Notes (Signed)
[name] is a 61 y.o. female who presents to [facility] today for right hip pain and hand cramping and congestion.  Right hip pain: Patient was seen about 6 weeks ago for right lateral hip pain thought to be trochanteric bursitis. She received a steroid injection which helped moderately. Additionally she's been doing physical therapy which has helped significantly. She notes that she is about 80% better and both her and the physical therapist think that she benefit from further therapy.  Additionally she notes hand cramping. This is occurred with both hands when she is using her hands a lot. This is been ongoing now for a few weeks. She denies any fevers or chills vomiting or diarrhea.  Lastly patient notes congestion. She notes cough and wheezing. She was seen in urgent care and prescribed prednisone and antibiotics which have helped over the symptoms are slightly returning. She uses albuterol which does help. She notes that she has stopped using her Advair.   Past Medical History:  Diagnosis Date  . Allergy    seasonal  . Anemia   . Arthritis   . Asthma   . 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 Blood transfusion without reported diagnosis    1980's  . Colon cancer ([place]) 2009   colon  . GERD (gastroesophageal reflux disease)   . Hiatal hernia   . Hypertension    Past Surgical History:  Procedure Laterality Date  . [place]  . CHOLECYSTECTOMY  1980  . COLONOSCOPY    . COLONOSCOPY WITH PROPOFOL N/A 10/02/2015   Procedure: COLONOSCOPY WITH PROPOFOL;  Surgeon: Ladene Artist, MD;  Location: [facility];  Service: Endoscopy;  Laterality: N/A;  . PARTIAL COLECTOMY  2009  . POLYPECTOMY    . TUBAL LIGATION  1989   Social History  Substance Use Topics  . Smoking status: Never Smoker  . Smokeless tobacco: Never Used  .  Alcohol use No   family history includes Hypertension in her father and paternal grandfather; Stroke in her paternal grandmother.  ROS as above:  Medications: Current Outpatient Prescriptions  Medication Sig Dispense Refill  . albuterol (PROVENTIL HFA;VENTOLIN HFA) 108 (90 Base) MCG/ACT inhaler Inhale 2 puffs into the lungs every 6 (six) hours as needed for wheezing or shortness of breath. 1 Inhaler 6  . albuterol (PROVENTIL) (2.5 MG/3ML) 0.083% nebulizer solution Take 3 mLs (2.5 mg total) by nebulization every 4 (four) hours as needed for wheezing or shortness of breath (please include nebulizer machine, hoses, and mask if needed.). 30 vial 6  . celecoxib (CELEBREX) 200 MG capsule Take 1 capsule (200 mg total) by mouth 2 (two) times daily. 180 capsule 1  . cetirizine (ZYRTEC) 10 MG tablet Take 10 mg by mouth daily.     . Cholecalciferol (VITAMIN D) 2000 UNITS CAPS Take 2,000 Units by mouth daily.    . fluticasone (FLONASE) 50 MCG/ACT nasal spray Place 2 sprays into both nostrils as needed for allergies or rhinitis. 16 g 6  . Fluticasone-Salmeterol (ADVAIR DISKUS) 250-50 MCG/DOSE AEPB Inhale 1 puff into the lungs 2 (two) times daily. 1 each 11  . glucosamine-chondroitin 500-400 MG tablet Take 1 tablet by mouth daily.     . hydrochlorothiazide (HYDRODIURIL) 25 MG tablet Take 1 tablet (25 mg total) by mouth daily. 90 tablet 3  . metFORMIN (GLUCOPHAGE-XR) 500 MG 24 hr tablet Take 1  tablet (500 mg total) by mouth daily with breakfast. 30 tablet 5  . tiZANidine (ZANAFLEX) 4 MG tablet Take 1 tablet (4 mg total) by mouth at bedtime as needed for muscle spasms. OK to substitute caps if cheaper. 30 tablet 0  . traMADol (ULTRAM) 50 MG tablet Take 1 tablet (50 mg total) by mouth every 6 (six) hours as needed. Takes 1/2 tablet (Patient taking differently: Take 25 mg by mouth every 6 (six) hours as needed. ) 120 tablet 1  . predniSONE (DELTASONE) 10 MG tablet Take 3 tablets (30 mg total) by mouth daily with  breakfast. 15 tablet 0   No current facility-administered medications for this visit.    Allergies  Allergen Reactions  . Augmentin [Amoxicillin-Pot Clavulanate] Diarrhea  . Phentermine     Numbness tingling/feels weird.     Health Maintenance Health Maintenance  Topic Date Due  . INFLUENZA VACCINE  06/26/2018 (Originally 06/03/2017)  . COLONOSCOPY  10/01/2018  . MAMMOGRAM  10/18/2018  . PAP SMEAR  08/01/2021  . TETANUS/TDAP  08/02/2024  . Hepatitis C Screening  Completed  . HIV Screening  Completed     Exam:  BP 138/82   Pulse 96  Gen: Well NAD HEENT: EOMI,  MMM Lungs: Normal work of breathing. CTABL Heart: RRR no MRG Abd: NABS, Soft. Nondistended, Nontender Exts: Brisk capillary refill, warm and well perfused.  Right hip nontender normal motion. Hands bilaterally normal-appearing nontender no triggering. If Tinel's at the carpal tunnel.   No results found for this or any previous visit (from the past 72 hour(s)). No results found.    Assessment and Plan: 61 y.o. female with  Trochanteric bursitis: Improving but would benefit from further physical therapy. Referral authorized.  Hand cramping: Unclear etiology. Plan for laboratory workup listed below. Recheck in 6 weeks.  Cough: Likely asthma related. Recommend patient restart her Advair. I prescribed backup short course prednisone use if patient does not improve.   Orders Placed This Encounter  Procedures  . COMPLETE METABOLIC PANEL WITH GFR  . CBC  . Magnesium  . TSH  . CK  . Ambulatory referral to Physical Therapy    Referral Priority:   Routine    Referral Type:   Physical Medicine    Referral Reason:   Specialty Services Required    Requested Specialty:   Physical Therapy    Number of Visits Requested:   1   Meds ordered this encounter  Medications  . predniSONE (DELTASONE) 10 MG tablet    Sig: Take 3 tablets (30 mg total) by mouth daily with breakfast.    Dispense:  15 tablet    Refill:  0      Discussed warning signs or symptoms. Please see discharge instructions. Patient expresses understanding.

## 2017-06-26 NOTE — Patient Instructions (Signed)
Thank you for coming in today. Recheck in 6 weeks.  We will do labs to check on cramping.  [place] for breathing.  Take prednisone if not better.  Call or go to the emergency room if you get worse, have trouble breathing, have chest pains, or palpitations.    Muscle Cramps and Spasms Muscle cramps and spasms occur when a muscle or muscles tighten and you have no control over this tightening (involuntary muscle contraction). They are a common problem and can develop in any muscle. The most common place is in the calf muscles of the leg. Muscle cramps and muscle spasms are both involuntary muscle contractions, but there are some differences between the two:  Muscle cramps are painful. They come and go and may last a few seconds to 15 minutes. Muscle cramps are often more forceful and last longer than muscle spasms.  Muscle spasms may or may not be painful. They may also last just a few seconds or much longer.  Certain medical conditions, such as diabetes or Parkinson disease, can make it more likely to develop cramps or spasms. However, cramps or spasms are usually not caused by a serious underlying problem. Common causes include:  Overexertion.  Overuse from repetitive motions, or doing the same thing over and over.  Remaining in a certain position for a long period of time.  Improper preparation, form, or technique while playing a sport or doing an activity.  Dehydration.  Injury.  Side effects of some medicines.  Abnormally low levels of the salts and ions in your blood (electrolytes), especially potassium and calcium. This could happen if you are taking water pills (diuretics) or if you are pregnant.  In many cases, the cause of muscle cramps or spasms is unknown. Follow these instructions at home:  Stay well hydrated. Drink enough fluid to keep your urine clear or pale yellow.  Try massaging, stretching, and relaxing the affected muscle.  If directed, apply heat to  tight or tense muscles as often as told by your health care provider. Use the heat source that your health care provider recommends, such as a moist heat pack or a heating pad. ? Place a towel between your skin and the heat source. ? Leave the heat on for 20-30 minutes. ? Remove the heat if your skin turns bright red. This is especially important if you are unable to feel pain, heat, or cold. You may have a greater risk of getting burned.  If directed, put ice on the affected area. This may help if you are sore or have pain after a cramp or spasm. ? Put ice in a plastic bag. ? Place a towel between your skin and the bag. ? Leavethe ice on for 20 minutes, 2-3 times a day.  Take over-the-counter and prescription medicines only as told by your health care provider.  Pay attention to any changes in your symptoms. Contact a health care provider if:  Your cramps or spasms get more severe or happen more often.  Your cramps or spasms do not improve over time. This information is not intended to replace advice given to you by your health care provider. Make sure you discuss any questions you have with your health care provider. Document Released: 04/11/2002 Document Revised: 11/21/2015 Document Reviewed: 07/24/2015 Elsevier Interactive Patient Education  2018 [employer].

## 2017-06-27 LAB — TSH: TSH: 1.02 mIU/L

## 2017-06-27 LAB — COMPLETE METABOLIC PANEL WITH GFR
ALT: 13 U/L (ref 6–29)
AST: 13 U/L (ref 10–35)
Albumin: 3.6 g/dL (ref 3.6–5.1)
Alkaline Phosphatase: 98 U/L (ref 33–130)
BUN: 14 mg/dL (ref 7–25)
CHLORIDE: 101 mmol/L (ref 98–110)
CO2: 24 mmol/L (ref 20–32)
CREATININE: 0.92 mg/dL (ref 0.50–0.99)
Calcium: 9 mg/dL (ref 8.6–10.4)
GFR, Est African American: 78 mL/min (ref >=60)
GFR, Est Non African American: 68 mL/min (ref >=60)
GLUCOSE: 104 mg/dL — AB (ref 65–99)
POTASSIUM: 3.9 mmol/L (ref 3.5–5.3)
SODIUM: 139 mmol/L (ref 135–146)
Total Bilirubin: 0.6 mg/dL (ref 0.2–1.2)
Total Protein: 6.2 g/dL (ref 6.1–8.1)

## 2017-06-27 LAB — MAGNESIUM: Magnesium: 1.9 mg/dL (ref 1.5–2.5)

## 2017-06-27 LAB — CK: Total CK: 80 U/L (ref 29–143)

## 2017-06-29 ENCOUNTER — Ambulatory Visit: Payer: 59 | Admitting: Physical Therapy

## 2017-06-29 DIAGNOSIS — M6281 Muscle weakness (generalized): Secondary | ICD-10-CM

## 2017-06-29 DIAGNOSIS — M25551 Pain in right hip: Secondary | ICD-10-CM | POA: Diagnosis not present

## 2017-06-29 DIAGNOSIS — R262 Difficulty in walking, not elsewhere classified: Secondary | ICD-10-CM

## 2017-06-29 DIAGNOSIS — M545 Low back pain: Secondary | ICD-10-CM

## 2017-06-29 NOTE — Therapy (Signed)
[facility] [address]  [place], [place], [address] Phone: [phone]   Fax:  [phone]  Physical Therapy Treatment  Patient Details  Name: [name] MRN: [id number] Date of Birth: January 15, 1956 Referring Provider: Gregor Hams, MD  Encounter Date: 06/29/2017      PT End of Session - 06/29/17 0805    Visit Number 11   Number of Visits 12   Date for PT Re-Evaluation 07/03/17   Authorization Type [employer] & [employer]   PT Start Time 0805   PT Stop Time 0847   PT Time Calculation (min) 42 min   Activity Tolerance Patient tolerated treatment well   Behavior During Therapy [facility] for tasks assessed/performed      Past Medical History:  Diagnosis Date  . Allergy    seasonal  . Anemia   . Arthritis   . Asthma   . 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 Blood transfusion without reported diagnosis    1980's  . Colon cancer ([place]) 2009   colon  . GERD (gastroesophageal reflux disease)   . Hiatal hernia   . Hypertension     Past Surgical History:  Procedure Laterality Date  . [place]  . CHOLECYSTECTOMY  1980  . COLONOSCOPY    . COLONOSCOPY WITH PROPOFOL N/A 10/02/2015   Procedure: COLONOSCOPY WITH PROPOFOL;  Surgeon: Ladene Artist, MD;  Location: [facility];  Service: Endoscopy;  Laterality: N/A;  . PARTIAL COLECTOMY  2009  . POLYPECTOMY    . TUBAL LIGATION  1989    There were no vitals filed for this visit.      Subjective Assessment - 06/29/17 0811    Subjective Pt stating MD ok with her recerting and trying DN.   Limitations Standing   How long can you stand comfortably? 15-20 minutes   Patient Stated Goals "Be able to walk longer distances and stand for longer periods."   Currently in Pain? No/denies   Pain Score 0-No pain   Pain Onset In the past 7 days                         [facility] Adult PT Treatment/Exercise - 06/29/17 0805      Lumbar Exercises: Standing   Functional Squats 15 reps;3 seconds   Functional Squats Limitations TRX     Knee/Hip Exercises: Aerobic   Nustep lvl 6 x 5' (B UE/LE)     Knee/Hip Exercises: Standing   Hip Flexion Both;10 reps;Knee straight;Stengthening   Hip Flexion Limitations red TB; UE support on back of chair   Hip ADduction Both;10 reps;Strengthening   Hip ADduction Limitations red TB; UE support on back of chair   Hip Abduction Both;10 reps;Knee straight;Stengthening   Abduction Limitations red TB; UE support on back of chair   Hip Extension Both;10 reps;Knee straight;Stengthening   Extension Limitations red TB; UE support on back of chair                PT Education - 06/29/17 0838    Education provided Yes   Education Details HEP addition - standing 4 way SLR   Person(s) Educated Patient   Methods Explanation;Demonstration;Handout   Comprehension Verbalized understanding;Returned demonstration          PT Short Term Goals - 06/04/17 0813      PT SHORT TERM GOAL #1   Title  Independent with initial HEP by 06/05/17   Status Achieved           PT Long Term Goals - 06/25/17 0829      PT LONG TERM GOAL #1   Title Independent with ongoing HEP by 07/03/17   Status Partially Met     PT LONG TERM GOAL #2   Title B hip strength >/= 4/5 for improved proximal stability by 07/03/17   Status Partially Met  met except hip extension & ER     PT LONG TERM GOAL #3   Title Pt will report ability to stand for >/= 30 minutes w/o limitation due to LBP or R hip pain by 07/03/17   Status On-going     PT LONG TERM GOAL #4   Title Pt will report ability to walk for >/= 20-30 minutes w/o limitation due to pain or weakness to allow pt to complete grocery shopping by 07/03/17   Status Partially Met               Plan - 06/29/17 0814    Clinical Impression Statement Pt expressing interest in trying DN, but would prefer to wait until next visit to start. Provided  education on role of dry needling, including precautions and expected response to treatment. Pt thinking she would like to try reducing PT frequency to 1x/wk upon recert (approved by MD at office visit last week), therefore discussed recommended plan for HEP completion aiming for at least 3x/wk for strengthening exercises and daily for stretches.   Rehab Potential Good   Clinical Impairments Affecting Rehab Potential obesity, bilateral knee OA, HTN   PT Treatment/Interventions Patient/family education;ADLs/Self Care Home Management;Therapeutic exercise;Manual techniques;Dry needling;Taping;Electrical Stimulation;Moist Heat;Iontophoresis 3m/ml Dexamethasone;Gait training;Stair training;Neuromuscular re-education   PT Next Visit Plan Recert; proximal flexibility; LE/core strengthening; manual therapy to address increased muscle tension; modalities PRN   Consulted and Agree with Plan of Care Patient      Patient will benefit from skilled therapeutic intervention in order to improve the following deficits and impairments:  Pain, Impaired flexibility, Decreased strength, Increased muscle spasms, Difficulty walking, Decreased activity tolerance, Abnormal gait  Visit Diagnosis: Pain in right hip  Acute right-sided low back pain, with sciatica presence unspecified  Muscle weakness (generalized)  Difficulty in walking, not elsewhere classified     Problem List Patient Active Problem List   Diagnosis Date Noted  . Trochanteric bursitis of right hip 05/14/2017  . Morbid obesity ([place] 08/01/2016  . IFG (impaired fasting glucose) 07/14/2016  . Primary osteoarthritis of both hands 08/03/2015  . Elevated LDL cholesterol level 09/16/2013  . Trigger thumb of left hand 09/16/2013  . Knee osteoarthritis 09/16/2013  . HTN (hypertension), benign 09/16/2013  . IDA (iron deficiency anemia) 09/16/2013  . Asthma 09/16/2013  . Unspecified vitamin D deficiency 09/16/2013  . MALIGNANT NEOPLASM OF  TRANSVERSE COLON 07/04/2008    JPercival Spanish PT, MPT 06/29/2017, 8:53 AM  [facility] [place] [address]Phone: [phone]  Fax:  [phone] Name: Christina WILBORNMRN: [id number] of Birth: 103-21-1957

## 2017-06-29 NOTE — Patient Instructions (Addendum)

## 2017-07-02 ENCOUNTER — Ambulatory Visit: Payer: 59 | Admitting: Physical Therapy

## 2017-07-02 DIAGNOSIS — M25551 Pain in right hip: Secondary | ICD-10-CM | POA: Diagnosis not present

## 2017-07-02 DIAGNOSIS — M6281 Muscle weakness (generalized): Secondary | ICD-10-CM

## 2017-07-02 DIAGNOSIS — M545 Low back pain: Secondary | ICD-10-CM

## 2017-07-02 DIAGNOSIS — R262 Difficulty in walking, not elsewhere classified: Secondary | ICD-10-CM

## 2017-07-02 NOTE — Therapy (Signed)
[facility] [address]  [place], [place], [address] Phone: [phone]   Fax:  [phone]  Physical Therapy Treatment  Patient Details  Name: [name] MRN: [id number] Date of Birth: 11/21/1955 Referring Provider: Gregor Hams, MD  Encounter Date: 07/02/2017      PT End of Session - 07/02/17 0817    Visit Number 12   Number of Visits 18   Date for PT Re-Evaluation 08/14/17   Authorization Type [employer] & [employer]   PT Start Time 0814   PT Stop Time 0909   PT Time Calculation (min) 55 min   Activity Tolerance Patient tolerated treatment well   Behavior During Therapy [facility] for tasks assessed/performed      Past Medical History:  Diagnosis Date  . Allergy    seasonal  . Anemia   . Arthritis   . Asthma   . 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 Blood transfusion without reported diagnosis    1980's  . Colon cancer ([place]) 2009   colon  . GERD (gastroesophageal reflux disease)   . Hiatal hernia   . Hypertension     Past Surgical History:  Procedure Laterality Date  . [place]  . CHOLECYSTECTOMY  1980  . COLONOSCOPY    . COLONOSCOPY WITH PROPOFOL N/A 10/02/2015   Procedure: COLONOSCOPY WITH PROPOFOL;  Surgeon: Ladene Artist, MD;  Location: [facility];  Service: Endoscopy;  Laterality: N/A;  . PARTIAL COLECTOMY  2009  . POLYPECTOMY    . TUBAL LIGATION  1989    There were no vitals filed for this visit.      Subjective Assessment - 07/02/17 0816    Subjective Pt "a little anxious about trying the needling".   Limitations Standing;Walking   How long can you stand comfortably? 15-20 minutes   How long can you walk comfortably? up to 30 minutes   Patient Stated Goals "Be able to walk longer distances and stand for longer periods."   Currently in Pain? Yes   Pain Score 4    Pain Location Buttocks   Pain Orientation Right;Posterior;Lateral   Pain Descriptors  / Indicators Cramping   Pain Type Acute pain;Chronic pain   Aggravating Factors  prolonged standing   Pain Relieving Factors sitting with back support, heat   Effect of Pain on Daily Activities limits standing tolerance, avoids stairs            [facility] PT Assessment - 07/02/17 0814      Assessment   Medical Diagnosis R hip trochanteric bursitis & LBP   Referring Provider Gregor Hams, MD   Next MD Visit 08/05/17     Prior Function   Level of Independence Independent   Vocation Full time employment   Vocation Requirements mostly sitting   Leisure reading, has treadmill & elliptical/rec bike combo - not currently using these     Observation/Other Assessments   Focus on Therapeutic Outcomes (FOTO)  Hip 62% (38% limitation)  as of 06/25/17     AROM   Overall AROM  Within functional limits for tasks performed   Overall AROM Comments with restrictions due to obesity   AROM Assessment Site Lumbar     Strength   Right Hip Flexion 4/5   Right Hip Extension 4-/5   Right Hip External Rotation  4-/5   Right Hip Internal Rotation 4+/5   Right Hip ABduction  4/5   Right Hip ADduction 4/5   Left Hip Flexion 4/5   Left Hip Extension 4-/5   Left Hip External Rotation 4-/5   Left Hip Internal Rotation 4+/5   Left Hip ABduction 4/5   Left Hip ADduction 4/5                     [facility] Adult PT Treatment/Exercise - 07/02/17 0814      Knee/Hip Exercises: Aerobic   Nustep lvl 6 x 5' (B UE/LE)     Modalities   Modalities Electrical Stimulation;Moist Heat     Moist Heat Therapy   Number Minutes Moist Heat 15 Minutes   Moist Heat Location --  R buttock     Electrical Stimulation   Electrical Stimulation Location R buttock (glutes & piriformis)   Electrical Stimulation Action IFC   Electrical Stimulation Parameters 80-150 Hz, intensity to pt tolerance x15'   Electrical Stimulation Goals Pain;Tone     Manual Therapy   Manual Therapy Soft tissue mobilization   Manual  therapy comments pt prone   Soft tissue mobilization R glutes & piriformis          Trigger Point Dry Needling - 07/02/17 0814    Consent Given? Yes   Education Handout Provided Yes   Muscles Treated Lower Body Gluteus minimus;Gluteus maximus;Piriformis   Gluteus Maximus Response Twitch response elicited;Palpable increased muscle length   Gluteus Minimus Response Twitch response elicited;Palpable increased muscle length   Piriformis Response Twitch response elicited;Palpable increased muscle length              PT Education - 07/02/17 0830    Education provided Yes   Education Details Role of dry needling & post-treatment expectations    Person(s) Educated Patient   Methods Explanation;Handout   Comprehension Verbalized understanding          PT Short Term Goals - 06/04/17 0813      PT SHORT TERM GOAL #1   Title Independent with initial HEP by 06/05/17   Status Achieved           PT Long Term Goals - 07/02/17 0817      PT LONG TERM GOAL #1   Title Independent with ongoing HEP    Status Partially Met   Target Date 08/14/17     PT LONG TERM GOAL #2   Title B hip strength >/= 4/5 for improved proximal stability    Status Partially Met  met except hip extension & ER   Target Date 08/14/17     PT LONG TERM GOAL #3   Title Pt will report ability to stand for >/= 30 minutes w/o limitation due to LBP or R hip pain    Status On-going   Target Date 08/14/17     PT LONG TERM GOAL #4   Title Pt will report ability to walk for >/= 20-30 minutes w/o limitation due to pain or weakness to allow pt to complete grocery shopping    Status Partially Met   Target Date 08/14/17               Plan - 07/02/17 0824    Clinical Impression Statement [name] has demonstrated good initial progress with PT with lessening pain and increased proximal LE strength. Pain temporarily resolved while on prednisone for lung function but has noted return of pain in R buttock since  stopping prednisone. Increased muscle tension with ttp persisting in deep buttock musculature, esp glute medius/minimus and  piriformis, therefore initiated trial of dry needling today after education provided and consent received from pt. Postive response to DN noted with decreased muscle tension noted following treatment, altough some tenderness still present in piriformis muscle. Goals only partially met at this time and given continued deficits and positive response to DN, recommend recert for additonal 4-6 wks with frequency reduced to 1x/wk to continue to work on reducing muscle tension and pain along with core and proximal LE strengtheing.   Rehab Potential Good   Clinical Impairments Affecting Rehab Potential obesity, bilateral knee OA, HTN   PT Frequency 1x / week   PT Duration 6 weeks   PT Treatment/Interventions Patient/family education;ADLs/Self Care Home Management;Therapeutic exercise;Manual techniques;Dry needling;Taping;Electrical Stimulation;Moist Heat;Iontophoresis 25m/ml Dexamethasone;Gait training;Stair training;Neuromuscular re-education   PT Next Visit Plan Recert; proximal flexibility; LE/core strengthening; manual therapy to address increased muscle tension; modalities PRN   Consulted and Agree with Plan of Care Patient      Patient will benefit from skilled therapeutic intervention in order to improve the following deficits and impairments:  Pain, Impaired flexibility, Decreased strength, Increased muscle spasms, Difficulty walking, Decreased activity tolerance, Abnormal gait  Visit Diagnosis: Pain in right hip  Acute right-sided low back pain, with sciatica presence unspecified  Muscle weakness (generalized)  Difficulty in walking, not elsewhere classified     Problem List Patient Active Problem List   Diagnosis Date Noted  . Trochanteric bursitis of right hip 05/14/2017  . Morbid obesity ([place] 08/01/2016  . IFG (impaired fasting glucose) 07/14/2016  . Primary  osteoarthritis of both hands 08/03/2015  . Elevated LDL cholesterol level 09/16/2013  . Trigger thumb of left hand 09/16/2013  . Knee osteoarthritis 09/16/2013  . HTN (hypertension), benign 09/16/2013  . IDA (iron deficiency anemia) 09/16/2013  . Asthma 09/16/2013  . Unspecified vitamin D deficiency 09/16/2013  . 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    JPercival Spanish PT, MPT 07/02/2017, 10:02 AM  [facility] [place] [address]Phone: [phone]  Fax:  [phone] Name: Christina GAMBRELLMRN: [id number] of Birth: [birth year]

## 2017-07-02 NOTE — Patient Instructions (Signed)

## 2017-07-08 ENCOUNTER — Ambulatory Visit: Payer: 59 | Attending: Family Medicine | Admitting: Physical Therapy

## 2017-07-08 DIAGNOSIS — R262 Difficulty in walking, not elsewhere classified: Secondary | ICD-10-CM | POA: Diagnosis present

## 2017-07-08 DIAGNOSIS — M6281 Muscle weakness (generalized): Secondary | ICD-10-CM | POA: Diagnosis present

## 2017-07-08 DIAGNOSIS — M545 Low back pain: Secondary | ICD-10-CM | POA: Insufficient documentation

## 2017-07-08 DIAGNOSIS — M25551 Pain in right hip: Secondary | ICD-10-CM | POA: Diagnosis not present

## 2017-07-08 NOTE — Therapy (Signed)
[facility] [address]  [place], [place], [address] Phone: [phone]   Fax:  [phone]  Physical Therapy Treatment  Patient Details  Name: [name] MRN: [id number] Date of Birth: 14-Sep-1956 Referring Provider: Gregor Hams, MD  Encounter Date: 07/08/2017      PT End of Session - 07/08/17 0800    Visit Number 13   Number of Visits 18   Date for PT Re-Evaluation 08/14/17   Authorization Type [employer] & [employer]   PT Start Time 0800   PT Stop Time 0901   PT Time Calculation (min) 61 min   Activity Tolerance Patient tolerated treatment well   Behavior During Therapy [facility] for tasks assessed/performed      Past Medical History:  Diagnosis Date  . Allergy    seasonal  . Anemia   . Arthritis   . Asthma   . 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 Blood transfusion without reported diagnosis    1980's  . Colon cancer ([place]) 2009   colon  . GERD (gastroesophageal reflux disease)   . Hiatal hernia   . Hypertension     Past Surgical History:  Procedure Laterality Date  . [place]  . CHOLECYSTECTOMY  1980  . COLONOSCOPY    . COLONOSCOPY WITH PROPOFOL N/A 10/02/2015   Procedure: COLONOSCOPY WITH PROPOFOL;  Surgeon: Ladene Artist, MD;  Location: [facility];  Service: Endoscopy;  Laterality: N/A;  . PARTIAL COLECTOMY  2009  . POLYPECTOMY    . TUBAL LIGATION  1989    There were no vitals filed for this visit.      Subjective Assessment - 07/08/17 0803    Subjective Pt reports her buttock "was good all weekend" after the DN even with walking around while shopping. Noting some pain last night disrupting sleep.   Patient Stated Goals "Be able to walk longer distances and stand for longer periods."   Pain Score 4    Pain Location Buttocks   Pain Orientation Right;Posterior;Lateral   Pain Descriptors / Indicators Cramping   Pain Type Acute pain;Chronic pain   Pain  Frequency Intermittent                         [facility] Adult PT Treatment/Exercise - 07/08/17 0800      Lumbar Exercises: Stretches   ITB Stretch 30 seconds;2 reps   ITB Stretch Limitations supine with strap   Piriformis Stretch 30 seconds;2 reps  each   Piriformis Stretch Limitations supine KTOS & figure 4 to chest with PT assist     Lumbar Exercises: Supine   Clam 15 reps;5 seconds   Clam Limitations hooklying alt hip ABD/ER with green TB   Bridge 10 reps;3 seconds   Bridge Limitations + hip ABD isometric with green TB; limited lift     Knee/Hip Exercises: Aerobic   Nustep lvl 5 x 5' (B UE/LE)     Modalities   Modalities Electrical Stimulation;Moist Heat     Moist Heat Therapy   Number Minutes Moist Heat 15 Minutes   Moist Heat Location --  R buttock     Electrical Stimulation   Electrical Stimulation Location R buttock (glutes & piriformis)   Electrical Stimulation Action IFC   Electrical Stimulation Parameters 80-150 Hz, intensity to pt tolerance x15'   Electrical Stimulation Goals Pain;Tone     Manual Therapy  Manual Therapy Soft tissue mobilization;Myofascial release   Manual therapy comments pt prone   Soft tissue mobilization R glutes & piriformis   Myofascial Release TPR with PT's elbow to piriformis & glut minimus          Trigger Point Dry Needling - 07/08/17 0800    Consent Given? Yes   Muscles Treated Lower Body Gluteus minimus;Gluteus maximus;Piriformis   Gluteus Maximus Response Twitch response elicited;Palpable increased muscle length   Gluteus Minimus Response Twitch response elicited;Palpable increased muscle length   Piriformis Response Twitch response elicited;Palpable increased muscle length                PT Short Term Goals - 06/04/17 0813      PT SHORT TERM GOAL #1   Title Independent with initial HEP by 06/05/17   Status Achieved           PT Long Term Goals - 07/02/17 0817      PT LONG TERM GOAL #1    Title Independent with ongoing HEP    Status Partially Met   Target Date 08/14/17     PT LONG TERM GOAL #2   Title B hip strength >/= 4/5 for improved proximal stability    Status Partially Met  met except hip extension & ER   Target Date 08/14/17     PT LONG TERM GOAL #3   Title Pt will report ability to stand for >/= 30 minutes w/o limitation due to LBP or R hip pain    Status On-going   Target Date 08/14/17     PT LONG TERM GOAL #4   Title Pt will report ability to walk for >/= 20-30 minutes w/o limitation due to pain or weakness to allow pt to complete grocery shopping    Status Partially Met   Target Date 08/14/17               Plan - 07/08/17 0806    Clinical Impression Statement Pt reporting positive response to DN last session with decreased pain lasting through the weekend. Repeated DN today with greatest tension noted in R piriformis and glute minimus, followed by stretching and strengthening exercises for these muscles, Treatment concluded with estim and moist heat to promote further relaxation as pt noting benefit from this visit..   Rehab Potential Good   Clinical Impairments Affecting Rehab Potential obesity, bilateral knee OA, HTN   PT Frequency 1x / week   PT Duration 6 weeks   PT Treatment/Interventions Patient/family education;ADLs/Self Care Home Management;Therapeutic exercise;Manual techniques;Dry needling;Taping;Electrical Stimulation;Moist Heat;Iontophoresis 32m/ml Dexamethasone;Gait training;Stair training;Neuromuscular re-education   PT Next Visit Plan proximal flexibility; LE/core strengthening; manual therapy to address increased muscle tension; modalities PRN   Consulted and Agree with Plan of Care Patient      Patient will benefit from skilled therapeutic intervention in order to improve the following deficits and impairments:  Pain, Impaired flexibility, Decreased strength, Increased muscle spasms, Difficulty walking, Decreased activity tolerance,  Abnormal gait  Visit Diagnosis: Pain in right hip  Acute right-sided low back pain, with sciatica presence unspecified  Muscle weakness (generalized)  Difficulty in walking, not elsewhere classified     Problem List Patient Active Problem List   Diagnosis Date Noted  . Trochanteric bursitis of right hip 05/14/2017  . [place] 08/01/2016  . IFG (impaired fasting glucose) 07/14/2016  . Primary osteoarthritis of both hands 08/03/2015  . Elevated LDL cholesterol level 09/16/2013  . Trigger thumb of left hand 09/16/2013  .  Knee osteoarthritis 09/16/2013  . HTN (hypertension), benign 09/16/2013  . IDA (iron deficiency anemia) 09/16/2013  . Asthma 09/16/2013  . Unspecified vitamin D deficiency 09/16/2013  . 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    Percival Spanish, PT, MPT 07/08/2017, 9:00 AM  [facility] [place], [place], [address] Phone: [phone]   Fax:  [phone]  Name: BONETTA MOSTEK MRN: [id number] Date of Birth: December 01, 1955

## 2017-07-09 NOTE — Telephone Encounter (Signed)
Received the following information from OV benefits investigation:   Prior Authorization Not Required  Physician Purchase Not Available  Specialty Pharmacy Required :[facility]: [phone]  PLAN ACCUMULATORS COPAY PCP: 0.00  Specialist: 0.00  COINSURANCE 20.0%  DEDUCTIBLE (AMOUNT MET) Individual: 1,350.00 (1,350.00)  Family: 2,700.00 (1,350.00)  MOOP (AMOUNT MET) These are the patient's individual and family max out-of-pocket limits (plan year amount and amount met to date) for this policy, when applicable. Unknown means this information was not supplied by the insurance carrier. Individual: 4,050.00 (1,642.39)  Family: 8,100.00 (1,642.39)   Left VM to see if Pt still interested in OV injections, if so we will send Rx.

## 2017-07-10 NOTE — Telephone Encounter (Signed)
OV benefits was submitted under Primary insurance only. Resubmitted using primary and secondary insurance. Pt advised of current status.

## 2017-07-13 ENCOUNTER — Ambulatory Visit: Payer: 59 | Admitting: Physical Therapy

## 2017-07-13 DIAGNOSIS — R262 Difficulty in walking, not elsewhere classified: Secondary | ICD-10-CM

## 2017-07-13 DIAGNOSIS — M25551 Pain in right hip: Secondary | ICD-10-CM

## 2017-07-13 DIAGNOSIS — M545 Low back pain: Secondary | ICD-10-CM

## 2017-07-13 DIAGNOSIS — M6281 Muscle weakness (generalized): Secondary | ICD-10-CM

## 2017-07-13 NOTE — Therapy (Signed)
[facility] [address]  [place], [place], [address] Phone: [phone]   Fax:  [phone]  Physical Therapy Treatment  Patient Details  Name: [name] MRN: [id number] Date of Birth: 1955/11/26 Referring Provider: Gregor Hams, MD  Encounter Date: 07/13/2017      PT End of Session - 07/13/17 0804    Visit Number 14   Number of Visits 18   Date for PT Re-Evaluation 08/14/17   Authorization Type [employer] & [employer]   PT Start Time 0804   PT Stop Time 0905   PT Time Calculation (min) 61 min   Activity Tolerance Patient tolerated treatment well   Behavior During Therapy [facility] for tasks assessed/performed      Past Medical History:  Diagnosis Date  . Allergy    seasonal  . Anemia   . Arthritis   . Asthma   . 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 Blood transfusion without reported diagnosis    1980's  . Colon cancer ([place]) 2009   colon  . GERD (gastroesophageal reflux disease)   . Hiatal hernia   . Hypertension     Past Surgical History:  Procedure Laterality Date  . [place]  . CHOLECYSTECTOMY  1980  . COLONOSCOPY    . COLONOSCOPY WITH PROPOFOL N/A 10/02/2015   Procedure: COLONOSCOPY WITH PROPOFOL;  Surgeon: Ladene Artist, MD;  [facility]: [facility];  Service: Endoscopy;  Laterality: N/A;  . PARTIAL COLECTOMY  2009  . POLYPECTOMY    . TUBAL LIGATION  1989    There were no vitals filed for this visit.      Subjective Assessment - 07/13/17 0808    Subjective Pt reporting buttock/back only with prolonged standing in the past few days, but knees are hurting today.   Patient Stated Goals "Be able to walk longer distances and stand for longer periods."   Currently in Pain? Yes   Pain Score 0-No pain  up to 5-6/10 with pressure on area   Pain Location Buttocks   Pain Orientation Right;Posterior;Lateral   Pain Descriptors / Indicators Tightness   Pain Type  Acute pain;Chronic pain   Aggravating Factors  pressure with sitting; prolonged standing   Pain Relieving Factors OTC pain meds; stretching   Pain Score 6   Pain Location Knee   Pain Orientation Right;Left   Pain Descriptors / Indicators Aching   Pain Type Acute pain;Chronic pain   Aggravating Factors  weather   Pain Relieving Factors OTC pain meds                         [facility] Adult PT Treatment/Exercise - 07/13/17 0804      Lumbar Exercises: Stretches   Piriformis Stretch 30 seconds;2 reps   Piriformis Stretch Limitations seated with LE ER on edge of seat and leaning forward     Lumbar Exercises: Standing   Wall Slides 10 reps;5 seconds   Wall Slides Limitations + hip ABD isometric with green TB     Knee/Hip Exercises: Aerobic   Nustep lvl 6 x 5' (B UE/LE)     Knee/Hip Exercises: Standing   Other Standing Knee Exercises B side stepping with looped red TB at ankles x20 ft     Modalities   Modalities Electrical Stimulation;Moist Heat     Moist Heat Therapy   Number Minutes Moist Heat 15 Minutes  Moist Heat Location --  R buttock     Electrical Stimulation   Electrical Stimulation Location R buttock (glutes & piriformis)   Electrical Stimulation Action IFC   Electrical Stimulation Parameters 80-150 Hz, intensity to pt tolerance x15'   Electrical Stimulation Goals Pain;Tone     Manual Therapy   Manual Therapy Soft tissue mobilization;Myofascial release   Manual therapy comments pt prone   Soft tissue mobilization R glutes & piriformis   Myofascial Release TPR with PT's elbow to piriformis & glut minimus          Trigger Point Dry Needling - 07/13/17 0804    Consent Given? Yes   Muscles Treated Lower Body Gluteus minimus;Gluteus maximus;Piriformis   Gluteus Maximus Response Twitch response elicited;Palpable increased muscle length   Gluteus Minimus Response Twitch response elicited;Palpable increased muscle length   Piriformis Response Twitch  response elicited;Palpable increased muscle length                PT Short Term Goals - 06/04/17 0813      PT SHORT TERM GOAL #1   Title Independent with initial HEP by 06/05/17   Status Achieved           PT Long Term Goals - 07/02/17 0817      PT LONG TERM GOAL #1   Title Independent with ongoing HEP    Status Partially Met   Target Date 08/14/17     PT LONG TERM GOAL #2   Title B hip strength >/= 4/5 for improved proximal stability    Status Partially Met  met except hip extension & ER   Target Date 08/14/17     PT LONG TERM GOAL #3   Title Pt will report ability to stand for >/= 30 minutes w/o limitation due to LBP or R hip pain    Status On-going   Target Date 08/14/17     PT LONG TERM GOAL #4   Title Pt will report ability to walk for >/= 20-30 minutes w/o limitation due to pain or weakness to allow pt to complete grocery shopping    Status Partially Met   Target Date 08/14/17               Plan - 07/13/17 6301    Clinical Impression Statement Pt reporting decreased R buttock pain since last DN treatment, with pain now only present with prolonged standing or with deep pressure to buttocks, however pt noting more knee pain today (thinks it's weather related). DN performed again today with postive response. Strengthening exercises focused on glutes and proximal LE to promote proximal stability for low back and knees.   Rehab Potential Good   Clinical Impairments Affecting Rehab Potential obesity, bilateral knee OA, HTN   PT Frequency 1x / week   PT Duration 6 weeks   PT Treatment/Interventions Patient/family education;ADLs/Self Care Home Management;Therapeutic exercise;Manual techniques;Dry needling;Taping;Electrical Stimulation;Moist Heat;Iontophoresis 33m/ml Dexamethasone;Gait training;Stair training;Neuromuscular re-education   PT Next Visit Plan proximal flexibility; LE/core strengthening; manual therapy to address increased muscle tension;  modalities PRN   Consulted and Agree with Plan of Care Patient      Patient will benefit from skilled therapeutic intervention in order to improve the following deficits and impairments:  Pain, Impaired flexibility, Decreased strength, Increased muscle spasms, Difficulty walking, Decreased activity tolerance, Abnormal gait  Visit Diagnosis: Pain in right hip  Acute right-sided low back pain, with sciatica presence unspecified  Muscle weakness (generalized)  Difficulty in walking, not elsewhere classified  Problem List Patient Active Problem List   Diagnosis Date Noted  . Trochanteric bursitis of right hip 05/14/2017  . Morbid obesity ([place]) 08/01/2016  . IFG (impaired fasting glucose) 07/14/2016  . Primary osteoarthritis of both hands 08/03/2015  . Elevated LDL cholesterol level 09/16/2013  . Trigger thumb of left hand 09/16/2013  . Knee osteoarthritis 09/16/2013  . HTN (hypertension), benign 09/16/2013  . IDA (iron deficiency anemia) 09/16/2013  . Asthma 09/16/2013  . Unspecified vitamin D deficiency 09/16/2013  . 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    Percival Spanish, PT, MPT 07/13/2017, 9:08 AM  [facility] [address]  [place], [place], [address] Phone: [phone]   Fax:  [phone]  Name: [name] MRN: [id number] Date of Birth: Aug 12, 1956

## 2017-07-20 ENCOUNTER — Ambulatory Visit: Payer: 59 | Admitting: Physical Therapy

## 2017-07-20 DIAGNOSIS — M25551 Pain in right hip: Secondary | ICD-10-CM

## 2017-07-20 DIAGNOSIS — M545 Low back pain: Secondary | ICD-10-CM

## 2017-07-20 DIAGNOSIS — R262 Difficulty in walking, not elsewhere classified: Secondary | ICD-10-CM

## 2017-07-20 DIAGNOSIS — M6281 Muscle weakness (generalized): Secondary | ICD-10-CM

## 2017-07-20 NOTE — Therapy (Signed)
[facility] [address]  [place], [place], [address] Phone: [phone]   Fax:  [phone]  Physical Therapy Treatment  Patient Details  Name: [name] MRN: [id number] Date of Birth: 1956-05-04 Referring Provider: Gregor Hams, MD  Encounter Date: 07/20/2017      PT End of Session - 07/20/17 0808    Visit Number 15   Number of Visits 18   Date for PT Re-Evaluation 08/14/17   Authorization Type [employer] & [employer]   PT Start Time 0808  pt arrived late   PT Stop Time 0851   PT Time Calculation (min) 43 min   Activity Tolerance Patient tolerated treatment well   Behavior During Therapy [facility] for tasks assessed/performed      Past Medical History:  Diagnosis Date  . Allergy    seasonal  . Anemia   . Arthritis   . Asthma   . 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 Blood transfusion without reported diagnosis    1980's  . Colon cancer ([place]) 2009   colon  . GERD (gastroesophageal reflux disease)   . Hiatal hernia   . Hypertension     Past Surgical History:  Procedure Laterality Date  . [place]  . CHOLECYSTECTOMY  1980  . COLONOSCOPY    . COLONOSCOPY WITH PROPOFOL N/A 10/02/2015   Procedure: COLONOSCOPY WITH PROPOFOL;  Surgeon: Ladene Artist, MD;  [facility];  Service: Endoscopy;  Laterality: N/A;  . PARTIAL COLECTOMY  2009  . POLYPECTOMY    . TUBAL LIGATION  1989    There were no vitals filed for this visit.      Subjective Assessment - 07/20/17 0810    Subjective Pt reporting "my butt is not really bothering me today, it's just my knees that are sore."   Patient Stated Goals "Be able to walk longer distances and stand for longer periods."   Currently in Pain? Yes   Pain Score 0-No pain   Pain Location Buttocks   Pain Orientation Right   Pain Score 4   Pain Location Knee   Pain Orientation Right;Left   Pain Descriptors / Indicators Sore    Pain Type Acute pain;Chronic pain   Pain Radiating Towards L thigh                         [facility] Adult PT Treatment/Exercise - 07/20/17 0808      Lumbar Exercises: Stretches   Hip Flexor Stretch 30 seconds;3 reps   Hip Flexor Stretch Limitations mod thomas with strap   Quad Stretch 30 seconds;3 reps   Quad Stretch Limitations prone with strap & knee elevated on pillow     Lumbar Exercises: Standing   Functional Squats 15 reps;3 seconds   Functional Squats Limitations TRX     Lumbar Exercises: Quadruped   Single Arm Raise Right;Left;10 reps;2 seconds   Single Arm Raises Limitations torso supported on peanut ball   Straight Leg Raise 10 reps;3 seconds   Straight Leg Raises Limitations torso supported on peanut ball     Knee/Hip Exercises: Aerobic   Nustep lvl 6 x 5' (B UE/LE)     Knee/Hip Exercises: Standing   Other Standing Knee Exercises B side stepping with looped red TB at ankles x20 ft     Manual Therapy   Manual Therapy Soft tissue mobilization;Myofascial release  Manual therapy comments pt prone   Soft tissue mobilization L proximal quads, esp RF   Myofascial Release TPR proximal RF                PT Education - 07/20/17 0933    Education provided Yes   Education Details HEP update   Person(s) Educated Patient   Methods Explanation;Demonstration;Handout   Comprehension Verbalized understanding;Returned demonstration;Need further instruction          PT Short Term Goals - 06/04/17 0813      PT SHORT TERM GOAL #1   Title Independent with initial HEP by 06/05/17   Status Achieved           PT Long Term Goals - 07/02/17 0817      PT LONG TERM GOAL #1   Title Independent with ongoing HEP    Status Partially Met   Target Date 08/14/17     PT LONG TERM GOAL #2   Title B hip strength >/= 4/5 for improved proximal stability    Status Partially Met  met except hip extension & ER   Target Date 08/14/17     PT LONG TERM GOAL #3    Title Pt will report ability to stand for >/= 30 minutes w/o limitation due to LBP or R hip pain    Status On-going   Target Date 08/14/17     PT LONG TERM GOAL #4   Title Pt will report ability to walk for >/= 20-30 minutes w/o limitation due to pain or weakness to allow pt to complete grocery shopping    Status Partially Met   Target Date 08/14/17               Plan - 07/20/17 0813    Clinical Impression Statement Pt reporting improvement in R buttock pain, but noting L proximal thigh pain today. Palpation revealed taut band with TP in proximal RF, therefore targeted STM/MFR to this area followed by streching to quads and hip flexors. Continued focus on core & proximal strengthening with fatigue noted but no increased pain. HEP updated to include RF/quad stretch & side-stepping with band.   Rehab Potential Good   Clinical Impairments Affecting Rehab Potential obesity, bilateral knee OA, HTN   PT Frequency 1x / week   PT Duration 6 weeks   PT Treatment/Interventions Patient/family education;ADLs/Self Care Home Management;Therapeutic exercise;Manual techniques;Dry needling;Taping;Electrical Stimulation;Moist Heat;Iontophoresis 4m/ml Dexamethasone;Gait training;Stair training;Neuromuscular re-education   PT Next Visit Plan proximal flexibility; LE/core strengthening; manual therapy to address increased muscle tension; modalities PRN   Consulted and Agree with Plan of Care Patient      Patient will benefit from skilled therapeutic intervention in order to improve the following deficits and impairments:  Pain, Impaired flexibility, Decreased strength, Increased muscle spasms, Difficulty walking, Decreased activity tolerance, Abnormal gait  Visit Diagnosis: Pain in right hip  Acute right-sided low back pain, with sciatica presence unspecified  Muscle weakness (generalized)  Difficulty in walking, not elsewhere classified     Problem List Patient Active Problem List    Diagnosis Date Noted  . Trochanteric bursitis of right hip 05/14/2017  . [place] 08/01/[place]  . IFG (impaired fasting glucose) 07/14/2016  . Primary osteoarthritis of both hands 08/03/2015  . Elevated LDL cholesterol level 09/16/2013  . Trigger thumb of left hand 09/16/2013  . Knee osteoarthritis 09/16/2013  . HTN (hypertension), benign 09/16/2013  . IDA (iron deficiency anemia) 09/16/2013  . Asthma 09/16/2013  . Unspecified vitamin D deficiency 09/16/2013  .  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    Percival Spanish, PT, MPT 07/20/2017, 9:39 AM  [facility] [address]  [place], [place], [address] Phone: [phone]   Fax:  [phone]  Name: AKEYLA MOLDEN MRN: [id number] Date of Birth: 1956-06-11

## 2017-07-23 ENCOUNTER — Telehealth: Payer: Self-pay

## 2017-07-23 NOTE — Telephone Encounter (Signed)
Patient has been tried to be reached by imaging for her follow up ultrasound. Patient reached and says she is feeling better and doesn't think she needs ultrasound.  Will route to provider for input on plan of care. Kathrene Alu RNBSN

## 2017-07-24 ENCOUNTER — Telehealth: Payer: Self-pay

## 2017-07-24 NOTE — Telephone Encounter (Signed)
Attempted to reach patient in regards to her follow up ultrasound. Per Dr. Ihor Dow patient needs follow up ultrasound. Kathrene Alu RNBSN

## 2017-07-27 ENCOUNTER — Ambulatory Visit: Payer: 59 | Admitting: Physical Therapy

## 2017-07-28 ENCOUNTER — Telehealth: Payer: Self-pay

## 2017-07-28 NOTE — Telephone Encounter (Signed)
Patient returned call to office. Patient made aware that she should schedule her follow up ultrasound even if she is feeling better. Patient transferred to imaging to schedule appointment. Kathrene Alu RNBSN

## 2017-07-31 ENCOUNTER — Ambulatory Visit (HOSPITAL_BASED_OUTPATIENT_CLINIC_OR_DEPARTMENT_OTHER)
Admission: RE | Admit: 2017-07-31 | Discharge: 2017-07-31 | Disposition: A | Discharge status: Home / Self Care | Payer: 59 | Source: Ambulatory Visit | Attending: Obstetrics & Gynecology | Admitting: Obstetrics & Gynecology

## 2017-07-31 DIAGNOSIS — D259 Leiomyoma of uterus, unspecified: Secondary | ICD-10-CM | POA: Insufficient documentation

## 2017-07-31 DIAGNOSIS — N83291 Other ovarian cyst, right side: Secondary | ICD-10-CM

## 2017-07-31 DIAGNOSIS — Z09 Encounter for follow-up examination after completed treatment for conditions other than malignant neoplasm: Secondary | ICD-10-CM | POA: Insufficient documentation

## 2017-08-03 ENCOUNTER — Ambulatory Visit: Payer: 59 | Attending: Family Medicine | Admitting: Physical Therapy

## 2017-08-03 DIAGNOSIS — M545 Low back pain: Secondary | ICD-10-CM | POA: Insufficient documentation

## 2017-08-03 DIAGNOSIS — M6281 Muscle weakness (generalized): Secondary | ICD-10-CM | POA: Diagnosis present

## 2017-08-03 DIAGNOSIS — M25551 Pain in right hip: Secondary | ICD-10-CM | POA: Insufficient documentation

## 2017-08-03 DIAGNOSIS — R262 Difficulty in walking, not elsewhere classified: Secondary | ICD-10-CM | POA: Diagnosis present

## 2017-08-03 NOTE — Therapy (Signed)
[facility] [address]  [place], [place], [address] Phone: [phone]   Fax:  [phone]  Physical Therapy Treatment  Patient Details  Name: [name] MRN: [id number] Date of Birth: 1956-03-20 Referring Provider: Gregor Hams, MD  Encounter Date: 08/03/2017      PT End of Session - 08/03/17 0804    Visit Number 16   Number of Visits 18   Date for PT Re-Evaluation 08/14/17   Authorization Type UHC & [employer]   PT Start Time 0804   PT Stop Time 0848   PT Time Calculation (min) 44 min   Activity Tolerance Patient tolerated treatment well   Behavior During Therapy [facility] for tasks assessed/performed      Past Medical History:  Diagnosis Date  . Allergy    seasonal  . Anemia   . Arthritis   . Asthma   . 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 Blood transfusion without reported diagnosis    1980's  . Colon cancer ([place]) 2009   colon  . GERD (gastroesophageal reflux disease)   . Hiatal hernia   . Hypertension     Past Surgical History:  Procedure Laterality Date  . [place]  . CHOLECYSTECTOMY  1980  . COLONOSCOPY    . COLONOSCOPY WITH PROPOFOL N/A 10/02/2015   Procedure: COLONOSCOPY WITH PROPOFOL;  Surgeon: Ladene Artist, MD;  Location: [facility];  Service: Endoscopy;  Laterality: N/A;  . PARTIAL COLECTOMY  2009  . POLYPECTOMY    . TUBAL LIGATION  1989    There were no vitals filed for this visit.      Subjective Assessment - 08/03/17 0806    Subjective Pt reports she missed last week's PT session because she fell in the bathtub and hurt her knee.   Patient Stated Goals "Be able to walk longer distances and stand for longer periods."   Currently in Pain? Yes   Pain Score 0-No pain   Pain Location Buttocks   Pain Score 4   Pain Location Knee   Pain Orientation Left   Pain Descriptors / Indicators Sore   Pain Type Acute pain;Chronic pain   Pain  Onset 1 to 4 weeks ago   Pain Frequency Intermittent   Aggravating Factors  fall in bathtub                         [facility] Adult PT Treatment/Exercise - 08/03/17 0804      Self-Care   Self-Care Posture   Posture Reviewed posture & body mechanics handout, emphasizing log rolling and sidelying to sit, worsk station set-up and good body mechanics for daily chores/tasks     Lumbar Exercises: Stretches   Single Knee to Chest Stretch 30 seconds;2 reps   Single Knee to Chest Stretch Limitations supine with strap   Double Knee to Chest Stretch 30 seconds;2 reps   Double Knee to Chest Stretch Limitations heels on green Pball   ITB Stretch 30 seconds;2 reps   ITB Stretch Limitations supine with strap   Piriformis Stretch 30 seconds;2 reps   Piriformis Stretch Limitations seated with LE ER on edge of seat and leaning forward     Lumbar Exercises: Seated   Long Arc Quad on [place] Both;15 reps   LAQ on [place] Limitations green 65 cm Pball   Hip Flexion on Ball Both;10 reps  2  sets   Hip Flexion on Ball Limitations 1st set marching, 2nd set + opp UE flexion; 65 cm Pball   Other Seated Lumbar Exercises seated row & shoulder extension with blue TB x15, B pallof press with single blue green TB x15; seated on green 65cm Pball     Knee/Hip Exercises: Aerobic   Nustep lvl 6 x 5' (B UE/LE)                  PT Short Term Goals - 06/04/17 0813      PT SHORT TERM GOAL #1   Title Independent with initial HEP by 06/05/17   Status Achieved           PT Long Term Goals - 07/02/17 0817      PT LONG TERM GOAL #1   Title Independent with ongoing HEP    Status Partially Met   Target Date 08/14/17     PT LONG TERM GOAL #2   Title B hip strength >/= 4/5 for improved proximal stability    Status Partially Met  met except hip extension & ER   Target Date 08/14/17     PT LONG TERM GOAL #3   Title Pt will report ability to stand for >/= 30 minutes w/o limitation due to LBP or  R hip pain    Status On-going   Target Date 08/14/17     PT LONG TERM GOAL #4   Title Pt will report ability to walk for >/= 20-30 minutes w/o limitation due to pain or weakness to allow pt to complete grocery shopping    Status Partially Met   Target Date 08/14/17               Plan - 08/03/17 0810    Clinical Impression Statement Pt reporting back and buttock pain has been pretty well controlled recently, but L knee exacerbated by recent fall in bathtub causing her to miss last week's PT session. Pt states she feels like she will be ready to transition to HEP as of next week as originally anticipated, therefore reviewed HEP and clarified proper technique with posture and body mechanics in prep for upcoming discharge.   Rehab Potential Good   Clinical Impairments Affecting Rehab Potential obesity, bilateral knee OA, HTN   PT Frequency 1x / week   PT Duration 6 weeks   PT Treatment/Interventions Patient/family education;ADLs/Self Care Home Management;Therapeutic exercise;Manual techniques;Dry needling;Taping;Electrical Stimulation;Moist Heat;Iontophoresis 40m/ml Dexamethasone;Gait training;Stair training;Neuromuscular re-education   PT Next Visit Plan proximal flexibility; LE/core strengthening; manual therapy to address increased muscle tension; modalities PRN   Consulted and Agree with Plan of Care Patient      Patient will benefit from skilled therapeutic intervention in order to improve the following deficits and impairments:  Pain, Impaired flexibility, Decreased strength, Increased muscle spasms, Difficulty walking, Decreased activity tolerance, Abnormal gait  Visit Diagnosis: Pain in right hip  Acute right-sided low back pain, with sciatica presence unspecified  Muscle weakness (generalized)  Difficulty in walking, not elsewhere classified     Problem List Patient Active Problem List   Diagnosis Date Noted  . Trochanteric bursitis of right hip 05/14/2017  .  [place] 08/01/2016  . IFG (impaired fasting glucose) 07/14/2016  . Primary osteoarthritis of both hands 08/03/2015  . Elevated LDL cholesterol level 09/16/2013  . Trigger thumb of left hand 09/16/2013  . Knee osteoarthritis 09/16/2013  . HTN (hypertension), benign 09/16/2013  . IDA (iron deficiency anemia) 09/16/2013  . Asthma 09/16/2013  .  Unspecified vitamin D deficiency 09/16/2013  . 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    Percival Spanish, PT, MPT 08/03/2017, 8:52 AM  [facility] [address]  [place], [place], [address] Phone: [phone]   Fax:  [phone]  Name: COPPER KIRTLEY MRN: [id number] Date of Birth: 1956/06/13

## 2017-08-04 ENCOUNTER — Telehealth: Payer: Self-pay

## 2017-08-04 NOTE — Telephone Encounter (Signed)
-----   Message from Lavonia Drafts, MD sent at 08/02/2017  5:19 PM EDT ----- Please call pt. Her ovaries are not seen on the [place]. NO f/u needed.  Thx, clh-S

## 2017-08-04 NOTE — Telephone Encounter (Signed)
Left message for patient to return call to office to give her ultrasound results. Kathrene Alu RNBSN

## 2017-08-05 ENCOUNTER — Encounter: Payer: Self-pay | Admitting: Family Medicine

## 2017-08-05 ENCOUNTER — Telehealth: Payer: Self-pay | Admitting: Family Medicine

## 2017-08-05 ENCOUNTER — Ambulatory Visit (INDEPENDENT_AMBULATORY_CARE_PROVIDER_SITE_OTHER): Payer: 59 | Admitting: Family Medicine

## 2017-08-05 VITALS — BP 136/88 | HR 99 | Ht 60.0 in | Wt 315.0 lb

## 2017-08-05 DIAGNOSIS — R252 Cramp and spasm: Secondary | ICD-10-CM

## 2017-08-05 DIAGNOSIS — M7061 Trochanteric bursitis, right hip: Secondary | ICD-10-CM

## 2017-08-05 DIAGNOSIS — M170 Bilateral primary osteoarthritis of knee: Secondary | ICD-10-CM

## 2017-08-05 DIAGNOSIS — Z23 Encounter for immunization: Secondary | ICD-10-CM | POA: Diagnosis not present

## 2017-08-05 NOTE — Telephone Encounter (Signed)
-----   Message from Gregor Hams, MD sent at 08/05/2017  7:51 AM EDT ----- Regarding: Monovisc We tried to prior auth Ms Dimaio's orthovisc in July and I think we may have not included both Tricare and her [place] [employer].  Can we try again for either Monovisc or Synvisc1?  Thanks, Ellard Artis

## 2017-08-05 NOTE — Progress Notes (Signed)
[name] is a 61 y.o. female who presents to [facility] today for hip pain. Patient is been seen previously for trochanteric bursitis. She's been attending physical therapy and has had significant improvement. She notes that she is almost 100% better. She does to continue to experience some mild pain here and there. Overall she is satisfied with how things are going.  Additionally she has been seen for hand cramping previously. She had a workup at the last visit that did not show much. She notes with hand activity especially beyond a moderate amount she experiences painful cramps. She notes the symptoms are mildly obnoxious but not especially bothersome. She does not wish to pursue further workup at this time.  Knee arthritis: Patient is a history of bilateral knee arthritis. She is interested in Monovisc or Synvisc 1 injections if possible.    Past Medical History:  Diagnosis Date  . Allergy    seasonal  . Anemia   . Arthritis   . Asthma   . 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 Blood transfusion without reported diagnosis    1980's  . Colon cancer ([place]) 2009   colon  . GERD (gastroesophageal reflux disease)   . Hiatal hernia   . Hypertension    Past Surgical History:  Procedure Laterality Date  . [place]  . CHOLECYSTECTOMY  1980  . COLONOSCOPY    . COLONOSCOPY WITH PROPOFOL N/A 10/02/2015   Procedure: COLONOSCOPY WITH PROPOFOL;  Surgeon: Ladene Artist, MD;  [facility];  Service: Endoscopy;  Laterality: N/A;  . PARTIAL COLECTOMY  2009  . POLYPECTOMY    . TUBAL LIGATION  1989   Social History  Substance Use Topics  . Smoking status: Never Smoker  . Smokeless tobacco: Never Used  . Alcohol use No     ROS:  As above   Medications: Current Outpatient Prescriptions  Medication Sig Dispense Refill  . albuterol (PROVENTIL HFA;VENTOLIN HFA) 108 (90 Base) MCG/ACT inhaler  Inhale 2 puffs into the lungs every 6 (six) hours as needed for wheezing or shortness of breath. 1 Inhaler 6  . albuterol (PROVENTIL) (2.5 MG/3ML) 0.083% nebulizer solution Take 3 mLs (2.5 mg total) by nebulization every 4 (four) hours as needed for wheezing or shortness of breath (please include nebulizer machine, hoses, and mask if needed.). 30 vial 6  . celecoxib (CELEBREX) 200 MG capsule Take 1 capsule (200 mg total) by mouth 2 (two) times daily. 180 capsule 1  . cetirizine (ZYRTEC) 10 MG tablet Take 10 mg by mouth daily.     . Cholecalciferol (VITAMIN D) 2000 UNITS CAPS Take 2,000 Units by mouth daily.    . fluticasone (FLONASE) 50 MCG/ACT nasal spray Place 2 sprays into both nostrils as needed for allergies or rhinitis. 16 g 6  . Fluticasone-Salmeterol (ADVAIR DISKUS) 250-50 MCG/DOSE AEPB Inhale 1 puff into the lungs 2 (two) times daily. 1 each 11  . glucosamine-chondroitin 500-400 MG tablet Take 1 tablet by mouth daily.     . hydrochlorothiazide (HYDRODIURIL) 25 MG tablet Take 1 tablet (25 mg total) by mouth daily. 90 tablet 3  . metFORMIN (GLUCOPHAGE-XR) 500 MG 24 hr tablet Take 1 tablet (500 mg total) by mouth daily with breakfast. 30 tablet 5  . tiZANidine (ZANAFLEX) 4 MG tablet Take 1 tablet (4 mg total) by mouth at bedtime as needed for muscle spasms. OK to substitute caps if  cheaper. 30 tablet 0  . traMADol (ULTRAM) 50 MG tablet Take 1 tablet (50 mg total) by mouth every 6 (six) hours as needed. Takes 1/2 tablet (Patient taking differently: Take 25 mg by mouth every 6 (six) hours as needed. ) 120 tablet 1   No current facility-administered medications for this visit.    Allergies  Allergen Reactions  . Augmentin [Amoxicillin-Pot Clavulanate] Diarrhea  . Phentermine     Numbness tingling/feels weird.      Exam:  BP 136/88   Pulse 99   Ht 5' (1.524 m)   Wt (!) 315 lb (142.9 kg)   BMI 61.52 kg/m  General: Well Developed, well nourished, and in no acute distress.  Neuro/Psych:  Alert and oriented x3, extra-ocular muscles intact, able to move all 4 extremities, sensation grossly intact. Skin: Warm and dry, no rashes noted.  Respiratory: Not using accessory muscles, speaking in full sentences, trachea midline.  Cardiovascular: Pulses palpable, no extremity edema. Abdomen: Does not appear distended. MSK: Gait is normal. No significant antalgia.   Hands with normal sensation and strength. Normal motion.    Chemistry      Component Value Date/Time   NA 139 06/26/2017 0902   NA 142 04/08/2013   K 3.9 06/26/2017 0902   CL 101 06/26/2017 0902   CL 102 04/08/2013   CO2 24 06/26/2017 0902   BUN 14 06/26/2017 0902   BUN 7 04/08/2013   CREATININE 0.92 06/26/2017 0902   GLU 100 04/08/2013      Component Value Date/Time   CALCIUM 9.0 06/26/2017 0902   CALCIUM 8.9 04/08/2013   ALKPHOS 98 06/26/2017 0902   AST 13 06/26/2017 0902   ALT 13 06/26/2017 0902   BILITOT 0.6 06/26/2017 0902     Lab Results  Component Value Date   TSH 1.02 06/26/2017   Lab Results  Component Value Date   CKTOTAL 80 06/26/2017      No results found for this or any previous visit (from the past 48 hour(s)). No results found.    Assessment and Plan: 61 y.o. female with  Trochanteric bursitis. Significantly improved continue home physical therapy activities. Recheck as needed.  Hand cramping: Unclear etiology. Likely not metabolic. Likely neurogenic based on distribution of cramping. Symptoms are bothersome enough for proceed with nerve conduction study.  Knee DJD. We'll pursue prior authorization for Hyaluronic acid injections.   Influenza vaccine given today.   No orders of the defined types were placed in this encounter.  No orders of the defined types were placed in this encounter.   Discussed warning signs or symptoms. Please see discharge instructions. Patient expresses understanding.   I spent 25 minutes with this patient, greater than 50% was face-to-face time  counseling regarding ddx and treatment plan. Marland Kitchen

## 2017-08-05 NOTE — Telephone Encounter (Signed)
Spoke with [facility] benefits crew yesterday, they are working on this.

## 2017-08-05 NOTE — Patient Instructions (Addendum)
Thank you for coming in today. Continue either formal PT or home exercises.  Recheck in 6 weeks as needed.  We will continue to follow the hand cramping.  If bad enough let me know and we will do a Nerve Conduction Study.  If you want more formal PT let me know.   We will look again about the Hyaluronic acid injections.  We will run both [place] and [place].

## 2017-08-10 ENCOUNTER — Ambulatory Visit: Payer: 59 | Admitting: Physical Therapy

## 2017-08-10 DIAGNOSIS — M25551 Pain in right hip: Secondary | ICD-10-CM | POA: Diagnosis not present

## 2017-08-10 DIAGNOSIS — R262 Difficulty in walking, not elsewhere classified: Secondary | ICD-10-CM

## 2017-08-10 DIAGNOSIS — M545 Low back pain: Secondary | ICD-10-CM

## 2017-08-10 DIAGNOSIS — M6281 Muscle weakness (generalized): Secondary | ICD-10-CM

## 2017-08-10 NOTE — Therapy (Signed)
[facility] [address]  [place], [place], [address] Phone: [phone]   Fax:  [phone]  Physical Therapy Treatment  Patient Details  Name: [name] MRN: [id number] Date of Birth: [birth year] Referring Provider: Gregor Hams, MD  Encounter Date: 08/10/2017      PT End of Session - 08/10/17 0808    Visit Number 17   Number of Visits 18   Date for PT Re-Evaluation 08/14/17   Authorization Type UHC & Tricare   PT Start Time 0808  pt arrived late   PT Stop Time 0847   PT Time Calculation (min) 39 min   Activity Tolerance Patient tolerated treatment well   Behavior During Therapy [facility] for tasks assessed/performed      Past Medical History:  Diagnosis Date  . Allergy    seasonal  . Anemia   . Arthritis   . Asthma   . 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 Blood transfusion without reported diagnosis    1980's  . Colon cancer ([place]) 2009   colon  . GERD (gastroesophageal reflux disease)   . Hiatal hernia   . Hypertension     Past Surgical History:  Procedure Laterality Date  . [place]  . CHOLECYSTECTOMY  1980  . COLONOSCOPY    . COLONOSCOPY WITH PROPOFOL N/A 10/02/2015   Procedure: COLONOSCOPY WITH PROPOFOL;  Surgeon: Ladene Artist, MD;  [facility];  Service: Endoscopy;  Laterality: N/A;  . PARTIAL COLECTOMY  2009  . POLYPECTOMY    . TUBAL LIGATION  1989    There were no vitals filed for this visit.      Subjective Assessment - 08/10/17 0810    Subjective Pt reporting she saw the MD last week and he has instructed her to continue with her HEP after today.   How long can you stand comfortably? >30 minutes   How long can you walk comfortably? 2 hrs with occasional seated rest break   Patient Stated Goals "Be able to walk longer distances and stand for longer periods."   Currently in Pain? No/denies   Pain Onset 1 to 4 weeks ago             [facility] PT Assessment - 08/10/17 0808      Assessment   Medical Diagnosis R hip trochanteric bursitis & LBP   Referring Provider Gregor Hams, MD   Next MD Visit as needed     Observation/Other Assessments   Focus on Therapeutic Outcomes (FOTO)  Hip 87% (13% limitation)     Strength   Right Hip Flexion 4+/5   Right Hip Extension 4/5   Right Hip External Rotation  4/5   Right Hip Internal Rotation 4+/5   Right Hip ABduction 4+/5   Right Hip ADduction 4/5   Left Hip Flexion 4+/5   Left Hip Extension 4/5   Left Hip External Rotation 4/5   Left Hip Internal Rotation 4+/5   Left Hip ABduction 4+/5   Left Hip ADduction 4/5                     [facility] Adult PT Treatment/Exercise - 08/10/17 0808      Knee/Hip Exercises: Aerobic   Nustep lvl 6 x 6' (B UE/LE)                PT Education - 08/10/17 5102  Education provided Yes   Education Details Pt issued home TENS/IT unit and provided instruction in precautions, set-up and use of device.   Person(s) Educated Patient   Methods Explanation;Demonstration;Handout   Comprehension Verbalized understanding          PT Short Term Goals - 06/04/17 0813      PT SHORT TERM GOAL #1   Title Independent with initial HEP by 06/05/17   Status Achieved           PT Long Term Goals - 08/21/17 0814      PT LONG TERM GOAL #1   Title Independent with ongoing HEP    Status Achieved     PT LONG TERM GOAL #2   Title B hip strength >/= 4/5 for improved proximal stability    Status Achieved     PT LONG TERM GOAL #3   Title Pt will report ability to stand for >/= 30 minutes w/o limitation due to LBP or R hip pain    Status Achieved     PT LONG TERM GOAL #4   Title Pt will report ability to walk for >/= 20-30 minutes w/o limitation due to pain or weakness to allow pt to complete grocery shopping    Status Achieved               Plan - 08/21/17 0814    Clinical Impression Statement Pt  reports she was released by MD for her back/hip and will complete PT today. Pt reporting significant improvement in low back, buttock and radicular pain start of PT, with no pain noted today. Proximal LE strength improved with overall strength now 4/5 to 4+/5. Pt reporting significant improvements in standing and walking tolerance, with remaining limitations due to fatigue rather than pain. Pt is independent with ongoing HEP and understands need to continue to with HEP to prevent recurrence of problems. All goals met, therefore will proceed with discharge from PT at this time.   Rehab Potential Good   Clinical Impairments Affecting Rehab Potential obesity, bilateral knee OA, HTN   PT Frequency 1x / week   PT Duration 6 weeks   PT Treatment/Interventions Patient/family education;ADLs/Self Care Home Management;Therapeutic exercise;Manual techniques;Dry needling;Taping;Electrical Stimulation;Moist Heat;Iontophoresis 51m/ml Dexamethasone;Gait training;Stair training;Neuromuscular re-education   Consulted and Agree with Plan of Care Patient      Patient will benefit from skilled therapeutic intervention in order to improve the following deficits and impairments:  Pain, Impaired flexibility, Decreased strength, Increased muscle spasms, Difficulty walking, Decreased activity tolerance, Abnormal gait  Visit Diagnosis: Pain in right hip  Acute right-sided low back pain, with sciatica presence unspecified  Muscle weakness (generalized)  Difficulty in walking, not elsewhere classified       G-Codes - 1Oct 19, [id number]    Functional Assessment Tool Used (Outpatient Only) Hip FOTO = 87% (13% limitation)   Functional Limitation Mobility: Walking and moving around   Mobility: Walking and Moving Around Goal Status ([phone] At least 40 percent but less than 60 percent impaired, limited or restricted   Mobility: Walking and Moving Around Discharge Status ([phone] At least 1 percent but less than 20 percent  impaired, limited or restricted      Problem List Patient Active Problem List   Diagnosis Date Noted  . Trochanteric bursitis of right hip 05/14/2017  . Morbid obesity ([place] 08/01/2016  . IFG (impaired fasting glucose) 07/14/2016  . Primary osteoarthritis of both hands 08/03/2015  . Elevated LDL cholesterol level 09/16/2013  . Trigger thumb of left  hand 09/16/2013  . Knee osteoarthritis 09/16/2013  . HTN (hypertension), benign 09/16/2013  . IDA (iron deficiency anemia) 09/16/2013  . Asthma 09/16/2013  . Unspecified vitamin D deficiency 09/16/2013  . 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    PHYSICAL THERAPY DISCHARGE SUMMARY  Visits from Start of Care: 17  Current functional level related to goals / functional outcomes:   Refer to above clinical impression.   Remaining deficits:   As above.   Education / Equipment:   HEP, Training and development officer, home TENS/IT unit  Plan: Patient agrees to discharge.  Patient goals were partially met. Patient is being discharged due to meeting the stated rehab goals.  ?????      Percival Spanish, PT, MPT 08/10/2017, 12:17 PM  [facility] [address]  [place], [place], [address] Phone: [phone]   Fax:  [phone]  Name: [name] MRN: [id number] Date of Birth: 06-22-1956

## 2017-08-10 NOTE — Patient Instructions (Signed)
TENS stands for Transcutaneous Electrical Nerve Stimulation. In other words, electrical impulses are allowed to pass through the skin in order to excite a nerve.   Purpose and Use of TENS:  TENS is a method used to manage acute and chronic pain without the use of drugs. It has been effective in managing pain associated with surgery, sprains, strains, trauma, rheumatoid arthritis, and neuralgias. It is a non-addictive, low risk, and non-invasive technique used to control pain. It is not, by any means, a curative form of treatment.   How TENS Works:  Most TENS units are a small pocket-sized unit powered by one 9 volt battery. Attached to the outside of the unit are two lead wires where two pins and/or snaps connect on each wire. All units come with a set of four reusable pads or electrodes. These are placed on the skin surrounding the area involved. By inserting the leads into  the pads, the electricity can pass from the unit making the circuit complete.  As the intensity is turned up slowly, the electrical current enters the body from the electrodes through the skin to the surrounding nerve fibers. This triggers the release of hormones from within the body. These hormones contain pain relievers. By increasing the circulation of these hormones, the person's pain may be lessened. It is also believed that the electrical stimulation itself helps to block the pain messages being sent to the brain, thus also decreasing the body's perception of pain.   Hazards:  TENS units are NOT to be used by patients with PACEMAKERS, DEFIBRILLATORS, DIABETIC PUMPS, PREGNANT WOMEN, and patients with SEIZURE DISORDERS.  TENS units are NOT to be used over the heart, throat, brain, or spinal cord.  One of the major side effects from the TENS unit may be skin irritation. Some people may develop a rash if they are sensitive to the materials used in the electrodes or the connecting wires.   Wear the unit for up to 30 minutes at a  time.   Avoid overuse due the body getting used to the stem making it not as effective over time.    

## 2017-08-12 NOTE — Telephone Encounter (Signed)
Sending PA form to [place].

## 2017-09-15 ENCOUNTER — Telehealth: Payer: Self-pay | Admitting: Family Medicine

## 2017-09-15 MED ORDER — CELECOXIB 200 MG PO CAPS: 200.0000 mg | ORAL_CAPSULE | Freq: Two times a day (BID) | ORAL | 1 refills | Status: DC

## 2017-09-15 MED ORDER — HYDROCHLOROTHIAZIDE 25 MG PO TABS: 25.0000 mg | ORAL_TABLET | Freq: Every day | ORAL | 3 refills | Status: DC

## 2017-09-15 NOTE — Telephone Encounter (Signed)
Patient called scheduled a physical for the next avail early morning 09/29/17 but she needs refill for Celebrex and Hydrocholorothiazide. Pt uses pharmacy at [facility] and needs a written script so she can take to have filled and she is going this wknd and request to get it this week to take with her. Thanks

## 2017-09-15 NOTE — Telephone Encounter (Signed)
Called and informed pt .[name] Lynetta  

## 2017-09-18 ENCOUNTER — Telehealth: Payer: Self-pay | Admitting: Family Medicine

## 2017-09-18 NOTE — Telephone Encounter (Signed)
11/16 @ 335 spouse Jeneen Rinks) picked up script.

## 2017-09-29 ENCOUNTER — Encounter: Payer: Self-pay | Admitting: Family Medicine

## 2017-09-29 ENCOUNTER — Ambulatory Visit (INDEPENDENT_AMBULATORY_CARE_PROVIDER_SITE_OTHER): Payer: 59 | Admitting: Family Medicine

## 2017-09-29 VITALS — BP 116/54 | HR 97 | Ht 59.0 in | Wt 313.0 lb

## 2017-09-29 DIAGNOSIS — Z0000 Encounter for general adult medical examination without abnormal findings: Secondary | ICD-10-CM | POA: Diagnosis not present

## 2017-09-29 DIAGNOSIS — R7301 Impaired fasting glucose: Secondary | ICD-10-CM

## 2017-09-29 DIAGNOSIS — E559 Vitamin D deficiency, unspecified: Secondary | ICD-10-CM | POA: Diagnosis not present

## 2017-09-29 DIAGNOSIS — J4540 Moderate persistent asthma, uncomplicated: Secondary | ICD-10-CM

## 2017-09-29 MED ORDER — METFORMIN HCL ER 500 MG PO TB24: 500.0000 mg | ORAL_TABLET | Freq: Every day | ORAL | 3 refills | Status: DC

## 2017-09-29 MED ORDER — FLUTICASONE PROPIONATE 50 MCG/ACT NA SUSP
2.0000 | NASAL | 6 refills | Status: DC | PRN
Start: 2017-09-29 — End: 2018-09-06

## 2017-09-29 MED ORDER — FLUTICASONE-SALMETEROL 250-50 MCG/DOSE IN AEPB: 1.0000 | INHALATION_SPRAY | Freq: Two times a day (BID) | RESPIRATORY_TRACT | 11 refills | Status: DC

## 2017-09-29 MED ORDER — ALBUTEROL SULFATE HFA 108 (90 BASE) MCG/ACT IN AERS: 2.0000 | INHALATION_SPRAY | Freq: Four times a day (QID) | RESPIRATORY_TRACT | 6 refills | Status: DC | PRN

## 2017-09-29 MED ORDER — ALBUTEROL SULFATE (2.5 MG/3ML) 0.083% IN NEBU: 2.5000 mg | INHALATION_SOLUTION | RESPIRATORY_TRACT | 6 refills | Status: DC | PRN

## 2017-09-29 NOTE — Progress Notes (Signed)
Subjective:     [name] is a 61 y.o. female and is here for a comprehensive physical exam. The patient reports no problems.  Social History   Socioeconomic History  . Marital status: Married    Spouse name: Not on file  . Number of children: Not on file  . Years of education: Not on file  . Highest education level: Not on file  Social Needs  . Financial resource strain: Not on file  . Food insecurity - worry: Not on file  . Food insecurity - inability: Not on file  . Transportation needs - medical: Not on file  . Transportation needs - non-medical: Not on file  Occupational History  . Not on file  Tobacco Use  . Smoking status: Never Smoker  . Smokeless tobacco: Never Used  Substance and Sexual Activity  . Alcohol use: No    Alcohol/week: 0.0 oz  . Drug use: No  . Sexual activity: Yes  Other Topics Concern  . Not on file  Social History Narrative  . Not on file   Health Maintenance  Topic Date Due  . COLONOSCOPY  10/01/2018  . MAMMOGRAM  10/18/2018  . PAP SMEAR  08/01/2021  . TETANUS/TDAP  08/02/2024  . INFLUENZA VACCINE  Completed  . Hepatitis C Screening  Completed  . HIV Screening  Completed    The following portions of the patient's history were reviewed and updated as appropriate: allergies, current medications, past family history, past medical history, past social history, past surgical history and problem list.  Review of Systems A comprehensive review of systems was negative.   Objective:    BP (!) 116/54   Pulse 97   Ht 4\' 11"  (1.499 m)   Wt (!) 313 lb (142 kg)   SpO2 98%   BMI 63.22 kg/m  General appearance: alert, cooperative, appears stated age and obese Head: Normocephalic, without obvious abnormality, atraumatic Eyes: conj clear, EOMI, PEERLA Ears: normal TM's and external ear canals both ears Nose: Nares normal. Septum midline. Mucosa normal. No drainage or sinus tenderness. Throat: lips, mucosa, and tongue normal; teeth and gums  normal Neck: no adenopathy, no carotid bruit, no JVD, supple, symmetrical, trachea midline and thyroid not enlarged, symmetric, no tenderness/mass/nodules Back: symmetric, no curvature. ROM normal. No CVA tenderness. Lungs: clear to auscultation bilaterally Breasts: normal appearance, no masses or tenderness Heart: regular rate and rhythm, S1, S2 normal, no murmur, click, rub or gallop Abdomen: soft, non-tender; bowel sounds normal; no masses,  no organomegaly Extremities: extremities normal, atraumatic, no cyanosis or edema Pulses: 2+ and symmetric Skin: Skin color, texture, turgor normal. No rashes or lesions Lymph nodes: Cervical, supraclavicular, and axillary nodes normal. Neurologic: Alert and oriented X 3, normal strength and tone. Normal symmetric reflexes. Normal coordination and gait    Assessment:    Healthy female exam.      Plan:     See After Visit Summary for Counseling Recommendations   Keep up a regular exercise program and make sure you are eating a healthy diet Try to eat 4 servings of dairy a day, or if you are lactose intolerant take a calcium with vitamin D daily.  Your vaccines are up to date.   Asthma - refilled medications  IFG - due for A1C.

## 2017-09-29 NOTE — Patient Instructions (Addendum)

## 2017-10-01 LAB — LIPID PANEL
CHOLESTEROL: 209 mg/dL — AB (ref <200)
HDL: 45 mg/dL — ABNORMAL LOW (ref >50)
LDL Cholesterol (Calc): 138 mg/dL (calc) — ABNORMAL HIGH
Non-HDL Cholesterol (Calc): 164 mg/dL (calc) — ABNORMAL HIGH (ref <130)
TRIGLYCERIDES: 131 mg/dL (ref <150)
Total CHOL/HDL Ratio: 4.6 (calc) (ref <5.0)

## 2017-10-01 LAB — COMPLETE METABOLIC PANEL WITH GFR
AG RATIO: 1.2 (calc) (ref 1.0–2.5)
ALBUMIN MSPROF: 3.7 g/dL (ref 3.6–5.1)
ALKALINE PHOSPHATASE (APISO): 106 U/L (ref 33–130)
ALT: 11 U/L (ref 6–29)
AST: 13 U/L (ref 10–35)
BILIRUBIN TOTAL: 0.8 mg/dL (ref 0.2–1.2)
BUN: 11 mg/dL (ref 7–25)
CHLORIDE: 101 mmol/L (ref 98–110)
CO2: 31 mmol/L (ref 20–32)
Calcium: 9.1 mg/dL (ref 8.6–10.4)
Creat: 0.99 mg/dL (ref 0.50–0.99)
GFR, Est African American: 72 mL/min/{1.73_m2} (ref >=60)
GFR, Est Non African American: 62 mL/min/{1.73_m2} (ref >=60)
GLUCOSE: 95 mg/dL (ref 65–99)
Globulin: 3.1 g/dL (calc) (ref 1.9–3.7)
POTASSIUM: 3.7 mmol/L (ref 3.5–5.3)
SODIUM: 140 mmol/L (ref 135–146)
Total Protein: 6.8 g/dL (ref 6.1–8.1)

## 2017-10-01 LAB — HEMOGLOBIN A1C
EAG (MMOL/L): 6.8 (calc)
HEMOGLOBIN A1C: 5.9 %{Hb} — AB (ref <5.7)
Mean Plasma Glucose: 123 (calc)

## 2017-10-01 LAB — VITAMIN D 25 HYDROXY (VIT D DEFICIENCY, FRACTURES): VIT D 25 HYDROXY: 36 ng/mL (ref 30–100)

## 2017-11-28 IMAGING — US US PELVIS COMPLETE
1 series · 14 of 25 positions shown · non-contrast
Comparison: No recent prior .

CLINICAL DATA: Postmenopausal bleeding.

EXAM:
TRANSABDOMINAL AND TRANSVAGINAL ULTRASOU complex cysts. ND OF PELVIS
TECHNIQUE: Both transabdominal and transvaginal ultrasound examinations of the
pelvis were performed. Transabdominal technique was performed for
global imaging of the pelvis including uterus, ovaries, adnexal
regions, and pelvic cul-de-sac. It was necessary to proceed with
endovaginal exam following the transabdominal exam to visualize the
uterus and ovaries.

[Series 1: us pelvis complete · 0.20mm/px · 14 of 59 slices shown]
[im 1/59]
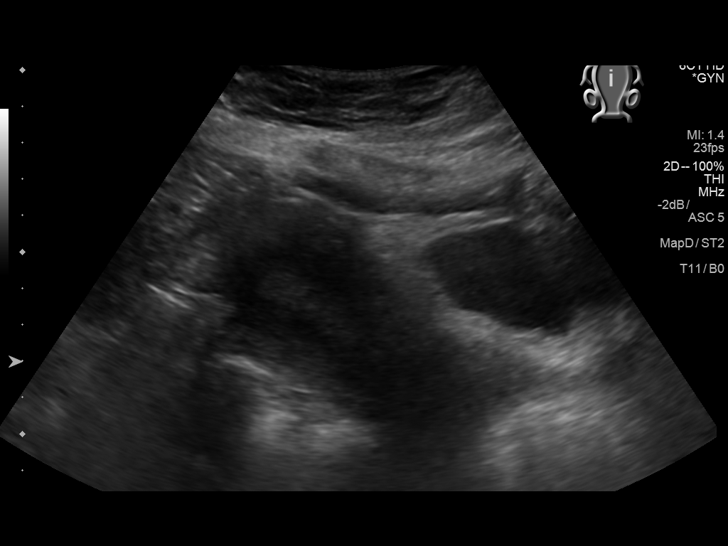
[im 5/59]
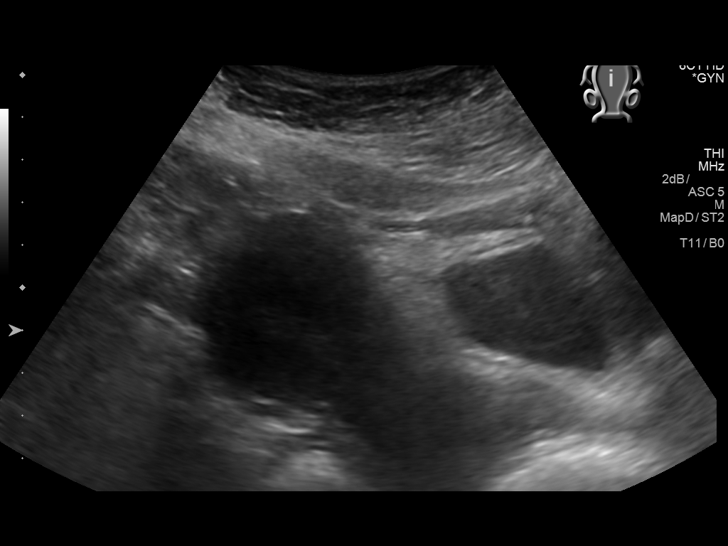
[im 10/59]
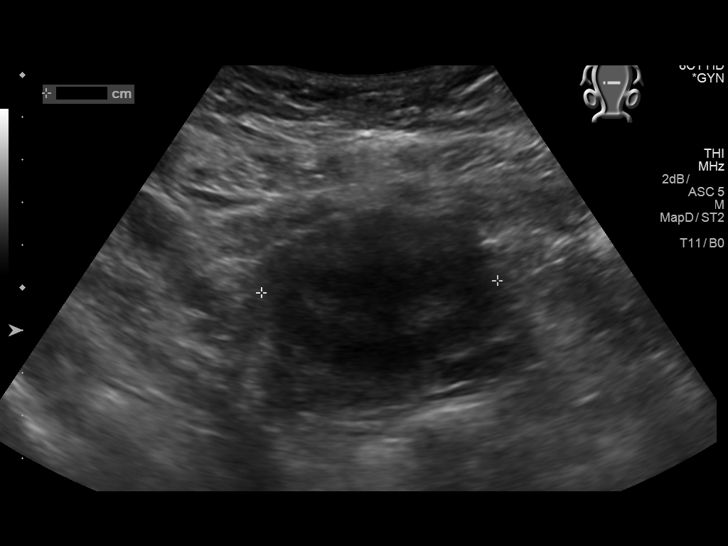
[im 15/59]
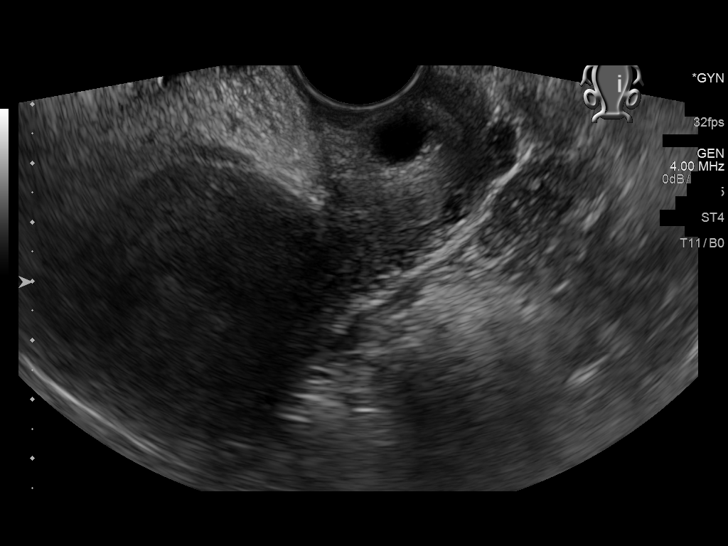
[im 20/59]
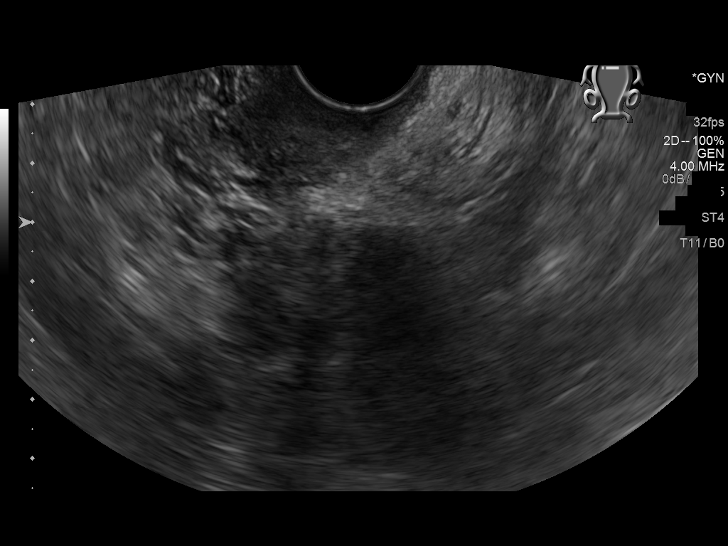
[im 22/59]
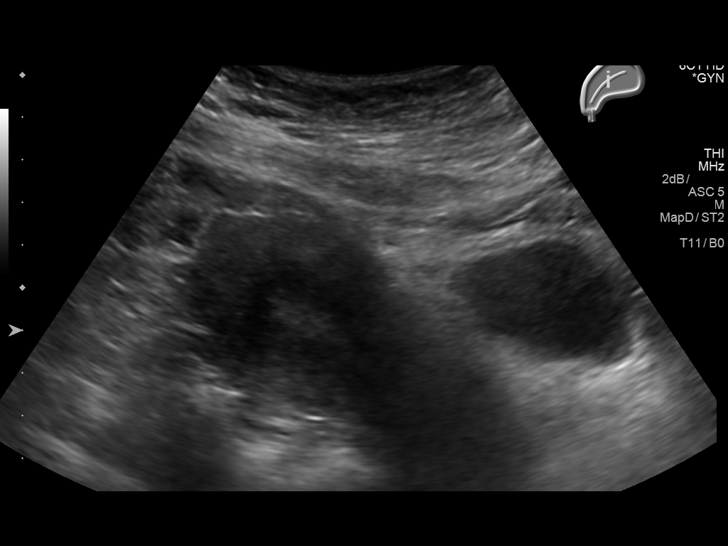
[im 27/59]
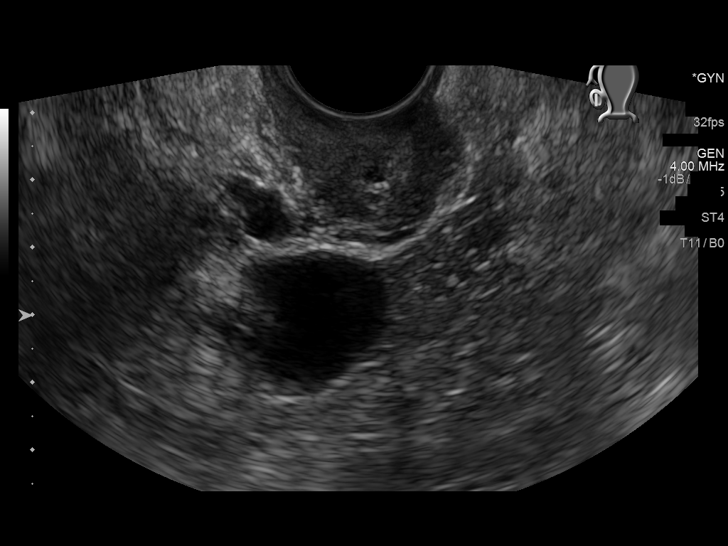
[im 32/59]
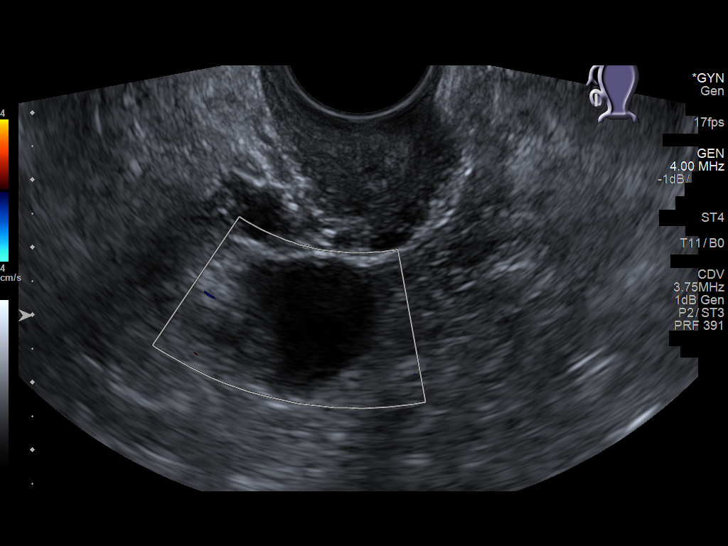
[im 37/59]
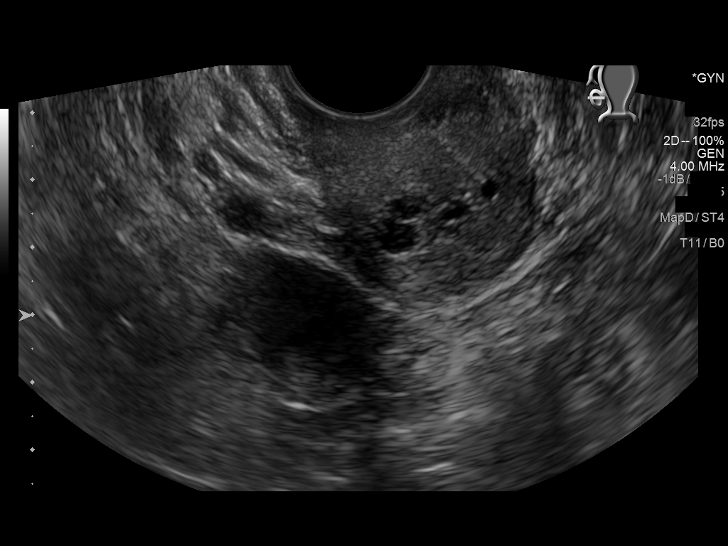
[im 39/59]
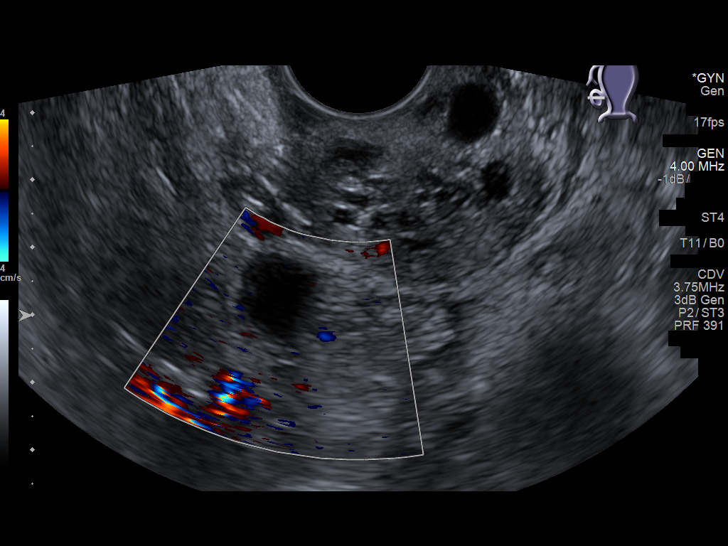
[im 44/59]
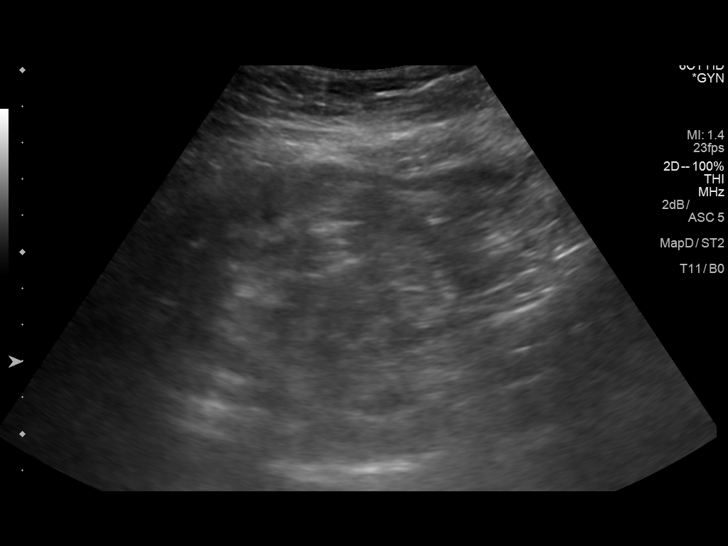
[im 49/59]
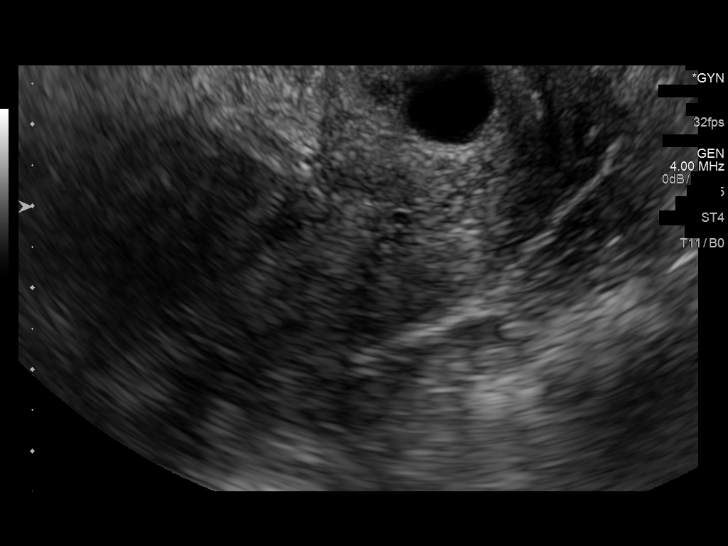
[im 54/59]
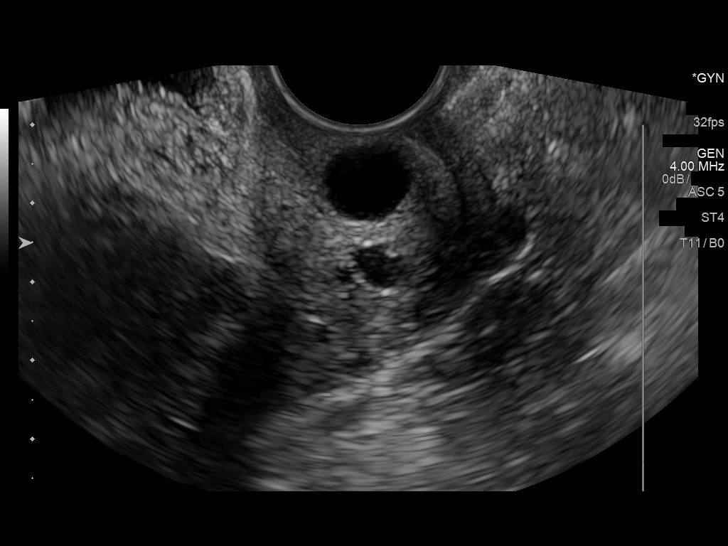
[im 59/59]
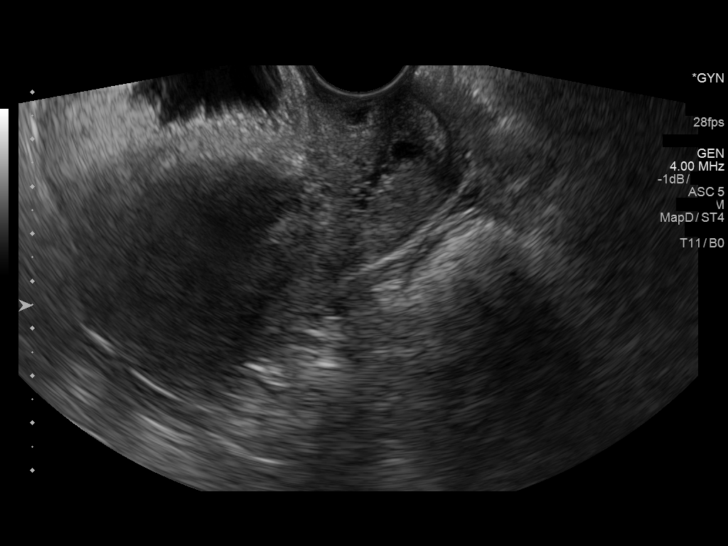

[14 of 25 positions shown; findings below may reference images not displayed]

FINDINGS: Uterus

Measurements: 8.5 x 4.2 x 5.6 cm. No fibroids noted. Multiple cysts
in the region of the cervix most likely nabothian cysts.

Endometrium

Thickness: 10.6 cm.  No focal abnormality visualized.

Right ovary

Measurements: 3.0 x 1.9 x 3.0 cm. 2.0 x 1.8 x 2.0 complex cyst.

Left ovary

Not visualized.  Normal appearance/no adnexal mass.

Other findings

No abnormal free fluid.
IMPRESSION: 1. 2.0 cm complex cyst right ovary. Cystic ovarian tumor cannot be
completely excluded. Gynecologic evaluation suggested.

2. Left ovary not visualized. Multiple small cysts in the cervix
suggesting nabothian cysts.

## 2017-12-04 ENCOUNTER — Ambulatory Visit: Payer: 59 | Admitting: Family Medicine

## 2018-03-22 ENCOUNTER — Encounter: Payer: Self-pay | Admitting: Family Medicine

## 2018-03-22 ENCOUNTER — Ambulatory Visit (INDEPENDENT_AMBULATORY_CARE_PROVIDER_SITE_OTHER): Payer: 59 | Admitting: Family Medicine

## 2018-03-22 VITALS — BP 121/73 | HR 91 | Ht 59.0 in | Wt 315.0 lb

## 2018-03-22 DIAGNOSIS — J4540 Moderate persistent asthma, uncomplicated: Secondary | ICD-10-CM

## 2018-03-22 DIAGNOSIS — R7301 Impaired fasting glucose: Secondary | ICD-10-CM | POA: Diagnosis not present

## 2018-03-22 DIAGNOSIS — G4733 Obstructive sleep apnea (adult) (pediatric): Secondary | ICD-10-CM

## 2018-03-22 DIAGNOSIS — I10 Essential (primary) hypertension: Secondary | ICD-10-CM

## 2018-03-22 LAB — POCT GLYCOSYLATED HEMOGLOBIN (HGB A1C): HEMOGLOBIN A1C: 5.9

## 2018-03-22 MED ORDER — CELECOXIB 200 MG PO CAPS: 200.0000 mg | ORAL_CAPSULE | Freq: Two times a day (BID) | ORAL | 0 refills | Status: DC

## 2018-03-22 MED ORDER — ALBUTEROL SULFATE (2.5 MG/3ML) 0.083% IN NEBU: 2.5000 mg | INHALATION_SOLUTION | RESPIRATORY_TRACT | 6 refills | Status: DC | PRN

## 2018-03-22 MED ORDER — ALBUTEROL SULFATE HFA 108 (90 BASE) MCG/ACT IN AERS: 2.0000 | INHALATION_SPRAY | Freq: Four times a day (QID) | RESPIRATORY_TRACT | 6 refills | Status: DC | PRN

## 2018-03-22 NOTE — Progress Notes (Signed)
Subjective:    CC: BP,   HPI:  Hypertension- Pt denies chest pain, SOB, dizziness, or heart palpitations.  Taking meds as directed w/o problems.  Denies medication side effects.    Impaired fasting glucose-no increased thirst or urination. No symptoms consistent with hypoglycemia.  F/U asthma - she is doing well.  She is using her Advair daily.  She has been using her albuterol some.  She is been noticing some occasional wheezing at night night but she is also had a lot of drainage with spring allergies.  He does need a refill on her albuterol today.  BMI  63 - she is doing well.  She is scheduled for gastric sleeve for July 10th.  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  They did get her tested for sleep apnea and she does have it and so she has been wearing CPAP.  She is actually noticed that she is had a lot more energy since being on the CPAP.   Past medical history, Surgical history, Family history not pertinant except as noted below, Social history, Allergies, and medications have been entered into the medical record, reviewed, and corrections made.   Review of Systems: No fevers, chills, night sweats, weight loss, chest pain, or shortness of breath.   Objective:    General: Well Developed, well nourished, and in no acute distress.  Neuro: Alert and oriented x3, extra-ocular muscles intact, sensation grossly intact.  HEENT: Normocephalic, atraumatic  Skin: Warm and dry, no rashes. Cardiac: Regular rate and rhythm, no murmurs rubs or gallops, no lower extremity edema.  Respiratory: Clear to auscultation bilaterally. Not using accessory muscles, speaking in full sentences.   Impression and Recommendations:    HTN - Well controlled. Continue current regimen. Follow up in  6 months.  Hopefully she will be able to come off of her HCTZ when I see her next time.  IFG - Well controlled. Continue current regimen. Follow  up in  6 months.  I then she will of had her gastric sleeve and hopefully her A1c will go back down into the normal range.  Asthma -stable.  Continue Advair and as needed albuterol.  I hope that she will be able to stepdown her therapy when I see her back.  Sleep apnea-currently using CPAP.  Again hopefully will be able to come off as she loses weight over the next 6 to 12 months status post gastric sleeve.  BMI > 60 - scheduled for gastric sleeve.

## 2018-04-02 ENCOUNTER — Telehealth: Payer: Self-pay

## 2018-04-02 NOTE — Telephone Encounter (Signed)
Patient called stated that she would like to get injections approved for both knees. She stated that during her last visit she was told to call the office if she wanted to get those injections done. Please advise on what I need to call the patient and tell her. [name],CMA

## 2018-04-05 NOTE — Telephone Encounter (Signed)
Information has been submitted to Orthovisc and awaiting determination.   

## 2018-04-05 NOTE — Telephone Encounter (Signed)
Please prior authorize Monovisc or Orthovisc or Synvisc 1. Inform patient that we have started the process and informed the patient and me when done.

## 2018-04-06 NOTE — Telephone Encounter (Signed)
Left message for patient to call back about note below.  Orthovisc is covered. There is no copay. The deductible and out of pocket have been met, the patient's responsibility will be $0. Call reference number is [id number].

## 2018-04-07 NOTE — Telephone Encounter (Signed)
Left detailed message that patient was approved through her insurance at no cost to the patient. Patient was asked to call back to set up an appointment. Closing encounter.

## 2018-04-13 NOTE — Telephone Encounter (Signed)
Per patient request she is waiting for her surgery to be completed before starting the injections. Injections scheduled.

## 2018-06-14 ENCOUNTER — Ambulatory Visit: Payer: 59 | Admitting: Family Medicine

## 2018-06-21 ENCOUNTER — Ambulatory Visit: Payer: 59 | Admitting: Family Medicine

## 2018-06-28 ENCOUNTER — Ambulatory Visit: Payer: 59 | Admitting: Family Medicine

## 2018-06-30 MED ORDER — KCL IN DEXTROSE-NACL 20-5-0.45 MEQ/L-%-% IV SOLN
INTRAVENOUS | Status: DC
Start: ? — End: 2018-06-30

## 2018-06-30 MED ORDER — ALBUTEROL SULFATE (2.5 MG/3ML) 0.083% IN NEBU
2.50 | INHALATION_SOLUTION | RESPIRATORY_TRACT | Status: DC
Start: ? — End: 2018-06-30

## 2018-06-30 MED ORDER — ALBUTEROL SULFATE HFA 108 (90 BASE) MCG/ACT IN AERS
2.00 | INHALATION_SPRAY | RESPIRATORY_TRACT | Status: DC
Start: ? — End: 2018-06-30

## 2018-06-30 MED ORDER — FLUTICASONE FUROATE-VILANTEROL 100-25 MCG/INH IN AEPB
1.00 | INHALATION_SPRAY | RESPIRATORY_TRACT | Status: DC
Start: 2018-07-01 — End: 2018-06-30

## 2018-06-30 MED ORDER — HYDROCODONE-ACETAMINOPHEN 7.5-325 MG/15ML PO SOLN
15.00 | ORAL | Status: DC
Start: ? — End: 2018-06-30

## 2018-06-30 MED ORDER — FAMOTIDINE 20 MG/2ML IV SOLN
20.00 | INTRAVENOUS | Status: DC
Start: 2018-06-30 — End: 2018-06-30

## 2018-06-30 MED ORDER — GENERIC EXTERNAL MEDICATION
30.00 | Status: DC
Start: 2018-06-30 — End: 2018-06-30

## 2018-06-30 MED ORDER — DIPHENHYDRAMINE HCL 50 MG/ML IJ SOLN
12.50 | INTRAMUSCULAR | Status: DC
Start: 2018-06-30 — End: 2018-06-30

## 2018-06-30 MED ORDER — HYDROMORPHONE HCL 1 MG/ML IJ SOLN
0.50 | INTRAMUSCULAR | Status: DC
Start: ? — End: 2018-06-30

## 2018-06-30 MED ORDER — ONDANSETRON HCL 4 MG/2ML IJ SOLN
4.00 | INTRAMUSCULAR | Status: DC
Start: ? — End: 2018-06-30

## 2018-06-30 MED ORDER — ENOXAPARIN SODIUM 40 MG/0.4ML ~~LOC~~ SOLN
40.00 | SUBCUTANEOUS | Status: DC
Start: ? — End: 2018-06-30

## 2018-07-06 ENCOUNTER — Ambulatory Visit: Payer: 59 | Admitting: Family Medicine

## 2018-08-02 ENCOUNTER — Ambulatory Visit (INDEPENDENT_AMBULATORY_CARE_PROVIDER_SITE_OTHER): Payer: 59 | Admitting: Family Medicine

## 2018-08-02 ENCOUNTER — Encounter: Payer: Self-pay | Admitting: Family Medicine

## 2018-08-02 VITALS — BP 128/80 | HR 100 | Ht 60.0 in | Wt 275.0 lb

## 2018-08-02 DIAGNOSIS — M170 Bilateral primary osteoarthritis of knee: Secondary | ICD-10-CM | POA: Diagnosis not present

## 2018-08-02 DIAGNOSIS — G8929 Other chronic pain: Secondary | ICD-10-CM | POA: Diagnosis not present

## 2018-08-02 DIAGNOSIS — M25562 Pain in left knee: Secondary | ICD-10-CM

## 2018-08-02 DIAGNOSIS — M25561 Pain in right knee: Secondary | ICD-10-CM | POA: Diagnosis not present

## 2018-08-02 NOTE — Patient Instructions (Addendum)
Thank you for coming in today.   Call or go to the ER if you develop a large red swollen joint with extreme pain or oozing puss.   

## 2018-08-02 NOTE — Progress Notes (Signed)
[name] is a 62 y.o. female who presents to [facility] today for bilateral knee pain.   She has a history of bilateral knee pain due to degenerative joint disease. She has been seen by [facility] and has received multiple corticosteroid and hyaluronic acid injections in the past. She has had much success with the HA injections. 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 She is now 30 days post op from her gastric sleeve and is doing very well. Her goal is to lose enough weight to ultimately get bilateral TKAs.     ROS:  As above  Exam:  BP 128/80   Pulse 100   Ht 5' (1.524 m)   Wt 275 lb (124.7 kg)   BMI 53.71 kg/m  General: Well Developed, well nourished, and in no acute distress.  Neuro/Psych: Alert and oriented x3, extra-ocular muscles intact, able to move all 4 extremities, sensation grossly intact. Skin: Warm and dry, no rashes noted.  Respiratory: Not using accessory muscles, speaking in full sentences, trachea midline.  Cardiovascular: Pulses palpable, no extremity edema. Abdomen: Does not appear distended. Left Knee: normal appearing with no obvious deformities.  Non tender to palpation over the joint line.  Crepitations present  Right Knee: normal appearing with no obvious deformities.  Non tender to palpation over the joint line.  Crepitations present   Procedure: Real-time Ultrasound Guided Injection of right knee Orthovisc 1/4 Device: GE Logiq E   Images permanently stored and available for review in the ultrasound unit. Verbal informed consent obtained.  Discussed risks and benefits of procedure. Warned about infection bleeding damage to structures skin hypopigmentation and fat atrophy among others. Patient expresses understanding and agreement Time-out conducted.   Noted no overlying erythema, induration, or other signs of local infection.   Skin  prepped in a sterile fashion.   Local anesthesia: Topical Ethyl chloride.   With sterile technique and under real time ultrasound guidance:  Orthovisc injected easily.   Completed without difficulty    Advised to call if fevers/chills, erythema, induration, drainage, or persistent bleeding.   Images permanently stored and available for review in the ultrasound unit.  Impression: Technically successful ultrasound guided injection.   Procedure: Real-time Ultrasound Guided Injection of the left knee Orthovisc 1/4 Device: GE Logiq E   Images permanently stored and available for review in the ultrasound unit. Verbal informed consent obtained.  Discussed risks and benefits of procedure. Warned about infection bleeding damage to structures skin hypopigmentation and fat atrophy among others. Patient expresses understanding and agreement Time-out conducted.   Noted no overlying erythema, induration, or other signs of local infection.   Skin prepped in a sterile fashion.   Local anesthesia: Topical Ethyl chloride.   With sterile technique and under real time ultrasound guidance:  Orthovisc injected easily.   Completed without difficulty    Advised to call if fevers/chills, erythema, induration, drainage, or persistent bleeding.   Images permanently stored and available for review in the ultrasound unit.  Impression: Technically successful ultrasound guided injection.   Lot [id number]     Lab and Radiology Results No results found for this or any previous visit (from the past 72 hour(s)). No results found.     Assessment and Plan: 62 y.o. female with bilateral knee DJD pain. She is 30 days post-op from gastric sleeve and is doing well. Her ultimate goal is to lose weight to have  bilateral TKAs. In the meantime we completed the first of a series of 4 OrthoVisc injections in both knees. She has had success with them in the past. She will continue to have injections weekly until the 4  injections are complete.  Injection 1/4 today. Return in 1 week for 2/4.    I spent 15 minutes with this patient, greater than 50% was face-to-face time counseling regarding ddx and plan.  Historical information moved to improve visibility of documentation.  Past Medical History:  Diagnosis Date  . Allergy    seasonal  . Anemia   . Arthritis   . Asthma   . 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 Blood transfusion without reported diagnosis    1980's  . Colon cancer ([place]) 2009   colon  . GERD (gastroesophageal reflux disease)   . Hiatal hernia   . Hypertension    Past Surgical History:  Procedure Laterality Date  . [place]  . CHOLECYSTECTOMY  1980  . COLONOSCOPY    . COLONOSCOPY WITH PROPOFOL N/A 10/02/2015   Procedure: COLONOSCOPY WITH PROPOFOL;  Surgeon: Ladene Artist, MD;  Location: [facility];  Service: Endoscopy;  Laterality: N/A;  . PARTIAL COLECTOMY  2009  . POLYPECTOMY    . TUBAL LIGATION  1989   Social History   Tobacco Use  . Smoking status: Never Smoker  . Smokeless tobacco: Never Used  Substance Use Topics  . Alcohol use: No    Alcohol/week: 0.0 standard drinks   family history includes Hypertension in her father and paternal grandfather; Stroke in her paternal grandmother.  Medications: Current Outpatient Medications  Medication Sig Dispense Refill  . albuterol (PROVENTIL HFA;VENTOLIN HFA) 108 (90 Base) MCG/ACT inhaler Inhale 2 puffs into the lungs every 6 (six) hours as needed for wheezing or shortness of breath. 1 Inhaler 6  . albuterol (PROVENTIL) (2.5 MG/3ML) 0.083% nebulizer solution Take 3 mLs (2.5 mg total) by nebulization every 4 (four) hours as needed for wheezing or shortness of breath (please include nebulizer machine, hoses, and mask if needed.). 30 vial 6  . celecoxib (CELEBREX) 200 MG capsule Take 1 capsule (200 mg total) by mouth 2 (two) times daily. 180 capsule 0  . cetirizine (ZYRTEC) 10 MG tablet  Take 10 mg by mouth daily.     . Cholecalciferol (VITAMIN D) 2000 UNITS CAPS Take 2,000 Units by mouth daily.    . fluticasone (FLONASE) 50 MCG/ACT nasal spray Place 2 sprays into both nostrils as needed for allergies or rhinitis. 16 g 6  . Fluticasone-Salmeterol (ADVAIR DISKUS) 250-50 MCG/DOSE AEPB Inhale 1 puff into the lungs 2 (two) times daily. 1 each 11  . glucosamine-chondroitin 500-400 MG tablet Take 1 tablet by mouth daily.     . hydrochlorothiazide (HYDRODIURIL) 25 MG tablet Take 1 tablet (25 mg total) daily by mouth. 90 tablet 3  . metFORMIN (GLUCOPHAGE-XR) 500 MG 24 hr tablet Take 1 tablet (500 mg total) by mouth daily with breakfast. 90 tablet 3   No current facility-administered medications for this visit.    Allergies  Allergen Reactions  . Augmentin [Amoxicillin-Pot Clavulanate] Diarrhea  . Phentermine     Numbness tingling/feels weird.       Discussed warning signs or symptoms. Please see discharge instructions. Patient expresses understanding.  I personally was present and performed or re-performed the history, physical exam and medical decision-making activities of this service and have verified that the service and findings  are accurately documented in the student's note. ___________________________________________ Lynne Leader M.D., ABFM., CAQSM. Primary Care and Sports Medicine Adjunct Instructor of [employer]

## 2018-08-09 ENCOUNTER — Ambulatory Visit (INDEPENDENT_AMBULATORY_CARE_PROVIDER_SITE_OTHER): Payer: 59 | Admitting: Family Medicine

## 2018-08-09 ENCOUNTER — Encounter: Payer: Self-pay | Admitting: Family Medicine

## 2018-08-09 VITALS — BP 137/66 | HR 89 | Ht 60.0 in | Wt 274.0 lb

## 2018-08-09 DIAGNOSIS — M25562 Pain in left knee: Secondary | ICD-10-CM

## 2018-08-09 DIAGNOSIS — M170 Bilateral primary osteoarthritis of knee: Secondary | ICD-10-CM

## 2018-08-09 DIAGNOSIS — G8929 Other chronic pain: Secondary | ICD-10-CM

## 2018-08-09 DIAGNOSIS — M25561 Pain in right knee: Secondary | ICD-10-CM

## 2018-08-09 NOTE — Patient Instructions (Signed)
Thank you for coming in today.   Call or go to the ER if you develop a large red swollen joint with extreme pain or oozing puss.   

## 2018-08-09 NOTE — Progress Notes (Signed)
[place] presents to clinic today for previously arranged Orthovisc injection bilateral knee  Orthovisc 2/4  Procedure: Real-time Ultrasound Guided Injection of right knee Orthovisc Device: GE Logiq E   Images permanently stored and available for review in the ultrasound unit. Verbal informed consent obtained.  Discussed risks and benefits of procedure. Warned about infection bleeding damage to structures skin hypopigmentation and fat atrophy among others. Patient expresses understanding and agreement Time-out conducted.   Noted no overlying erythema, induration, or other signs of local infection.   Skin prepped in a sterile fashion.   Local anesthesia: Topical Ethyl chloride.   With sterile technique and under real time ultrasound guidance:  Orthovisc injected easily.   Completed without difficulty     Advised to call if fevers/chills, erythema, induration, drainage, or persistent bleeding.   Images permanently stored and available for review in the ultrasound unit.  Impression: Technically successful ultrasound guided injection.   Procedure: Real-time Ultrasound Guided Injection of left knee Orthovisc Device: GE Logiq E   Images permanently stored and available for review in the ultrasound unit. Verbal informed consent obtained.  Discussed risks and benefits of procedure. Warned about infection bleeding damage to structures skin hypopigmentation and fat atrophy among others. Patient expresses understanding and agreement Time-out conducted.   Noted no overlying erythema, induration, or other signs of local infection.   Skin prepped in a sterile fashion.   Local anesthesia: Topical Ethyl chloride.   With sterile technique and under real time ultrasound guidance:  Orthovisc injected easily.   Completed without difficulty     Advised to call if fevers/chills, erythema, induration, drainage, or persistent bleeding.   Images permanently stored and available for review in the ultrasound  unit.  Impression: Technically successful ultrasound guided injection.  Lot number: [id number]  Return in 1 week for Orthovisc injection bilateral knee 3/4

## 2018-08-16 ENCOUNTER — Ambulatory Visit (INDEPENDENT_AMBULATORY_CARE_PROVIDER_SITE_OTHER): Payer: 59 | Admitting: Family Medicine

## 2018-08-16 ENCOUNTER — Encounter: Payer: Self-pay | Admitting: Family Medicine

## 2018-08-16 VITALS — BP 121/79 | HR 87 | Ht 60.0 in | Wt 271.0 lb

## 2018-08-16 DIAGNOSIS — M25562 Pain in left knee: Secondary | ICD-10-CM

## 2018-08-16 DIAGNOSIS — M25561 Pain in right knee: Secondary | ICD-10-CM | POA: Diagnosis not present

## 2018-08-16 DIAGNOSIS — G8929 Other chronic pain: Secondary | ICD-10-CM

## 2018-08-16 DIAGNOSIS — M170 Bilateral primary osteoarthritis of knee: Secondary | ICD-10-CM

## 2018-08-16 NOTE — Progress Notes (Signed)
[name] presents to clinic today for previously scheduled Orthovisc injection 3/4  Procedure: Real-time Ultrasound Guided Injection of right knee Orthovisc Device: GE Logiq E   Images permanently stored and available for review in the ultrasound unit. Verbal informed consent obtained.  Discussed risks and benefits of procedure. Warned about infection bleeding damage to structures skin hypopigmentation and fat atrophy among others. Patient expresses understanding and agreement Time-out conducted.   Noted no overlying erythema, induration, or other signs of local infection.   Skin prepped in a sterile fashion.   Local anesthesia: Topical Ethyl chloride.   With sterile technique and under real time ultrasound guidance:  With Orthovisc injected easily.   Completed without difficulty    Advised to call if fevers/chills, erythema, induration, drainage, or persistent bleeding.   Images permanently stored and available for review in the ultrasound unit.  Impression: Technically successful ultrasound guided injection.   Procedure: Real-time Ultrasound Guided Injection of left knee Orthovisc Device: GE Logiq E   Images permanently stored and available for review in the ultrasound unit. Verbal informed consent obtained.  Discussed risks and benefits of procedure. Warned about infection bleeding damage to structures skin hypopigmentation and fat atrophy among others. Patient expresses understanding and agreement Time-out conducted.   Noted no overlying erythema, induration, or other signs of local infection.   Skin prepped in a sterile fashion.   Local anesthesia: Topical Ethyl chloride.   With sterile technique and under real time ultrasound guidance:  Orthovisc injected easily.   Completed without difficulty    Advised to call if fevers/chills, erythema, induration, drainage, or persistent bleeding.   Images permanently stored and available for review in the ultrasound unit.  Impression:  Technically successful ultrasound guided injection.  Lot #[id number]  Return in 1 week for Orthovisc injection 4/4

## 2018-08-16 NOTE — Patient Instructions (Signed)
Thank you for coming in today. Return for last visit.

## 2018-08-23 ENCOUNTER — Encounter: Payer: Self-pay | Admitting: Family Medicine

## 2018-08-23 ENCOUNTER — Ambulatory Visit (INDEPENDENT_AMBULATORY_CARE_PROVIDER_SITE_OTHER): Payer: 59 | Admitting: Family Medicine

## 2018-08-23 VITALS — BP 131/74 | HR 90 | Wt 274.0 lb

## 2018-08-23 DIAGNOSIS — M25561 Pain in right knee: Secondary | ICD-10-CM

## 2018-08-23 DIAGNOSIS — G8929 Other chronic pain: Secondary | ICD-10-CM

## 2018-08-23 DIAGNOSIS — M170 Bilateral primary osteoarthritis of knee: Secondary | ICD-10-CM

## 2018-08-23 DIAGNOSIS — M25562 Pain in left knee: Secondary | ICD-10-CM

## 2018-08-23 NOTE — Progress Notes (Signed)
  [place] presents to clinic today for previously arranged bilateral Orthovisc injections series 4/4  Procedure: Real-time Ultrasound Guided Injection of left knee Orthovisc Device: GE Logiq E   Images permanently stored and available for review in the ultrasound unit. Verbal informed consent obtained.  Discussed risks and benefits of procedure. Warned about infection bleeding damage to structures skin hypopigmentation and fat atrophy among others. Patient expresses understanding and agreement Time-out conducted.   Noted no overlying erythema, induration, or other signs of local infection.   Skin prepped in a sterile fashion.   Local anesthesia: Topical Ethyl chloride.   With sterile technique and under real time ultrasound guidance:  Orthovisc injected easily.   Completed without difficulty    Advised to call if fevers/chills, erythema, induration, drainage, or persistent bleeding.   Images permanently stored and available for review in the ultrasound unit.  Impression: Technically successful ultrasound guided injection.    Procedure: Real-time Ultrasound Guided Injection of right knee Orthovisc Device: GE Logiq E   Images permanently stored and available for review in the ultrasound unit. Verbal informed consent obtained.  Discussed risks and benefits of procedure. Warned about infection bleeding damage to structures skin hypopigmentation and fat atrophy among others. Patient expresses understanding and agreement Time-out conducted.   Noted no overlying erythema, induration, or other signs of local infection.   Skin prepped in a sterile fashion.   Local anesthesia: Topical Ethyl chloride.   With sterile technique and under real time ultrasound guidance:  Orthovisc injected easily.   Completed without difficulty    Advised to call if fevers/chills, erythema, induration, drainage, or persistent bleeding.   Images permanently stored and available for review in the ultrasound unit.   Impression: Technically successful ultrasound guided injection.  Lot #[id number]  Series completed  Recheck as needed.  Of note vaccine history updated.  Patient recently received flu vaccine and pneumonia 23 vaccine.

## 2018-09-06 ENCOUNTER — Encounter: Payer: Self-pay | Admitting: Family Medicine

## 2018-09-06 ENCOUNTER — Ambulatory Visit (INDEPENDENT_AMBULATORY_CARE_PROVIDER_SITE_OTHER): Payer: 59 | Admitting: Family Medicine

## 2018-09-06 VITALS — BP 132/67 | HR 80 | Ht 59.45 in | Wt 256.0 lb

## 2018-09-06 DIAGNOSIS — R7301 Impaired fasting glucose: Secondary | ICD-10-CM

## 2018-09-06 DIAGNOSIS — I10 Essential (primary) hypertension: Secondary | ICD-10-CM | POA: Diagnosis not present

## 2018-09-06 DIAGNOSIS — N950 Postmenopausal bleeding: Secondary | ICD-10-CM

## 2018-09-06 DIAGNOSIS — B379 Candidiasis, unspecified: Secondary | ICD-10-CM | POA: Diagnosis not present

## 2018-09-06 DIAGNOSIS — R21 Rash and other nonspecific skin eruption: Secondary | ICD-10-CM

## 2018-09-06 LAB — POCT GLYCOSYLATED HEMOGLOBIN (HGB A1C): HEMOGLOBIN A1C: 5.7 % — AB (ref 4.0–5.6)

## 2018-09-06 MED ORDER — TRIAMCINOLONE ACETONIDE 0.5 % EX OINT: 1.0000 "application " | TOPICAL_OINTMENT | Freq: Two times a day (BID) | CUTANEOUS | 0 refills | Status: DC

## 2018-09-06 MED ORDER — FLUCONAZOLE 150 MG PO TABS: 150.0000 mg | ORAL_TABLET | Freq: Once | ORAL | 0 refills | Status: AC

## 2018-09-06 NOTE — Progress Notes (Signed)
Subjective:    CC:   HPI:  62 yo female is her today to f/u for chronic issues after gastric sleeve surgery on 06/29/18.  She is currently off her hydrochlorthiazide and her metformin and doing well. She has lost over 50 lbs so far.     Asthma -she is currently doing well and is off her Advair.  No recent flares or exacerbations.   HTN - she is off her medication s/p bypass.    She got rash on her sides from a bath bomb. She took some zyrtec and switched to an oatmeal soap. She says it is little better but just not completely gone.  She is also been putting some over-the-counter hydrocortisone cream on it.  She has had a lot of itching around her vulvar area as well. Impaired fasting glucose-no increased thirst or urination. No symptoms consistent with hypoglycemia.   Past medical history, Surgical history, Family history not pertinant except as noted below, Social history, Allergies, and medications have been entered into the medical record, reviewed, and corrections made.   Review of Systems: No fevers, chills, night sweats, weight loss, chest pain, or shortness of breath.   Objective:    General: Well Developed, well nourished, and in no acute distress.  Neuro: Alert and oriented x3, extra-ocular muscles intact, sensation grossly intact.  HEENT: Normocephalic, atraumatic  Skin: Warm and dry, no rashes. Cardiac: Regular rate and rhythm, no murmurs rubs or gallops, no lower extremity edema.  Respiratory: Clear to auscultation bilaterally. Not using accessory muscles, speaking in full sentences.   Impression and Recommendations:    Asthma -mild, intermittent.  Doing really well.  Off of Advair.  Albuterol as needed.  HTN - Well controlled. Off of medication. Due for CMP and lipids.    Rash -most consistent with folliculitis.  Will treat with topical steroid cream.  New prescription sent to pharmacy.  She is also had some perineal itching so we will also send over tab of  Diflucan.  IFG - Well controlled. Continue current regimen. Follow up in  6 mtonsh.    Postmenopausal bleeding-she had a work-up approximately 1 year ago for similar symptoms with a negative endometrial biopsy.  Will refer her back to GYN we will also check hormone levels.

## 2018-09-21 LAB — COMPLETE METABOLIC PANEL WITH GFR
AG RATIO: 1.4 (calc) (ref 1.0–2.5)
ALT: 11 U/L (ref 6–29)
AST: 15 U/L (ref 10–35)
Albumin: 3.8 g/dL (ref 3.6–5.1)
Alkaline phosphatase (APISO): 99 U/L (ref 33–130)
BUN: 9 mg/dL (ref 7–25)
CALCIUM: 9.5 mg/dL (ref 8.6–10.4)
CO2: 28 mmol/L (ref 20–32)
CREATININE: 0.84 mg/dL (ref 0.50–0.99)
Chloride: 105 mmol/L (ref 98–110)
GFR, Est African American: 87 mL/min/{1.73_m2} (ref >=60)
GFR, Est Non African American: 75 mL/min/{1.73_m2} (ref >=60)
GLUCOSE: 103 mg/dL — AB (ref 65–99)
Globulin: 2.8 g/dL (calc) (ref 1.9–3.7)
POTASSIUM: 3.9 mmol/L (ref 3.5–5.3)
Sodium: 140 mmol/L (ref 135–146)
Total Bilirubin: 1.1 mg/dL (ref 0.2–1.2)
Total Protein: 6.6 g/dL (ref 6.1–8.1)

## 2018-09-21 LAB — LIPID PANEL
Cholesterol: 201 mg/dL — ABNORMAL HIGH (ref <200)
HDL: 44 mg/dL — AB (ref >50)
LDL CHOLESTEROL (CALC): 134 mg/dL — AB
NON-HDL CHOLESTEROL (CALC): 157 mg/dL — AB (ref <130)
TRIGLYCERIDES: 115 mg/dL (ref <150)
Total CHOL/HDL Ratio: 4.6 (calc) (ref <5.0)

## 2018-09-21 LAB — ESTRADIOL: ESTRADIOL: 26 pg/mL

## 2018-09-21 LAB — PROGESTERONE

## 2018-09-21 LAB — TSH: TSH: 0.62 m[IU]/L (ref 0.40–4.50)

## 2018-09-21 LAB — FOLLICLE STIMULATING HORMONE: FSH: 72.3 m[IU]/mL

## 2018-09-21 LAB — LUTEINIZING HORMONE: LH: 45.3 m[IU]/mL

## 2018-10-07 ENCOUNTER — Ambulatory Visit: Payer: 59 | Admitting: Obstetrics & Gynecology

## 2018-11-15 ENCOUNTER — Encounter: Payer: Self-pay | Admitting: Obstetrics & Gynecology

## 2018-11-15 ENCOUNTER — Ambulatory Visit (INDEPENDENT_AMBULATORY_CARE_PROVIDER_SITE_OTHER): Payer: 59 | Admitting: Obstetrics & Gynecology

## 2018-11-15 VITALS — BP 125/77 | HR 85 | Resp 16 | Ht 60.0 in | Wt 243.0 lb

## 2018-11-15 DIAGNOSIS — N950 Postmenopausal bleeding: Secondary | ICD-10-CM

## 2018-11-15 NOTE — Progress Notes (Signed)
   Subjective:    Patient ID: [name], female    DOB: [birth year], 63 y.o.   MRN: [id number]  HPI  63 yo married P3 (all cesarean sections) here with PMB. This first occurred about 18 months ago. She was evaluated by Dr. Ihor Dow in 9/18. She had an u/s and EMBX, both normal. The bleeding started again about 3 months ago. It is light.  Review of Systems She has been a Marine scientist for 30 years. She is sexually active.    Objective:   Physical Exam Breathing, conversing, and ambulating normally Well nourished, well hydrated Black female, no apparent distress Abd- morbidly obese, benign EG, vagina, cervix- all normal, cervix is stenotic       Assessment & Plan:  Return of PMB- I will order an u/s And then would recommend a d&c with hysteroscopy.

## 2018-11-20 ENCOUNTER — Ambulatory Visit (HOSPITAL_BASED_OUTPATIENT_CLINIC_OR_DEPARTMENT_OTHER)
Admission: RE | Admit: 2018-11-20 | Discharge: 2018-11-20 | Disposition: A | Discharge status: Home / Self Care | Payer: 59 | Source: Ambulatory Visit | Attending: Obstetrics & Gynecology | Admitting: Obstetrics & Gynecology

## 2018-11-20 DIAGNOSIS — N950 Postmenopausal bleeding: Secondary | ICD-10-CM | POA: Diagnosis present

## 2018-12-10 LAB — HM MAMMOGRAPHY

## 2018-12-23 ENCOUNTER — Encounter: Payer: Self-pay | Admitting: Family Medicine

## 2019-01-06 ENCOUNTER — Encounter: Payer: Self-pay | Admitting: *Deleted

## 2019-01-11 ENCOUNTER — Encounter: Payer: Self-pay | Admitting: *Deleted

## 2019-01-17 ENCOUNTER — Ambulatory Visit: Payer: 59 | Admitting: Obstetrics & Gynecology

## 2019-01-26 ENCOUNTER — Telehealth: Payer: Self-pay | Admitting: *Deleted

## 2019-01-26 ENCOUNTER — Other Ambulatory Visit: Payer: Self-pay | Admitting: Obstetrics & Gynecology

## 2019-01-26 MED ORDER — MISOPROSTOL 200 MCG PO TABS: ORAL_TABLET | ORAL | 0 refills | Status: DC

## 2019-01-26 NOTE — Telephone Encounter (Signed)
-----   Message from Emily Filbert, MD sent at 01/26/2019  2:49 PM EDT ----- Please let her know that she does need the embx. I called in 400 mcg cytotec to take orally the night prior to biopsy. Thanks

## 2019-01-26 NOTE — Telephone Encounter (Signed)
LM on voicemail to take her Cytotec PO the night prior to her BX scheduled on 01/31/19

## 2019-01-26 NOTE — Progress Notes (Unsigned)
cytotec 400 mcg ordered orally to be taken the night prior to her endometrial biopsy.

## 2019-01-31 ENCOUNTER — Ambulatory Visit: Payer: 59 | Admitting: Obstetrics & Gynecology

## 2019-03-07 ENCOUNTER — Ambulatory Visit: Payer: 59 | Admitting: Family Medicine

## 2019-10-31 ENCOUNTER — Other Ambulatory Visit: Payer: Self-pay

## 2019-10-31 ENCOUNTER — Ambulatory Visit (INDEPENDENT_AMBULATORY_CARE_PROVIDER_SITE_OTHER): Payer: 59 | Admitting: Family Medicine

## 2019-10-31 ENCOUNTER — Encounter: Payer: Self-pay | Admitting: Family Medicine

## 2019-10-31 VITALS — BP 132/89 | HR 77 | Ht 60.0 in | Wt 208.0 lb

## 2019-10-31 DIAGNOSIS — I10 Essential (primary) hypertension: Secondary | ICD-10-CM | POA: Diagnosis not present

## 2019-10-31 DIAGNOSIS — Z23 Encounter for immunization: Secondary | ICD-10-CM | POA: Diagnosis not present

## 2019-10-31 DIAGNOSIS — J4540 Moderate persistent asthma, uncomplicated: Secondary | ICD-10-CM | POA: Diagnosis not present

## 2019-10-31 DIAGNOSIS — R7301 Impaired fasting glucose: Secondary | ICD-10-CM | POA: Diagnosis not present

## 2019-10-31 DIAGNOSIS — L720 Epidermal cyst: Secondary | ICD-10-CM

## 2019-10-31 LAB — POCT GLYCOSYLATED HEMOGLOBIN (HGB A1C): Hemoglobin A1C: 5.3 % (ref 4.0–5.6)

## 2019-10-31 MED ORDER — ALBUTEROL SULFATE HFA 108 (90 BASE) MCG/ACT IN AERS: 2.0000 | INHALATION_SPRAY | Freq: Four times a day (QID) | RESPIRATORY_TRACT | 2 refills | Status: DC | PRN

## 2019-10-31 MED ORDER — ALBUTEROL SULFATE (2.5 MG/3ML) 0.083% IN NEBU: 2.5000 mg | INHALATION_SOLUTION | RESPIRATORY_TRACT | 2 refills | Status: DC | PRN

## 2019-10-31 MED ORDER — FLUTICASONE PROPIONATE 50 MCG/ACT NA SUSP: 2.0000 | NASAL | 6 refills | Status: DC | PRN

## 2019-10-31 NOTE — Assessment & Plan Note (Signed)
Bp looks great.  She is not currently on any medication.  Weight loss is fantastic.

## 2019-10-31 NOTE — Assessment & Plan Note (Signed)
She is doing phenomenally.  Continue to work on diet and adding in exercise to really boost her metabolism.

## 2019-10-31 NOTE — Assessment & Plan Note (Signed)
A1c looks great today.  Continue to work on healthy diet start adding in regular exercise.  Follow-up in 6 to 12 months.

## 2019-10-31 NOTE — Progress Notes (Signed)
Established Patient Office Visit  Subjective:  Patient ID: [name], female    DOB: [birth year]  Age: 63 y.o. MRN: [id number]  CC:  Chief Complaint  Patient presents with  . Hypertension  . IFG    HPI [name] presents for   Hypertension- Pt denies chest pain, SOB, dizziness, or heart palpitations.  Taking meds as directed w/o problems.  Denies medication side effects.    Impaired fasting glucose-no increased thirst or urination. No symptoms consistent with hypoglycemia.  F/U Asthma -overall she is actually doing well but does need a refill on her albuterol inhaler and nebulizer treatment.  She just wants to make sure that she has not expired.  Her that this be printed.  She has actually lost a significant amount of weight.  She is down to about 207 at home and says that she has plateaued but plans on starting to exercise.  She has elliptical bike at home.  She just feels better and feels like she has more energy.  She is really been just trying to focus on portion control.  She also has a knot on the back of her neck that she noticed since the summertime.  She said she tried to squeeze on it but it just got a little larger.  It never had any drainage or discharge but now it is a little bit tender.  Past Medical History:  Diagnosis Date  . Allergy    seasonal  . Anemia   . Arthritis   . Asthma   . 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 Blood transfusion without reported diagnosis    1980's  . Colon cancer ([place]) 2009   colon  . GERD (gastroesophageal reflux disease)   . Hiatal hernia   . Hypertension     Past Surgical History:  Procedure Laterality Date  . [place]  . CHOLECYSTECTOMY  1980  . COLONOSCOPY    . COLONOSCOPY WITH PROPOFOL N/A 10/02/2015   Procedure: COLONOSCOPY WITH PROPOFOL;  Surgeon: Ladene Artist, MD;  Location: [facility];  Service: Endoscopy;  Laterality: N/A;  . laparoscopic sleeve gastrectomy N/A     Surgeon- Demetrius Revel  . PARTIAL COLECTOMY  2009  . POLYPECTOMY    . TUBAL LIGATION  1989    Family History  Problem Relation Age of Onset  . Hypertension Father   . Stroke Paternal Grandmother   . Hypertension Paternal Grandfather   . Colon cancer Neg Hx   . Stomach cancer Neg Hx     Social History   Socioeconomic History  . Marital status: Married    Spouse name: Not on file  . Number of children: Not on file  . Years of education: Not on file  . Highest education level: Not on file  Occupational History  . Not on file  Tobacco Use  . Smoking status: Never Smoker  . Smokeless tobacco: Never Used  Substance and Sexual Activity  . Alcohol use: No    Alcohol/week: 0.0 standard drinks  . Drug use: No  . Sexual activity: Yes  Other Topics Concern  . Not on file  Social History Narrative  . Not on file   Social Determinants of Health   Financial Resource Strain:   . Difficulty of Paying Living Expenses: Not on file  Food Insecurity:   . Worried About Charity fundraiser in the Last Year: Not on file  .  Ran Out of Food in the Last Year: Not on file  Transportation Needs:   . Lack of Transportation (Medical): Not on file  . Lack of Transportation (Non-Medical): Not on file  Physical Activity:   . Days of Exercise per Week: Not on file  . Minutes of Exercise per Session: Not on file  Stress:   . Feeling of Stress : Not on file  Social Connections:   . Frequency of Communication with Friends and Family: Not on file  . Frequency of Social Gatherings with Friends and Family: Not on file  . Attends Religious Services: Not on file  . Active Member of Clubs or Organizations: Not on file  . Attends Archivist Meetings: Not on file  . Marital Status: Not on file  Intimate Partner Violence:   . Fear of Current or Ex-Partner: Not on file  . Emotionally Abused: Not on file  . Physically Abused: Not on file  . Sexually Abused: Not on file    Outpatient  Medications Prior to Visit  Medication Sig Dispense Refill  . cetirizine (ZYRTEC) 10 MG tablet Take 10 mg by mouth daily.     . Multiple Vitamin (MULTIVITAMIN) tablet Take 1 tablet by mouth daily.    Marland Kitchen albuterol (PROVENTIL HFA;VENTOLIN HFA) 108 (90 Base) MCG/ACT inhaler Inhale 2 puffs into the lungs every 6 (six) hours as needed for wheezing or shortness of breath. 1 Inhaler 6  . albuterol (PROVENTIL) (2.5 MG/3ML) 0.083% nebulizer solution Take 3 mLs (2.5 mg total) by nebulization every 4 (four) hours as needed for wheezing or shortness of breath (please include nebulizer machine, hoses, and mask if needed.). 30 vial 6  . docusate sodium (COLACE) 50 MG capsule Take 50 mg by mouth daily.    . misoprostol (CYTOTEC) 200 MCG tablet Take 2 pills by mouth the night before biopsy. 2 tablet 0  . triamcinolone ointment (KENALOG) 0.5 % Apply 1 application topically 2 (two) times daily. (Patient not taking: Reported on 11/15/2018) 30 g 0   No facility-administered medications prior to visit.    Allergies  Allergen Reactions  . Augmentin [Amoxicillin-Pot Clavulanate] Diarrhea  . Phentermine     Numbness tingling/feels weird.     ROS Review of Systems    Objective:    Physical Exam  Constitutional: She is oriented to person, place, and time. She appears well-developed and well-nourished.  HENT:  Head: Normocephalic and atraumatic.  Eyes: Conjunctivae and EOM are normal.  Cardiovascular: Normal rate, regular rhythm and normal heart sounds.  Pulmonary/Chest: Effort normal and breath sounds normal.  Neurological: She is alert and oriented to person, place, and time.  Skin: Skin is warm and dry. No pallor.  He has an epidermal cyst on the left posterior neck.  Psychiatric: She has a normal mood and affect. Her behavior is normal.  Vitals reviewed.   BP 132/89   Pulse 77   Ht 5' (1.524 m)   Wt 208 lb (94.3 kg)   SpO2 100%   BMI 40.62 kg/m  Wt Readings from Last 3 Encounters:  10/31/19  208 lb (94.3 kg)  11/15/18 243 lb (110.2 kg)  09/06/18 256 lb (116.1 kg)     There are no preventive care reminders to display for this patient.  There are no preventive care reminders to display for this patient.  Lab Results  Component Value Date   TSH 0.62 09/20/2018   Lab Results  Component Value Date   WBC 7.5 06/26/2017   HGB  13.8 06/26/2017   HCT 44.1 06/26/2017   MCV 77.2 (L) 06/26/2017   PLT 231 06/26/2017   Lab Results  Component Value Date   NA 140 09/20/2018   K 3.9 09/20/2018   CO2 28 09/20/2018   GLUCOSE 103 (H) 09/20/2018   BUN 9 09/20/2018   CREATININE 0.84 09/20/2018   BILITOT 1.1 09/20/2018   ALKPHOS 98 06/26/2017   AST 15 09/20/2018   ALT 11 09/20/2018   PROT 6.6 09/20/2018   ALBUMIN 3.6 06/26/2017   CALCIUM 9.5 09/20/2018   Lab Results  Component Value Date   CHOL 201 (H) 09/20/2018   Lab Results  Component Value Date   HDL 44 (L) 09/20/2018   Lab Results  Component Value Date   LDLCALC 134 (H) 09/20/2018   Lab Results  Component Value Date   TRIG 115 09/20/2018   Lab Results  Component Value Date   CHOLHDL 4.6 09/20/2018   Lab Results  Component Value Date   HGBA1C 5.3 10/31/2019      Assessment & Plan:   Problem List Items Addressed This Visit      Cardiovascular and Mediastinum   HTN (hypertension), benign - Primary    Bp looks great.  She is not currently on any medication.  Weight loss is fantastic.      Relevant Orders   CBC   COMPLETE METABOLIC PANEL WITH GFR   Lipid panel     Respiratory   Asthma    Currently well controlled.  Refill albuterol as needed.      Relevant Medications   albuterol (VENTOLIN HFA) 108 (90 Base) MCG/ACT inhaler   albuterol (PROVENTIL) (2.5 MG/3ML) 0.083% nebulizer solution     Endocrine   IFG (impaired fasting glucose)    A1c looks great today.  Continue to work on healthy diet start adding in regular exercise.  Follow-up in 6 to 12 months.      Relevant Orders   CBC    COMPLETE METABOLIC PANEL WITH GFR   Lipid panel   POCT HgB A1C (Completed)     Other   Morbid obesity ([place])    She is doing phenomenally.  Continue to work on diet and adding in exercise to really boost her metabolism.       Other Visit Diagnoses    Need for immunization against influenza       Relevant Orders   Flu Vaccine QUAD 36+ mos IM (Completed)   Epidermal cyst           Given shingles vaccine today.     Meds ordered this encounter  Medications  . albuterol (VENTOLIN HFA) 108 (90 Base) MCG/ACT inhaler    Sig: Inhale 2 puffs into the lungs every 6 (six) hours as needed for wheezing or shortness of breath.    Dispense:  18 g    Refill:  2  . albuterol (PROVENTIL) (2.5 MG/3ML) 0.083% nebulizer solution    Sig: Take 3 mLs (2.5 mg total) by nebulization every 4 (four) hours as needed for wheezing or shortness of breath (please include nebulizer machine, hoses, and mask if needed.).    Dispense:  30 mL    Refill:  2  . fluticasone (FLONASE) 50 MCG/ACT nasal spray    Sig: Place 2 sprays into both nostrils as needed for allergies or rhinitis.    Dispense:  16 g    Refill:  6    Follow-up: Return in about 6 months (around 04/30/2020) for Check BP and  2nd Shingrix.  Beatrice Lecher, MD

## 2019-10-31 NOTE — Assessment & Plan Note (Signed)
Currently well controlled.  Refill albuterol as needed.

## 2019-11-02 ENCOUNTER — Other Ambulatory Visit: Payer: Self-pay

## 2019-11-02 ENCOUNTER — Encounter: Payer: Self-pay | Admitting: Sports Medicine

## 2019-11-02 ENCOUNTER — Ambulatory Visit (INDEPENDENT_AMBULATORY_CARE_PROVIDER_SITE_OTHER): Payer: 59 | Admitting: Sports Medicine

## 2019-11-02 DIAGNOSIS — L723 Sebaceous cyst: Secondary | ICD-10-CM | POA: Diagnosis not present

## 2019-11-02 MED ORDER — HYDROCODONE-ACETAMINOPHEN 5-325 MG PO TABS: 1.0000 | ORAL_TABLET | Freq: Three times a day (TID) | ORAL | 0 refills | Status: DC | PRN

## 2019-11-02 NOTE — Progress Notes (Signed)
Subjective:    CC: Mass on neck  HPI: [name] is a very pleasant 63 year old female, she is noted a mass on the back of her neck, it is really nontender, she tried to drain it but did not get much out, symptoms are moderate, persistent, localized without radiation.  I reviewed the past medical history, family history, social history, surgical history, and allergies today and no changes were needed.  Please see the problem list section below in epic for further details.  Past Medical History: Past Medical History:  Diagnosis Date  . Allergy    seasonal  . Anemia   . Arthritis   . Asthma   . 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 Blood transfusion without reported diagnosis    1980's  . Colon cancer ([place]) 2009   colon  . GERD (gastroesophageal reflux disease)   . Hiatal hernia   . Hypertension    Past Surgical History: Past Surgical History:  Procedure Laterality Date  . [place]  . CHOLECYSTECTOMY  1980  . COLONOSCOPY    . COLONOSCOPY WITH PROPOFOL N/A 10/02/2015   Procedure: COLONOSCOPY WITH PROPOFOL;  Surgeon: Ladene Artist, MD;  Location: [facility];  Service: Endoscopy;  Laterality: N/A;  . laparoscopic sleeve gastrectomy N/A    Surgeon- Demetrius Revel  . PARTIAL COLECTOMY  2009  . POLYPECTOMY    . TUBAL LIGATION  1989   Social History: Social History   Socioeconomic History  . Marital status: Married    Spouse name: Not on file  . Number of children: Not on file  . Years of education: Not on file  . Highest education level: Not on file  Occupational History  . Not on file  Tobacco Use  . Smoking status: Never Smoker  . Smokeless tobacco: Never Used  Substance and Sexual Activity  . Alcohol use: No    Alcohol/week: 0.0 standard drinks  . Drug use: No  . Sexual activity: Yes  Other Topics Concern  . Not on file  Social History Narrative  . Not on file   Social Determinants of Health   Financial Resource Strain:    . Difficulty of Paying Living Expenses: Not on file  Food Insecurity:   . Worried About Charity fundraiser in the Last Year: Not on file  . Ran Out of Food in the Last Year: Not on file  Transportation Needs:   . Lack of Transportation (Medical): Not on file  . Lack of Transportation (Non-Medical): Not on file  Physical Activity:   . Days of Exercise per Week: Not on file  . Minutes of Exercise per Session: Not on file  Stress:   . Feeling of Stress : Not on file  Social Connections:   . Frequency of Communication with Friends and Family: Not on file  . Frequency of Social Gatherings with Friends and Family: Not on file  . Attends Religious Services: Not on file  . Active Member of Clubs or Organizations: Not on file  . Attends Archivist Meetings: Not on file  . Marital Status: Not on file   Family History: Family History  Problem Relation Age of Onset  . Hypertension Father   . Stroke Paternal Grandmother   . Hypertension Paternal Grandfather   . Colon cancer Neg Hx   . Stomach cancer Neg Hx    Allergies: Allergies  Allergen Reactions  . Augmentin [Amoxicillin-Pot Clavulanate] Diarrhea  .  Phentermine     Numbness tingling/feels weird.    Medications: See med rec.  Review of Systems: No fevers, chills, night sweats, weight loss, chest pain, or shortness of breath.   Objective:    General: Well Developed, well nourished, and in no acute distress.  Neuro: Alert and oriented x3, extra-ocular muscles intact, sensation grossly intact.  HEENT: Normocephalic, atraumatic, pupils equal round reactive to light, neck supple, no masses, no lymphadenopathy, thyroid nonpalpable.  Skin: Warm and dry, no rashes.  There is a 1.5 cm well-defined, subcutaneous movable mass on the back of her neck. Cardiac: Regular rate and rhythm, no murmurs rubs or gallops, no lower extremity edema.  Respiratory: Clear to auscultation bilaterally. Not using accessory muscles, speaking in  full sentences.  Procedure:  Excision of 1.5 cm subcutaneous mass, back of neck Risks, benefits, and alternatives explained and consent obtained. Time out conducted. Surface prepped with alcohol. 3cc lidocaine with epinephine infiltrated in a field block. Adequate anesthesia ensured. Area prepped and draped in a sterile fashion. Excision performed with: I made elliptical incision with a #10 blade then using both sharp and blunt dissection I removed the lesion en bloc, and then closed the incision with 3 3-0 simple interrupted Ethilon sutures. Hemostasis achieved. Pt stable.  Impression and Recommendations:    Sebaceous cyst Surgical excision, hydrocodone for postoperative pain, return in 10 days for suture removal.   ___________________________________________ Gwen Her. Dianah Field, M.D., ABFM., CAQSM. Primary Care and Sports Medicine [facility]  Adjunct Professor of [employer]

## 2019-11-02 NOTE — Assessment & Plan Note (Signed)
Surgical excision, hydrocodone for postoperative pain, return in 10 days for suture removal.

## 2019-11-02 NOTE — Patient Instructions (Signed)
Incision Care, Adult °An incision is a surgical cut that is made through your skin. Most incisions are closed after surgery. Your incision may be closed with stitches (sutures), staples, skin glue, or adhesive strips. You may need to return to your health care provider to have sutures or staples removed. This may occur several days to several weeks after your surgery. The incision needs to be cared for properly to prevent infection. °How to care for your incision °Incision care ° °· Follow instructions from your health care provider about how to take care of your incision. Make sure you: °? Wash your hands with soap and water before you change the bandage (dressing). If soap and water are not available, use hand sanitizer. °? Change your dressing as told by your health care provider. °? Leave sutures, skin glue, or adhesive strips in place. These skin closures may need to stay in place for 2 weeks or longer. If adhesive strip edges start to loosen and curl up, you may trim the loose edges. Do not remove adhesive strips completely unless your health care provider tells you to do that. °· Check your incision area every day for signs of infection. Check for: °? More redness, swelling, or pain. °? More fluid or blood. °? Warmth. °? Pus or a bad smell. °· Ask your health care provider how to clean the incision. This may include: °? Using mild soap and water. °? Using a clean towel to pat the incision dry after cleaning it. °? Applying a cream or ointment. Do this only as told by your health care provider. °? Covering the incision with a clean dressing. °· Ask your health care provider when you can leave the incision uncovered. °· Do not take baths, swim, or use a hot tub until your health care provider approves. Ask your health care provider if you can take showers. You may only be allowed to take sponge baths for bathing. °Medicines °· If you were prescribed an antibiotic medicine, cream, or ointment, take or apply the  antibiotic as told by your health care provider. Do not stop taking or applying the antibiotic even if your condition improves. °· Take over-the-counter and prescription medicines only as told by your health care provider. °General instructions °· Limit movement around your incision to improve healing. °? Avoid straining, lifting, or exercise for the first month, or for as long as told by your health care provider. °? Follow instructions from your health care provider about returning to your normal activities. °? Ask your health care provider what activities are safe. °· Protect your incision from the sun when you are outside for the first 6 months, or for as long as told by your health care provider. Apply sunscreen around the scar or cover it up. °· Keep all follow-up visits as told by your health care provider. This is important. °Contact a health care provider if: °· Your have more redness, swelling, or pain around the incision. °· You have more fluid or blood coming from the incision. °· Your incision feels warm to the touch. °· You have pus or a bad smell coming from the incision. °· You have a fever or shaking chills. °· You are nauseous or you vomit. °· You are dizzy. °· Your sutures or staples come undone. °Get help right away if: °· You have a red streak coming from your incision. °· Your incision bleeds through the dressing and the bleeding does not stop with gentle pressure. °· The edges of   your incision open up and separate. °· You have severe pain. °· You have a rash. °· You are confused. °· You faint. °· You have trouble breathing and a fast heartbeat. °This information is not intended to replace advice given to you by your health care provider. Make sure you discuss any questions you have with your health care provider. °Document Released: 05/09/2005 Document Revised: 10/22/2018 Document Reviewed: 05/07/2016 °Elsevier Patient Education © 2020 [employer] Inc[employer] ° °

## 2019-11-03 LAB — COMPLETE METABOLIC PANEL WITH GFR
AG Ratio: 1.3 (calc) (ref 1.0–2.5)
ALT: 11 U/L (ref 6–29)
AST: 17 U/L (ref 10–35)
Albumin: 3.9 g/dL (ref 3.6–5.1)
Alkaline phosphatase (APISO): 97 U/L (ref 37–153)
BUN: 7 mg/dL (ref 7–25)
CO2: 27 mmol/L (ref 20–32)
Calcium: 9 mg/dL (ref 8.6–10.4)
Chloride: 104 mmol/L (ref 98–110)
Creat: 0.77 mg/dL (ref 0.50–0.99)
GFR, Est African American: 95 mL/min/{1.73_m2} (ref >=60)
GFR, Est Non African American: 82 mL/min/{1.73_m2} (ref >=60)
Globulin: 2.9 g/dL (calc) (ref 1.9–3.7)
Glucose, Bld: 89 mg/dL (ref 65–99)
Potassium: 4.1 mmol/L (ref 3.5–5.3)
Sodium: 141 mmol/L (ref 135–146)
Total Bilirubin: 1 mg/dL (ref 0.2–1.2)
Total Protein: 6.8 g/dL (ref 6.1–8.1)

## 2019-11-03 LAB — CBC
HCT: 46.2 % — ABNORMAL HIGH (ref 35.0–45.0)
Hemoglobin: 14.9 g/dL (ref 11.7–15.5)
MCH: 25.3 pg — ABNORMAL LOW (ref 27.0–33.0)
MCHC: 32.3 g/dL (ref 32.0–36.0)
MCV: 78.6 fL — ABNORMAL LOW (ref 80.0–100.0)
MPV: 10.6 fL (ref 7.5–12.5)
Platelets: 249 10*3/uL (ref 140–400)
RBC: 5.88 10*6/uL — ABNORMAL HIGH (ref 3.80–5.10)
RDW: 14.4 % (ref 11.0–15.0)
WBC: 6.4 10*3/uL (ref 3.8–10.8)

## 2019-11-03 LAB — LIPID PANEL
Cholesterol: 202 mg/dL — ABNORMAL HIGH (ref <200)
HDL: 62 mg/dL (ref >=50)
LDL Cholesterol (Calc): 123 mg/dL (calc) — ABNORMAL HIGH
Non-HDL Cholesterol (Calc): 140 mg/dL (calc) — ABNORMAL HIGH (ref <130)
Total CHOL/HDL Ratio: 3.3 (calc) (ref <5.0)
Triglycerides: 80 mg/dL (ref <150)

## 2019-11-11 ENCOUNTER — Other Ambulatory Visit: Payer: Self-pay

## 2019-11-11 ENCOUNTER — Ambulatory Visit (INDEPENDENT_AMBULATORY_CARE_PROVIDER_SITE_OTHER): Payer: 59 | Admitting: Sports Medicine

## 2019-11-11 DIAGNOSIS — L723 Sebaceous cyst: Secondary | ICD-10-CM

## 2019-11-11 NOTE — Progress Notes (Signed)
   Impression and Recommendations:    I have performed independent interpretation of the relevant labs and imaging ordered by this patient's other providers.  Sebaceous cyst Surgical excision about 10 days ago for a sebaceous cyst, #3 sutures removed today, incision is clean, dry, intact, return as needed.    ___________________________________________ Gwen Her. Dianah Field, M.D., ABFM., CAQSM. Primary Care and Sports Medicine [facility]  Adjunct Professor of [employer]

## 2019-11-11 NOTE — Assessment & Plan Note (Signed)
Surgical excision about 10 days ago for a sebaceous cyst, #3 sutures removed today, incision is clean, dry, intact, return as needed.

## 2020-05-24 ENCOUNTER — Telehealth: Payer: Self-pay | Admitting: Family Medicine

## 2020-05-24 NOTE — Telephone Encounter (Signed)
Patient called to schedule her second shingles shot. I see that she was due for the second one before 05/01/2019. She is wanting to get the second shingles vaccine again. Is this ok to do for a nurse visit? Please advise.

## 2020-05-25 NOTE — Telephone Encounter (Signed)
Can you call this patient and schedule a nurse visit for second shingles that was approved per PCP. Thanks.

## 2020-05-25 NOTE — Telephone Encounter (Signed)
Appointment has been made

## 2020-05-25 NOTE — Telephone Encounter (Signed)
Yes ok 

## 2020-06-01 ENCOUNTER — Other Ambulatory Visit: Payer: Self-pay

## 2020-06-01 ENCOUNTER — Ambulatory Visit (INDEPENDENT_AMBULATORY_CARE_PROVIDER_SITE_OTHER): Payer: 59 | Admitting: Family Medicine

## 2020-06-01 VITALS — Temp 97.9°F

## 2020-06-01 DIAGNOSIS — Z23 Encounter for immunization: Secondary | ICD-10-CM

## 2020-06-01 NOTE — Progress Notes (Signed)
Agree with documentation as above.   Halvor Behrend, MD  

## 2020-06-01 NOTE — Progress Notes (Signed)
Established Patient Office Visit  Subjective:  Patient ID: [name], female    DOB: 15-Mar-1956  Age: 64 y.o. MRN: [id number]  CC:  Chief Complaint  Patient presents with  . Immunizations    HPI [name] presents for shingles vaccine.   Past Medical History:  Diagnosis Date  . Allergy    seasonal  . Anemia   . Arthritis   . Asthma   . 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 Blood transfusion without reported diagnosis    1980's  . Colon cancer ([place]) 2009   colon  . GERD (gastroesophageal reflux disease)   . Hiatal hernia   . Hypertension     Past Surgical History:  Procedure Laterality Date  . [place]  . CHOLECYSTECTOMY  1980  . COLONOSCOPY    . COLONOSCOPY WITH PROPOFOL N/A 10/02/2015   Procedure: COLONOSCOPY WITH PROPOFOL;  Surgeon: Ladene Artist, MD;  Location: [facility];  Service: Endoscopy;  Laterality: N/A;  . laparoscopic sleeve gastrectomy N/A    Surgeon- Demetrius Revel  . PARTIAL COLECTOMY  2009  . POLYPECTOMY    . TUBAL LIGATION  1989    Family History  Problem Relation Age of Onset  . Hypertension Father   . Stroke Paternal Grandmother   . Hypertension Paternal Grandfather   . Colon cancer Neg Hx   . Stomach cancer Neg Hx     Social History   Socioeconomic History  . Marital status: Married    Spouse name: Not on file  . Number of children: Not on file  . Years of education: Not on file  . Highest education level: Not on file  Occupational History  . Not on file  Tobacco Use  . Smoking status: Never Smoker  . Smokeless tobacco: Never Used  Substance and Sexual Activity  . Alcohol use: No    Alcohol/week: 0.0 standard drinks  . Drug use: No  . Sexual activity: Yes  Other Topics Concern  . Not on file  Social History Narrative  . Not on file   Social Determinants of Health   Financial Resource Strain:   . Difficulty of Paying Living Expenses:   Food Insecurity:   . Worried  About Charity fundraiser in the Last Year:   . Arboriculturist in the Last Year:   Transportation Needs:   . Film/video editor (Medical):   Marland Kitchen Lack of Transportation (Non-Medical):   Physical Activity:   . Days of Exercise per Week:   . Minutes of Exercise per Session:   Stress:   . Feeling of Stress :   Social Connections:   . Frequency of Communication with Friends and Family:   . Frequency of Social Gatherings with Friends and Family:   . Attends Religious Services:   . Active Member of Clubs or Organizations:   . Attends Archivist Meetings:   Marland Kitchen Marital Status:   Intimate Partner Violence:   . Fear of Current or Ex-Partner:   . Emotionally Abused:   Marland Kitchen Physically Abused:   . Sexually Abused:     Outpatient Medications Prior to Visit  Medication Sig Dispense Refill  . albuterol (PROVENTIL) (2.5 MG/3ML) 0.083% nebulizer solution Take 3 mLs (2.5 mg total) by nebulization every 4 (four) hours as needed for wheezing or shortness of breath (please include nebulizer machine, hoses, and mask if needed.). 30 mL 2  .  albuterol (VENTOLIN HFA) 108 (90 Base) MCG/ACT inhaler Inhale 2 puffs into the lungs every 6 (six) hours as needed for wheezing or shortness of breath. 18 g 2  . cetirizine (ZYRTEC) 10 MG tablet Take 10 mg by mouth daily.     . fluticasone (FLONASE) 50 MCG/ACT nasal spray Place 2 sprays into both nostrils as needed for allergies or rhinitis. 16 g 6  . HYDROcodone-acetaminophen (NORCO/VICODIN) 5-325 MG tablet Take 1 tablet by mouth every 8 (eight) hours as needed for moderate pain. 15 tablet 0  . Multiple Vitamin (MULTIVITAMIN) tablet Take 1 tablet by mouth daily.     No facility-administered medications prior to visit.    Allergies  Allergen Reactions  . Augmentin [Amoxicillin-Pot Clavulanate] Diarrhea  . Phentermine     Numbness tingling/feels weird.     ROS Review of Systems    Objective:    Physical Exam  Temp 97.9 F (36.6 C) (Oral)  Wt  Readings from Last 3 Encounters:  11/02/19 208 lb (94.3 kg)  10/31/19 208 lb (94.3 kg)  11/15/18 243 lb (110.2 kg)     Health Maintenance Due  Topic Date Due  . COVID-19 Vaccine (1) Never done    There are no preventive care reminders to display for this patient.  Lab Results  Component Value Date   TSH 0.62 09/20/2018   Lab Results  Component Value Date   WBC 6.4 11/02/2019   HGB 14.9 11/02/2019   HCT 46.2 (H) 11/02/2019   MCV 78.6 (L) 11/02/2019   PLT 249 11/02/2019   Lab Results  Component Value Date   NA 141 11/02/2019   K 4.1 11/02/2019   CO2 27 11/02/2019   GLUCOSE 89 11/02/2019   BUN 7 11/02/2019   CREATININE 0.77 11/02/2019   BILITOT 1.0 11/02/2019   ALKPHOS 98 06/26/2017   AST 17 11/02/2019   ALT 11 11/02/2019   PROT 6.8 11/02/2019   ALBUMIN 3.6 06/26/2017   CALCIUM 9.0 11/02/2019   Lab Results  Component Value Date   CHOL 202 (H) 11/02/2019   Lab Results  Component Value Date   HDL 62 11/02/2019   Lab Results  Component Value Date   LDLCALC 123 (H) 11/02/2019   Lab Results  Component Value Date   TRIG 80 11/02/2019   Lab Results  Component Value Date   CHOLHDL 3.3 11/02/2019   Lab Results  Component Value Date   HGBA1C 5.3 10/31/2019      Assessment & Plan:  Shingles vaccine - Patient tolerated injection well without any complications.    Problem List Items Addressed This Visit    None    Visit Diagnoses    Need for shingles vaccine    -  Primary   Relevant Orders   Varicella-zoster vaccine IM (Shingrix) (Completed)      No orders of the defined types were placed in this encounter.   Follow-up: Return for as directed. Durene Romans, Monico Blitz, [place]

## 2020-06-02 LAB — HM MAMMOGRAPHY

## 2020-06-05 ENCOUNTER — Encounter: Payer: Self-pay | Admitting: Family Medicine

## 2020-09-21 ENCOUNTER — Telehealth: Payer: Self-pay | Admitting: Family Medicine

## 2020-09-21 NOTE — Telephone Encounter (Signed)
Pt called stating they wanted a physical, I asked if it was required before the end of the year. They informed me it was not. I did let the pt know that we do not do physical in December. They become upset and demanded that they see "that egyptian doctor" (Dr. Darene Lamer) instead for their physicals before the end of November. There is nothing open, on top of that they need to be seen by their PCP. Anice Paganini and myself tried to accomodated for the carters but we were unable to find something until next year. I apologized and offered first thing in January, they said they had to think about it and hung up on me.

## 2020-10-25 ENCOUNTER — Other Ambulatory Visit: Payer: Self-pay | Admitting: Nurse Practitioner

## 2020-10-25 DIAGNOSIS — Z20828 Contact with and (suspected) exposure to other viral communicable diseases: Secondary | ICD-10-CM

## 2020-10-25 MED ORDER — OSELTAMIVIR PHOSPHATE 75 MG PO CAPS: 75.0000 mg | ORAL_CAPSULE | Freq: Two times a day (BID) | ORAL | 0 refills | Status: DC

## 2020-10-25 MED ORDER — OSELTAMIVIR PHOSPHATE 75 MG PO CAPS: 75.0000 mg | ORAL_CAPSULE | Freq: Every day | ORAL | 0 refills | Status: DC

## 2020-10-25 NOTE — Progress Notes (Signed)
Video visit with patients spouse today with known exposure to influenza by family member. Patient is not symptomatic at this time. Will begin treatment for prophylactic exposure today.

## 2020-12-12 ENCOUNTER — Encounter: Payer: Self-pay | Admitting: Gastroenterology

## 2020-12-28 ENCOUNTER — Telehealth: Payer: Self-pay

## 2020-12-28 DIAGNOSIS — I10 Essential (primary) hypertension: Secondary | ICD-10-CM

## 2020-12-28 DIAGNOSIS — R7301 Impaired fasting glucose: Secondary | ICD-10-CM

## 2020-12-28 NOTE — Telephone Encounter (Signed)
Pt called and scheduled an appt for 01/21/2021 and would like if someone could call her when her labs are put in so that she can fast and have them completed before the visit

## 2020-12-30 NOTE — Telephone Encounter (Signed)
Labs ordered.

## 2021-01-01 NOTE — Telephone Encounter (Signed)
Pt advised.

## 2021-01-21 ENCOUNTER — Ambulatory Visit: Payer: 59 | Admitting: Family Medicine

## 2021-01-25 ENCOUNTER — Telehealth: Payer: Self-pay | Admitting: Family Medicine

## 2021-01-25 DIAGNOSIS — Z1211 Encounter for screening for malignant neoplasm of colon: Secondary | ICD-10-CM

## 2021-01-25 NOTE — Telephone Encounter (Signed)
Patient was in to pick up lab work & was requesting to have a colonoscopy done from [place]. I let patient know I would send a telephone message back to the PCP. AM

## 2021-02-05 ENCOUNTER — Other Ambulatory Visit (HOSPITAL_COMMUNITY)
Admission: RE | Admit: 2021-02-05 | Discharge: 2021-02-05 | Disposition: A | Discharge status: Home / Self Care | Payer: 59 | Source: Ambulatory Visit | Attending: Family Medicine | Admitting: Family Medicine

## 2021-02-05 ENCOUNTER — Ambulatory Visit (INDEPENDENT_AMBULATORY_CARE_PROVIDER_SITE_OTHER): Payer: 59 | Admitting: Family Medicine

## 2021-02-05 ENCOUNTER — Encounter: Payer: Self-pay | Admitting: Family Medicine

## 2021-02-05 ENCOUNTER — Other Ambulatory Visit: Payer: Self-pay

## 2021-02-05 VITALS — BP 123/61 | HR 79 | Ht 60.0 in | Wt 224.0 lb

## 2021-02-05 DIAGNOSIS — Z903 Acquired absence of stomach [part of]: Secondary | ICD-10-CM

## 2021-02-05 DIAGNOSIS — Z0000 Encounter for general adult medical examination without abnormal findings: Secondary | ICD-10-CM

## 2021-02-05 DIAGNOSIS — Z124 Encounter for screening for malignant neoplasm of cervix: Secondary | ICD-10-CM | POA: Insufficient documentation

## 2021-02-05 DIAGNOSIS — J4540 Moderate persistent asthma, uncomplicated: Secondary | ICD-10-CM

## 2021-02-05 DIAGNOSIS — E7800 Pure hypercholesterolemia, unspecified: Secondary | ICD-10-CM

## 2021-02-05 LAB — CBC
HCT: 47.1 % — ABNORMAL HIGH (ref 35.0–45.0)
Hemoglobin: 14.7 g/dL (ref 11.7–15.5)
MCH: 24.7 pg — ABNORMAL LOW (ref 27.0–33.0)
MCHC: 31.2 g/dL — ABNORMAL LOW (ref 32.0–36.0)
MCV: 79.3 fL — ABNORMAL LOW (ref 80.0–100.0)
MPV: 10.8 fL (ref 7.5–12.5)
Platelets: 244 10*3/uL (ref 140–400)
RBC: 5.94 10*6/uL — ABNORMAL HIGH (ref 3.80–5.10)
RDW: 14.2 % (ref 11.0–15.0)
WBC: 5.5 10*3/uL (ref 3.8–10.8)

## 2021-02-05 LAB — LIPID PANEL
Cholesterol: 205 mg/dL — ABNORMAL HIGH (ref <200)
HDL: 62 mg/dL (ref >=50)
LDL Cholesterol (Calc): 126 mg/dL (calc) — ABNORMAL HIGH
Non-HDL Cholesterol (Calc): 143 mg/dL (calc) — ABNORMAL HIGH (ref <130)
Total CHOL/HDL Ratio: 3.3 (calc) (ref <5.0)
Triglycerides: 75 mg/dL (ref <150)

## 2021-02-05 LAB — COMPLETE METABOLIC PANEL WITH GFR
AG Ratio: 1.4 (calc) (ref 1.0–2.5)
ALT: 9 U/L (ref 6–29)
AST: 15 U/L (ref 10–35)
Albumin: 3.7 g/dL (ref 3.6–5.1)
Alkaline phosphatase (APISO): 94 U/L (ref 37–153)
BUN: 9 mg/dL (ref 7–25)
CO2: 30 mmol/L (ref 20–32)
Calcium: 9.1 mg/dL (ref 8.6–10.4)
Chloride: 103 mmol/L (ref 98–110)
Creat: 0.75 mg/dL (ref 0.50–0.99)
GFR, Est African American: 98 mL/min/{1.73_m2} (ref >=60)
GFR, Est Non African American: 84 mL/min/{1.73_m2} (ref >=60)
Globulin: 2.7 g/dL (calc) (ref 1.9–3.7)
Glucose, Bld: 87 mg/dL (ref 65–99)
Potassium: 4.1 mmol/L (ref 3.5–5.3)
Sodium: 139 mmol/L (ref 135–146)
Total Bilirubin: 0.9 mg/dL (ref 0.2–1.2)
Total Protein: 6.4 g/dL (ref 6.1–8.1)

## 2021-02-05 LAB — HEMOGLOBIN A1C
Hgb A1c MFr Bld: 5.5 % of total Hgb (ref <5.7)
Mean Plasma Glucose: 111 mg/dL
eAG (mmol/L): 6.2 mmol/L

## 2021-02-05 MED ORDER — ALBUTEROL SULFATE HFA 108 (90 BASE) MCG/ACT IN AERS: 2.0000 | INHALATION_SPRAY | Freq: Four times a day (QID) | RESPIRATORY_TRACT | 2 refills | Status: DC | PRN

## 2021-02-05 MED ORDER — FLUTICASONE PROPIONATE 50 MCG/ACT NA SUSP: 2.0000 | Freq: Every day | NASAL | 6 refills | Status: DC

## 2021-02-05 NOTE — Progress Notes (Signed)
Subjective:     [name] is a 65 y.o. female and is here for a comprehensive physical exam. The patient reports no problems.  She is doing well overall she actually had a sleeve gastrectomy done in August 2019.  She says overall she has done well but recently has been getting into some sweets and carbonated drinks but knows that she needs to get back on track.  She originally had the surgery done because she was hoping to have knee replacements.  She is now trying to wait until 65 to have that done.  Social History   Socioeconomic History  . Marital status: Married    Spouse name: Not on file  . Number of children: Not on file  . Years of education: Not on file  . Highest education level: Not on file  Occupational History  . Not on file  Tobacco Use  . Smoking status: Never Smoker  . Smokeless tobacco: Never Used  Substance and Sexual Activity  . Alcohol use: No    Alcohol/week: 0.0 standard drinks  . Drug use: No  . Sexual activity: Yes  Other Topics Concern  . Not on file  Social History Narrative  . Not on file   Social Determinants of Health   Financial Resource Strain: Not on file  Food Insecurity: Not on file  Transportation Needs: Not on file  Physical Activity: Not on file  Stress: Not on file  Social Connections: Not on file  Intimate Partner Violence: Not on file   Health Maintenance  Topic Date Due  . COLONOSCOPY (Pts 45-43yrs Insurance coverage will need to be confirmed)  10/01/2020  . COVID-19 Vaccine (4 - Booster for Pfizer series) 04/12/2021  . INFLUENZA VACCINE  06/03/2021  . PAP SMEAR-Modifier  08/01/2021  . MAMMOGRAM  06/02/2022  . TETANUS/TDAP  08/02/2024  . Hepatitis C Screening  Completed  . HIV Screening  Completed  . HPV VACCINES  Aged Out    The following portions of the patient's history were reviewed and updated as appropriate: allergies, current medications, past family history, past medical history, past social history, past surgical  history and problem list.  Review of Systems A comprehensive review of systems was negative.   Objective:    BP 123/61   Pulse 79   Ht 5' (1.524 m)   Wt 224 lb (101.6 kg)   SpO2 100%   BMI 43.75 kg/m  General appearance: alert, cooperative and appears stated age Head: Normocephalic, without obvious abnormality, atraumatic Eyes: conj clear, EOMI, PEERLA Ears: normal TM's and external ear canals both ears Nose: Nares normal. Septum midline. Mucosa normal. No drainage or sinus tenderness. Throat: lips, mucosa, and tongue normal; teeth and gums normal Neck: no adenopathy, no carotid bruit, no JVD, supple, symmetrical, trachea midline and thyroid not enlarged, symmetric, no tenderness/mass/nodules Back: symmetric, no curvature. ROM normal. No CVA tenderness. Lungs: clear to auscultation bilaterally Breasts: normal appearance, no masses or tenderness Heart: regular rate and rhythm, S1, S2 normal, no murmur, click, rub or gallop Abdomen: soft, non-tender; bowel sounds normal; no masses,  no organomegaly Pelvic: cervix normal in appearance, external genitalia normal, no adnexal masses or tenderness, no cervical motion tenderness, rectovaginal septum normal, uterus normal size, shape, and consistency and vagina normal without discharge Extremities: extremities normal, atraumatic, no cyanosis or edema Pulses: 2+ and symmetric Skin: Skin color, texture, turgor normal. No rashes or lesions Lymph nodes: Cervical, supraclavicular, and axillary nodes normal. Neurologic: Alert and oriented X 3, normal  strength and tone. Normal symmetric reflexes. Normal coordination and gait    Assessment:    Healthy female exam.      Plan:     See After Visit Summary for Counseling Recommendations   Keep up a regular exercise program and make sure you are eating a healthy diet Try to eat 4 servings of dairy a day, or if you are lactose intolerant take a calcium with vitamin D daily.  Your vaccines are up  to date.   She is overdue for repeat colonoscopy.    She would like a refill on her albuterol and Flonase as well.  Is post gastric sleeve-I did encourage her to get back on her daily multivitamin she admits she has not been taking it consistently lately.

## 2021-02-05 NOTE — Assessment & Plan Note (Signed)
LDL overall looks good though still little bit above goal just continue to work on healthy diet and regular exercise.

## 2021-02-06 LAB — CYTOLOGY - PAP
Comment: NEGATIVE
Diagnosis: NEGATIVE
High risk HPV: NEGATIVE

## 2021-02-06 NOTE — Progress Notes (Signed)
Call patient: Your Pap smear is normal. Repeat in 5 years.

## 2021-04-19 ENCOUNTER — Other Ambulatory Visit: Payer: Self-pay

## 2021-04-19 ENCOUNTER — Ambulatory Visit (AMBULATORY_SURGERY_CENTER): Payer: 59 | Admitting: *Deleted

## 2021-04-19 VITALS — Ht 60.0 in | Wt 224.0 lb

## 2021-04-19 DIAGNOSIS — Z85038 Personal history of other malignant neoplasm of large intestine: Secondary | ICD-10-CM

## 2021-04-19 MED ORDER — NA SULFATE-K SULFATE-MG SULF 17.5-3.13-1.6 GM/177ML PO SOLN: ORAL | 0 refills | Status: DC

## 2021-04-19 NOTE — Progress Notes (Signed)
Patient's pre-visit was done today over the phone with the patient due to COVID-19 pandemic. Name,DOB and address verified. Insurance verified. Patient denies any allergies to Eggs and Soy. Patient denies any problems with anesthesia/sedation. Patient denies taking diet pills or blood thinners. Packet of Prep instructions mailed to patient including a copy of a consent form-pt is aware. Patient understands to call us back with any questions or concerns. Patient is aware of our care-partner policy and [id number] safety protocol. The patient is COVID-19 vaccinated, per patient.

## 2021-05-03 ENCOUNTER — Emergency Department (INDEPENDENT_AMBULATORY_CARE_PROVIDER_SITE_OTHER): Payer: 59

## 2021-05-03 ENCOUNTER — Other Ambulatory Visit: Payer: Self-pay

## 2021-05-03 ENCOUNTER — Emergency Department (INDEPENDENT_AMBULATORY_CARE_PROVIDER_SITE_OTHER)
Admission: EM | Admit: 2021-05-03 | Discharge: 2021-05-03 | Disposition: A | Discharge status: Home / Self Care | Payer: 59 | Source: Home / Self Care | Attending: Family Medicine | Admitting: Family Medicine

## 2021-05-03 ENCOUNTER — Encounter: Payer: Self-pay | Admitting: Emergency Medicine

## 2021-05-03 DIAGNOSIS — S99911A Unspecified injury of right ankle, initial encounter: Secondary | ICD-10-CM | POA: Diagnosis not present

## 2021-05-03 DIAGNOSIS — S93401A Sprain of unspecified ligament of right ankle, initial encounter: Secondary | ICD-10-CM

## 2021-05-03 MED ORDER — HYDROCODONE-ACETAMINOPHEN 5-325 MG PO TABS: ORAL_TABLET | ORAL | 0 refills | Status: DC

## 2021-05-03 NOTE — Discharge Instructions (Addendum)
Apply ice pack for 30 minutes 2 to 3 times daily until swelling decreases.  Elevate when possible. Wear Ace wrap until swelling decreases.  Wear cam walker boot for about 2 to 3 weeks.  Begin range of motion and stretching exercises in about 5 days as per instruction sheet.  May take Tylenol daytime as needed for pain.

## 2021-05-03 NOTE — ED Triage Notes (Signed)
Patient states that she stepped wrong getting out of her bed a week ago and having some right ankle pain.  Patient states that she is having swelling in her ankle.  Patient also has knee issues which is cause her to shift her weight off her ankle and cause the knee to swell as well.  Patient has taken Tylenol extra strength.

## 2021-05-03 NOTE — ED Provider Notes (Signed)
[name]    CSN: [id number] Arrival date & time: 05/03/21  1818      History   Chief Complaint Chief Complaint  Patient presents with   Ankle Pain    HPI [name] is a 65 y.o. female.   While climbing out of bed one week ago, patient twisted her right ankle.  She has had persistent swelling/pain in her ankle with weight bearing.  The history is provided by the patient.  Ankle Pain Location:  Ankle Time since incident:  1 week Injury: yes   Mechanism of injury comment:  Inverted ankle Ankle location:  R ankle Pain details:    Quality:  Aching   Radiates to:  Does not radiate   Severity:  Moderate   Onset quality:  Sudden   Duration:  1 week   Timing:  Constant   Progression:  Unchanged Chronicity:  New Prior injury to area:  No Relieved by:  Nothing Worsened by:  Bearing weight Ineffective treatments:  Compression and acetaminophen Associated symptoms: decreased ROM, stiffness and swelling   Associated symptoms: no numbness and no tingling    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   Blood transfusion without reported diagnosis    1980's   Colon cancer ([place]) 2009   colon   GERD (gastroesophageal reflux disease)    Hiatal hernia    Hypertension     Patient Active Problem List   Diagnosis Date Noted   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   Sebaceous cyst 11/02/2019   OSA (obstructive sleep apnea) 03/22/2018   Trochanteric bursitis of right hip 05/14/2017   Morbid obesity ([place]) 08/01/2016   IFG (impaired fasting glucose) 07/14/2016   Primary osteoarthritis of both hands 08/03/2015   Elevated LDL cholesterol level 09/16/2013   Trigger thumb of left hand 09/16/2013   Knee osteoarthritis 09/16/2013   HTN (hypertension), benign 09/16/2013   IDA (iron deficiency anemia) 09/16/2013   Asthma 09/16/2013   Unspecified vitamin D  deficiency 09/16/2013   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    Past Surgical History:  Procedure Laterality Date   [place]   COLONOSCOPY     COLONOSCOPY WITH PROPOFOL N/A 10/02/2015   Procedure: COLONOSCOPY WITH PROPOFOL;  Surgeon: Ladene Artist, MD;  Location: [facility];  Service: Endoscopy;  Laterality: N/A;   laparoscopic sleeve gastrectomy N/A    Surgeon- Laurette Schimke Teppara   PARTIAL COLECTOMY  2009   POLYPECTOMY     TUBAL LIGATION  1989    OB History     Gravida  3   Para  3   Term  3   Preterm      AB      Living         SAB      IAB      Ectopic      Multiple      Live Births  3            Home Medications    Prior to Admission medications   Medication Sig Start Date End Date Taking? Authorizing Provider  cetirizine (ZYRTEC) 10 MG tablet Take 10 mg by mouth daily.    Yes [provider]  Cholecalciferol (VITAMIN D3 PO) Take by mouth.  Yes [provider]  fluticasone (FLONASE) 50 MCG/ACT nasal spray Place 2 sprays into both nostrils daily. 02/05/21  Yes Hali Marry, MD  HYDROcodone-acetaminophen (NORCO/VICODIN) 5-325 MG tablet Take one by mouth at bedtime as needed for pain 05/03/21  Yes Kandra Nicolas, MD  methylPREDNISolone (MEDROL DOSEPAK) 4 MG TBPK tablet TAD 05/04/21   Raylene Everts, MD  Multiple Vitamin (MULTIVITAMIN) tablet Take 1 tablet by mouth daily.   Yes [provider]  Na Sulfate-K Sulfate-Mg Sulf 17.5-3.13-1.6 GM/177ML SOLN Suprep (no substitutions)-TAKE AS DIRECTED. 04/19/21  Yes Ladene Artist, MD  albuterol (PROVENTIL) (2.5 MG/3ML) 0.083% nebulizer solution Take 3 mLs (2.5 mg total) by nebulization every 4 (four) hours as needed for wheezing or shortness of breath (please include nebulizer machine, hoses, and mask if needed.). Patient not taking: No sig reported 10/31/19   Hali Marry, MD  albuterol (VENTOLIN  HFA) 108 (90 Base) MCG/ACT inhaler Inhale 2 puffs into the lungs every 6 (six) hours as needed for wheezing or shortness of breath. Patient not taking: No sig reported 02/05/21   Hali Marry, MD    Family History Family History  Problem Relation Age of Onset   Hypertension Father    Stroke Paternal Grandmother    Hypertension Paternal Grandfather    Colon cancer Neg Hx    Stomach cancer Neg Hx    Esophageal cancer Neg Hx     Social History Social History   Tobacco Use   Smoking status: Never   Smokeless tobacco: Never  Vaping Use   Vaping Use: Never used  Substance Use Topics   Alcohol use: No    Alcohol/week: 0.0 standard drinks   Drug use: No     Allergies   Augmentin [amoxicillin-pot clavulanate] and Phentermine   Review of Systems Review of Systems  Musculoskeletal:  Positive for joint swelling and stiffness.  Skin:  Negative for color change.  All other systems reviewed and are negative.   Physical Exam Triage Vital Signs ED Triage Vitals  Enc Vitals Group     BP 05/03/21 1842 135/84     Pulse Rate 05/03/21 1842 81     Resp --      Temp --      Temp src --      SpO2 05/03/21 1842 99 %     Weight --      Height --      Head Circumference --      Peak Flow --      Pain Score 05/03/21 1844 5     Pain Loc --      Pain Edu? --      Excl. in [place]? --    No data found.  Updated Vital Signs BP 135/84 (BP Location: Right Arm)   Pulse 81   SpO2 99%   Visual Acuity Right Eye Distance:   Left Eye Distance:   Bilateral Distance:    Right Eye Near:   Left Eye Near:    Bilateral Near:     Physical Exam Vitals and nursing note reviewed.  Constitutional:      General: She is not in acute distress. HENT:     Head: Normocephalic.  Eyes:     Pupils: Pupils are equal, round, and reactive to light.  Cardiovascular:     Rate and Rhythm: Normal rate.  Pulmonary:     Effort: Pulmonary effort is normal.  Musculoskeletal:        General:  Swelling  present. No deformity.     Right ankle: Swelling present. No deformity, ecchymosis or lacerations. Tenderness present over the medial malleolus. No lateral malleolus tenderness. Decreased range of motion.     Right Achilles Tendon: Normal.       Feet:     Comments: Right ankle:  Decreased range of motion.  Tenderness and swelling over the medial malleolus.  Joint stable.  No tenderness over the base of the fifth metatarsal.  Distal neurovascular function is intact.   Skin:    General: Skin is warm and dry.  Neurological:     Mental Status: She is alert and oriented to person, place, and time.     UC Treatments / Results  Labs (all labs ordered are listed, but only abnormal results are displayed) Labs Reviewed - No data to display  EKG   Radiology DG Ankle Complete Right  Result Date: 05/03/2021 CLINICAL DATA:  Twisted right ankle 1 week ago with tenderness over the medial malleolus. Difficulty ambulating. EXAM: RIGHT ANKLE - COMPLETE 3+ VIEW COMPARISON:  None. FINDINGS: Degenerative changes in the right ankle. No acute fracture or dislocation. No focal bone lesion or bone destruction. Prominent plantar calcaneal spur. Soft tissues are unremarkable. IMPRESSION: Degenerative changes in the right ankle. No acute bony abnormalities. Electronically Signed   By: Lucienne Capers M.D.   On: 05/03/2021 20:02    Procedures Procedures (including critical care time)  Medications Ordered in UC Medications - No data to display  Initial Impression / Assessment and Plan / UC Course  I have reviewed the triage vital signs and the nursing notes.  Pertinent labs & imaging results that were available during my care of the patient were reviewed by me and considered in my medical decision making (see chart for details).    Ace wrap applied.  Dispensed cam walker. Rx for Vicodin at bedtime (#5, no refill). Controlled Substance Prescriptions I have consulted the [place] Controlled Substances  Registry for this patient, and feel the risk/benefit ratio today is favorable for proceeding with this prescription for a controlled substance.  Followup with Dr. Aundria Mems ([facility]) if not improving about two weeks.   Final Clinical Impressions(s) / UC Diagnoses   Final diagnoses:  Sprain of right ankle, unspecified ligament, initial encounter     Discharge Instructions      Apply ice pack for 30 minutes 2 to 3 times daily until swelling decreases.  Elevate when possible. Wear Ace wrap until swelling decreases.  Wear cam walker boot for about 2 to 3 weeks.  Begin range of motion and stretching exercises in about 5 days as per instruction sheet.  May take Tylenol daytime as needed for pain.      ED Prescriptions     Medication Sig Dispense Auth. Provider   HYDROcodone-acetaminophen (NORCO/VICODIN) 5-325 MG tablet Take one by mouth at bedtime as needed for pain 5 tablet Kandra Nicolas, MD         Kandra Nicolas, MD 05/04/21 1250

## 2021-05-04 ENCOUNTER — Telehealth: Payer: Self-pay

## 2021-05-04 MED ORDER — METHYLPREDNISOLONE 4 MG PO TBPK: ORAL_TABLET | ORAL | 0 refills | Status: DC

## 2021-05-04 NOTE — Telephone Encounter (Signed)
TC from pt who reports that she did not have a prednisone Rx as promised by Dr. Assunta Found yesterday, only Vicodin Rx. She reports that he told her he was going to call in a steroid for her knee pain/swelling. Discussed w/ Dr. Kathrin Ruddy order for Medrol dose pack received. Informed pt that this Rx would be sent to her pharmacy. She verbalizes understanding.

## 2021-05-10 ENCOUNTER — Ambulatory Visit (AMBULATORY_SURGERY_CENTER): Payer: 59 | Admitting: Gastroenterology

## 2021-05-10 ENCOUNTER — Encounter: Payer: Self-pay | Admitting: Gastroenterology

## 2021-05-10 ENCOUNTER — Other Ambulatory Visit: Payer: Self-pay

## 2021-05-10 VITALS — BP 146/86 | HR 77 | Temp 98.0°F | Resp 20 | Ht 60.0 in | Wt 224.0 lb

## 2021-05-10 DIAGNOSIS — Z85038 Personal history of other malignant neoplasm of large intestine: Secondary | ICD-10-CM | POA: Diagnosis not present

## 2021-05-10 MED ORDER — SODIUM CHLORIDE 0.9 % IV SOLN
500.0000 mL | Freq: Once | INTRAVENOUS | Status: DC
Start: 2021-05-10 — End: 2021-05-10

## 2021-05-10 NOTE — Progress Notes (Signed)
Pt's states no medical or surgical changes since previsit or office visit. 

## 2021-05-10 NOTE — Progress Notes (Signed)
pt tolerated well. VSS. awake and to recovery. Report given to RN.  

## 2021-05-10 NOTE — Op Note (Signed)
[facility] Patient Name: [name] Procedure Date: 05/10/2021 7:59 AM MRN: [id number] Endoscopist: Ladene Artist , MD Age: 65 Referring MD:  Date of Birth: 1956-11-01 Gender: Female Account #: [id number] Procedure:                Colonoscopy Indications:              High risk colon cancer surveillance: Personal                            history of colon cancer Medicines:                Monitored Anesthesia Care Procedure:                Pre-Anesthesia Assessment:                           - Prior to the procedure, a History and Physical                            was performed, and patient medications and                            allergies were reviewed. The patient's tolerance of                            previous anesthesia was also reviewed. The risks                            and benefits of the procedure and the sedation                            options and risks were discussed with the patient.                            All questions were answered, and informed consent                            was obtained. Prior Anticoagulants: The patient has                            taken no previous anticoagulant or antiplatelet                            agents. ASA Grade Assessment: III - A patient with                            severe systemic disease. After reviewing the risks                            and benefits, the patient was deemed in                            satisfactory condition to undergo the procedure.  After obtaining informed consent, the colonoscope                            was passed under direct vision. Throughout the                            procedure, the patient's blood pressure, pulse, and                            oxygen saturations were monitored continuously. The                            CF [id number] #[id number] was introduced through the anus                            and advanced to the the cecum,  identified by                            appendiceal orifice and ileocecal valve. The                            ileocecal valve, appendiceal orifice, and rectum                            were photographed. The quality of the bowel                            preparation was excellent. The colonoscopy was                            somewhat difficult due to restricted mobility of                            the transverse colon. Abdomen was carefully                            examined for a possible hernia and none found.                            Adhesions involving the transverse colon were                            suspected Successful completion of the procedure                            was aided by straightening and shortening the scope                            to obtain bowel loop reduction, using scope torsion                            and applying abdominal pressure. The patient  tolerated the procedure well. Scope In: 8:05:52 AM Scope Out: 8:30:13 AM Scope Withdrawal Time: 0 hours 5 minutes 32 seconds  Total Procedure Duration: 0 hours 24 minutes 21 seconds  Findings:                 The perianal and digital rectal examinations were                            normal.                           There was evidence of a prior end-to-end                            colo-colonic anastomosis in the transverse colon.                            This was patent and was characterized by healthy                            appearing mucosa. The anastomosis was traversed.                           Multiple medium-mouthed diverticula were found in                            the left colon.                           The exam was otherwise without abnormality on                            direct and retroflexion views. Complications:            No immediate complications. Estimated blood loss:                            None. Estimated Blood Loss:     Estimated  blood loss: none. Impression:               - Patent end-to-end colo-colonic anastomosis,                            characterized by healthy appearing mucosa.                           - Mild diverticulosis in the left colon.                           - The examination was otherwise normal on direct                            and retroflexion views.                           - No specimens collected. Recommendation:           - Repeat colonoscopy in 5 years for surveillance.                           -  Patient has a contact number available for                            emergencies. The signs and symptoms of potential                            delayed complications were discussed with the                            patient. Return to normal activities tomorrow.                            Written discharge instructions were provided to the                            patient.                           - Resume previous diet.                           - Continue present medications. Ladene Artist, MD 05/10/2021 8:36:59 AM This report has been signed electronically.

## 2021-05-10 NOTE — Patient Instructions (Signed)
YOU HAD AN ENDOSCOPIC PROCEDURE TODAY AT THE [facility]:   Refer to the procedure report that was given to you for any specific questions about what was found during the examination.  If the procedure report does not answer your questions, please call your gastroenterologist to clarify.  If you requested that your care partner not be given the details of your procedure findings, then the procedure report has been included in a sealed envelope for you to review at your convenience later.  YOU SHOULD EXPECT: Some feelings of bloating in the abdomen. Passage of more gas than usual.  Walking can help get rid of the air that was put into your GI tract during the procedure and reduce the bloating. If you had a lower endoscopy (such as a colonoscopy or flexible sigmoidoscopy) you may notice spotting of blood in your stool or on the toilet paper. If you underwent a bowel prep for your procedure, you may not have a normal bowel movement for a few days.  Please Note:  You might notice some irritation and congestion in your nose or some drainage.  This is from the oxygen used during your procedure.  There is no need for concern and it should clear up in a day or so.  SYMPTOMS TO REPORT IMMEDIATELY:   Following lower endoscopy (colonoscopy or flexible sigmoidoscopy):  Excessive amounts of blood in the stool  Significant tenderness or worsening of abdominal pains  Swelling of the abdomen that is new, acute  Fever of 100F or higher   Following upper endoscopy (EGD)  Vomiting of blood or coffee ground material  New chest pain or pain under the shoulder blades  Painful or persistently difficult swallowing  New shortness of breath  Fever of 100F or higher  Black, tarry-looking stools  For urgent or emergent issues, a gastroenterologist can be reached at any hour by calling [phone]. Do not use MyChart messaging for urgent concerns.    DIET:  We do recommend a small meal at first, but  then you may proceed to your regular diet.  Drink plenty of fluids but you should avoid alcoholic beverages for 24 hours.  ACTIVITY:  You should plan to take it easy for the rest of today and you should NOT DRIVE or use heavy machinery until tomorrow (because of the sedation medicines used during the test).    FOLLOW UP: Our staff will call the number listed on your records 48-72 hours following your procedure to check on you and address any questions or concerns that you may have regarding the information given to you following your procedure. If we do not reach you, we will leave a message.  We will attempt to reach you two times.  During this call, we will ask if you have developed any symptoms of COVID 19. If you develop any symptoms (ie: fever, flu-like symptoms, shortness of breath, cough etc.) before then, please call [phone].  If you test positive for Covid 19 in the 2 weeks post procedure, please call and report this information to us.    If any biopsies were taken you will be contacted by phone or by letter within the next 1-3 weeks.  Please call us at [phone] if you have not heard about the biopsies in 3 weeks.    SIGNATURES/CONFIDENTIALITY: You and/or your care partner have signed paperwork which will be entered into your electronic medical record.  These signatures attest to the fact that that the information above on   your After Visit Summary has been reviewed and is understood.  Full responsibility of the confidentiality of this discharge information lies with you and/or your care-partner. 

## 2021-05-14 ENCOUNTER — Telehealth: Payer: Self-pay

## 2021-05-14 NOTE — Telephone Encounter (Signed)
  Follow up Call-  Call back number 05/10/2021  Post procedure Call Back phone  # [phone]  Permission to leave phone message Yes  Some recent data might be hidden     Patient questions:  Do you have a fever, pain , or abdominal swelling? No. Pain Score  0 *  Have you tolerated food without any problems? Yes.    Have you been able to return to your normal activities? Yes.    Do you have any questions about your discharge instructions: Diet   No. Medications  No. Follow up visit  No.  Do you have questions or concerns about your Care? No.  Actions: * If pain score is 4 or above: No action needed, pain <4.   Have you developed a fever since your procedure? No   2.   Have you had an respiratory symptoms (SOB or cough) since your procedure? No   3.   Have you tested positive for COVID 19 since your procedure no   4.   Have you had any family members/close contacts diagnosed with the COVID 19 since your procedure?  No    If yes to any of these questions please route to Joylene John, RN and Joella Prince, RN

## 2021-06-08 LAB — HM MAMMOGRAPHY

## 2021-06-14 ENCOUNTER — Encounter: Payer: Self-pay | Admitting: Family Medicine

## 2021-10-07 ENCOUNTER — Ambulatory Visit (INDEPENDENT_AMBULATORY_CARE_PROVIDER_SITE_OTHER): Payer: 59 | Admitting: Family Medicine

## 2021-10-07 ENCOUNTER — Other Ambulatory Visit: Payer: Self-pay

## 2021-10-07 ENCOUNTER — Encounter: Payer: Self-pay | Admitting: Family Medicine

## 2021-10-07 VITALS — BP 128/82 | HR 84 | Ht 60.0 in | Wt 228.0 lb

## 2021-10-07 DIAGNOSIS — Z23 Encounter for immunization: Secondary | ICD-10-CM

## 2021-10-07 DIAGNOSIS — R21 Rash and other nonspecific skin eruption: Secondary | ICD-10-CM

## 2021-10-07 DIAGNOSIS — J4540 Moderate persistent asthma, uncomplicated: Secondary | ICD-10-CM | POA: Diagnosis not present

## 2021-10-07 DIAGNOSIS — M1711 Unilateral primary osteoarthritis, right knee: Secondary | ICD-10-CM

## 2021-10-07 MED ORDER — ALBUTEROL SULFATE (2.5 MG/3ML) 0.083% IN NEBU: 2.5000 mg | INHALATION_SOLUTION | RESPIRATORY_TRACT | 2 refills | Status: DC | PRN

## 2021-10-07 MED ORDER — PREDNISONE 20 MG PO TABS: 40.0000 mg | ORAL_TABLET | Freq: Every day | ORAL | 0 refills | Status: DC

## 2021-10-07 MED ORDER — ALBUTEROL SULFATE HFA 108 (90 BASE) MCG/ACT IN AERS: 2.0000 | INHALATION_SPRAY | Freq: Four times a day (QID) | RESPIRATORY_TRACT | 2 refills | Status: DC | PRN

## 2021-10-07 MED ORDER — CLOTRIMAZOLE-BETAMETHASONE 1-0.05 % EX CREA: 1.0000 "application " | TOPICAL_CREAM | Freq: Two times a day (BID) | CUTANEOUS | 0 refills | Status: DC

## 2021-10-07 MED ORDER — FLUTICASONE PROPIONATE 50 MCG/ACT NA SUSP: 2.0000 | Freq: Every day | NASAL | 6 refills | Status: DC

## 2021-10-07 NOTE — Progress Notes (Signed)
Established Patient Office Visit  Subjective:  Patient ID: [name], female    DOB: 10/17/1956  Age: 65 y.o. MRN: [id number]  CC:  Chief Complaint  Patient presents with   Rash    HPI [name] presents for   Bilateral knee pain -she recently saw the orthopedist at [facility] on November 14 for bilateral knee pain.  She is having difficulty with ambulation.  They have recommended total knee replacement for her.  But her current BMI is greater than 40.  They want her down to goal of 204 pounds preoperatively.  X-ray at the orthopedic office showed severe medial, moderate lateral and severe patellofemoral compartment degenerative changes.  They have scheduled right knee replacement in February.  He would like something for pain she has had gastric bypass so cannot use NSAIDs.  She says Tylenol is really just not cutting it.  Tramadol caused severe constipation.  She would like to do a round of prednisone if possible she knows she cannot get a steroid shot in the joint if she is can have surgery.  Rash on her abdomen after using some bath and body product.  Starting using an antifungal cream and feels it has gotten some better.  She says she still has a few spots.  It was itchy.  Doing okay with her asthma but no recent flares.  She does need refills today.  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   Blood transfusion without reported diagnosis    1980's   Colon cancer ([place]) 2009   colon   GERD (gastroesophageal reflux disease)    Hiatal hernia    Hypertension     Past Surgical History:  Procedure Laterality Date   [place] WITH PROPOFOL N/A 10/02/2015   Procedure: COLONOSCOPY WITH PROPOFOL;  Surgeon: Ladene Artist, MD;  Location: [facility];  Service: Endoscopy;  Laterality: N/A;    laparoscopic sleeve gastrectomy N/A    Surgeon- Laurette Schimke Teppara   PARTIAL COLECTOMY  2009   POLYPECTOMY     TUBAL LIGATION  1989    Family History  Problem Relation Age of Onset   Hypertension Father    Stroke Paternal Grandmother    Hypertension Paternal Grandfather    Colon cancer Neg Hx    Stomach cancer Neg Hx    Esophageal cancer Neg Hx     Social History   Socioeconomic History   Marital status: Married    Spouse name: Not on file   Number of children: Not on file   Years of education: Not on file   Highest education level: Not on file  Occupational History   Not on file  Tobacco Use   Smoking status: Never   Smokeless tobacco: Never  Vaping Use   Vaping Use: Never used  Substance and Sexual Activity   Alcohol use: No    Alcohol/week: 0.0 standard drinks   Drug use: No   Sexual activity: Yes  Other Topics Concern   Not on file  Social History Narrative   Not on file   Social Determinants of Health   Financial Resource Strain: Not on file  Food Insecurity: Not on file  Transportation Needs: Not on file  Physical Activity: Not on file  Stress: Not on file  Social Connections: Not on file  Intimate Partner Violence: Not on file    Outpatient Medications Prior to Visit  Medication Sig Dispense Refill   cetirizine (ZYRTEC) 10 MG tablet Take 10 mg by mouth daily.      Cholecalciferol (VITAMIN D3 PO) Take by mouth.     Multiple Vitamin (MULTIVITAMIN) tablet Take 1 tablet by mouth daily.     albuterol (PROVENTIL) (2.5 MG/3ML) 0.083% nebulizer solution Take 3 mLs (2.5 mg total) by nebulization every 4 (four) hours as needed for wheezing or shortness of breath (please include nebulizer machine, hoses, and mask if needed.). (Patient not taking: No sig reported) 30 mL 2   albuterol (VENTOLIN HFA) 108 (90 Base) MCG/ACT inhaler Inhale 2 puffs into the lungs every 6 (six) hours as needed for wheezing or shortness of breath. (Patient not taking: No sig reported) 18  g 2   fluticasone (FLONASE) 50 MCG/ACT nasal spray Place 2 sprays into both nostrils daily. 16 g 6   HYDROcodone-acetaminophen (NORCO/VICODIN) 5-325 MG tablet Take one by mouth at bedtime as needed for pain 5 tablet 0   methylPREDNISolone (MEDROL DOSEPAK) 4 MG TBPK tablet TAD 1 each 0   No facility-administered medications prior to visit.    Allergies  Allergen Reactions   Augmentin [Amoxicillin-Pot Clavulanate] Diarrhea   Phentermine     Numbness tingling/feels weird.     ROS Review of Systems    Objective:    Physical Exam Constitutional:      Appearance: Normal appearance. She is well-developed.  HENT:     Head: Normocephalic and atraumatic.  Cardiovascular:     Rate and Rhythm: Normal rate and regular rhythm.     Heart sounds: Normal heart sounds.  Pulmonary:     Effort: Pulmonary effort is normal.     Breath sounds: Normal breath sounds.  Skin:    General: Skin is warm and dry.  Neurological:     Mental Status: She is alert and oriented to person, place, and time.  Psychiatric:        Behavior: Behavior normal.    BP 128/82   Pulse 84   Ht 5' (1.524 m)   Wt 228 lb (103.4 kg)   SpO2 99%   BMI 44.53 kg/m  Wt Readings from Last 3 Encounters:  10/07/21 228 lb (103.4 kg)  05/10/21 224 lb (101.6 kg)  04/19/21 224 lb (101.6 kg)     Health Maintenance Due  Topic Date Due   Pneumococcal Vaccine 3-71 Years old (3 - PCV) 08/22/2019   COVID-19 Vaccine (4 - Booster for Pfizer series) 12/07/2020    There are no preventive care reminders to display for this patient.  Lab Results  Component Value Date   TSH 0.62 09/20/2018   Lab Results  Component Value Date   WBC 5.5 02/04/2021   HGB 14.7 02/04/2021   HCT 47.1 (H) 02/04/2021   MCV 79.3 (L) 02/04/2021   PLT 244 02/04/2021   Lab Results  Component Value Date   NA 139 02/04/2021   K 4.1 02/04/2021   CO2 30 02/04/2021   GLUCOSE 87 02/04/2021   BUN 9 02/04/2021   CREATININE 0.75 02/04/2021   BILITOT  0.9 02/04/2021   ALKPHOS 98 06/26/2017   AST 15 02/04/2021   ALT 9 02/04/2021   PROT 6.4 02/04/2021   ALBUMIN 3.6 06/26/2017   CALCIUM 9.1 02/04/2021   Lab Results  Component Value Date  CHOL 205 (H) 02/04/2021   Lab Results  Component Value Date   HDL 62 02/04/2021   Lab Results  Component Value Date   LDLCALC 126 (H) 02/04/2021   Lab Results  Component Value Date   TRIG 75 02/04/2021   Lab Results  Component Value Date   CHOLHDL 3.3 02/04/2021   Lab Results  Component Value Date   HGBA1C 5.5 02/04/2021      Assessment & Plan:   Problem List Items Addressed This Visit       Respiratory   Asthma    She feels like overall she is doing well with her asthma no frequent flares or exacerbations but would like a refill on her inhalers.      Relevant Medications   predniSONE (DELTASONE) 20 MG tablet   albuterol (VENTOLIN HFA) 108 (90 Base) MCG/ACT inhaler   albuterol (PROVENTIL) (2.5 MG/3ML) 0.083% nebulizer solution     Musculoskeletal and Integument   Knee osteoarthritis - Primary    She would really like something for pain.  Tramadol causes constipation.  She cannot take NSAIDs because of her bypass surgery and Tylenol just does not seem to be working well.  She would really like to have a round of oral prednisone.  I did send in a prescription but explained that it is really not been to be able to provide pain relief up until her surgery as she will not be able to take it near her surgery.  I am worried about potential for increased weight gain on prednisone.  If she needs more pain relief and encouraged her to follow-up with the orthopedist since she is seeing them.  Surgeon wants her BMI less than 40.  She says that she feels confident that she could really make some changes and get it under control.  When she had to have her gallbladder surgery she lost 25 pounds in 1 month.  She plans on cutting back on soda etc.      Relevant Medications   predniSONE  (DELTASONE) 20 MG tablet   Other Visit Diagnoses     Rash       Relevant Medications   clotrimazole-betamethasone (LOTRISONE) cream   Need for immunization against influenza       Relevant Orders   Flu Vaccine QUAD 69mo+IM (Fluarix, Fluzone & Alfiuria Quad PF) (Completed)       Rash-she was unable to show me any of the remaining rash it seems to be fading but she said she did have a few areas on her lower abdomen.  I did send in a prescription for Lotrisone to help with the redness and itching and then antifungal as that similar to what she is already been using and seems to be helping.    Meds ordered this encounter  Medications   clotrimazole-betamethasone (LOTRISONE) cream    Sig: Apply 1 application topically 2 (two) times daily.    Dispense:  45 g    Refill:  0   predniSONE (DELTASONE) 20 MG tablet    Sig: Take 2 tablets (40 mg total) by mouth daily with breakfast.    Dispense:  10 tablet    Refill:  0   fluticasone (FLONASE) 50 MCG/ACT nasal spray    Sig: Place 2 sprays into both nostrils daily.    Dispense:  16 g    Refill:  6   albuterol (VENTOLIN HFA) 108 (90 Base) MCG/ACT inhaler    Sig: Inhale 2 puffs into the lungs every 6 (  six) hours as needed for wheezing or shortness of breath.    Dispense:  18 g    Refill:  2   albuterol (PROVENTIL) (2.5 MG/3ML) 0.083% nebulizer solution    Sig: Take 3 mLs (2.5 mg total) by nebulization every 4 (four) hours as needed for wheezing or shortness of breath.    Dispense:  30 mL    Refill:  2     Follow-up: Return in about 6 months (around 04/07/2022) for asthma .    Beatrice Lecher, MD

## 2021-10-07 NOTE — Patient Instructions (Signed)
Please schedule your pneumonia vaccine after you turn 65 you can always just call and ask for a nurse visit to do that for the Prevnar 20.

## 2021-10-07 NOTE — Assessment & Plan Note (Signed)
She feels like overall she is doing well with her asthma no frequent flares or exacerbations but would like a refill on her inhalers.

## 2021-10-07 NOTE — Assessment & Plan Note (Addendum)
She would really like something for pain.  Tramadol causes constipation.  She cannot take NSAIDs because of her bypass surgery and Tylenol just does not seem to be working well.  She would really like to have a round of oral prednisone.  I did send in a prescription but explained that it is really not been to be able to provide pain relief up until her surgery as she will not be able to take it near her surgery.  I am worried about potential for increased weight gain on prednisone.  If she needs more pain relief and encouraged her to follow-up with the orthopedist since she is seeing them.  Surgeon wants her BMI less than 40.  She says that she feels confident that she could really make some changes and get it under control.  When she had to have her gallbladder surgery she lost 25 pounds in 1 month.  She plans on cutting back on soda etc.

## 2022-02-06 ENCOUNTER — Encounter: Payer: 59 | Admitting: Family Medicine

## 2022-03-12 ENCOUNTER — Ambulatory Visit (INDEPENDENT_AMBULATORY_CARE_PROVIDER_SITE_OTHER): Payer: Medicare Other | Admitting: Family Medicine

## 2022-03-12 ENCOUNTER — Encounter: Payer: Self-pay | Admitting: Family Medicine

## 2022-03-12 VITALS — BP 128/82 | HR 83 | Ht 60.0 in | Wt 233.0 lb

## 2022-03-12 DIAGNOSIS — J329 Chronic sinusitis, unspecified: Secondary | ICD-10-CM | POA: Diagnosis not present

## 2022-03-12 DIAGNOSIS — Z01818 Encounter for other preprocedural examination: Secondary | ICD-10-CM | POA: Diagnosis not present

## 2022-03-12 DIAGNOSIS — J40 Bronchitis, not specified as acute or chronic: Secondary | ICD-10-CM

## 2022-03-12 MED ORDER — BENZONATATE 200 MG PO CAPS: 200.0000 mg | ORAL_CAPSULE | Freq: Two times a day (BID) | ORAL | 0 refills | Status: DC | PRN

## 2022-03-12 MED ORDER — PREDNISONE 20 MG PO TABS: 40.0000 mg | ORAL_TABLET | Freq: Every day | ORAL | 0 refills | Status: DC

## 2022-03-12 MED ORDER — CEFDINIR 300 MG PO CAPS: 300.0000 mg | ORAL_CAPSULE | Freq: Two times a day (BID) | ORAL | 0 refills | Status: DC

## 2022-03-12 NOTE — Progress Notes (Signed)
? ?Established Patient Office Visit ? ?Subjective   ?Patient ID: [name], female    DOB: 10/30/1956  Age: 66 y.o. MRN: [id number] ? ?Chief Complaint  ?Patient presents with  ? Pre-op Exam  ? ? ?HPI ? ?She is here today for a preoperative physical for knee replacement surgery at [facility].  .  She was evaluated in January but at that time her BMI was greater than 40.  It unfortunately got pushed out.  Her mom was not doing well and her mom did eventually pass and actually had the funeral last week. ? ?She also reports for the last couple of weeks she has had nasal congestion and cough.  She traveled to [place] to visit her daughter and she did an E-visit while there.  She was given a Z-Pak prednisone and Tessalon Perles.  She says it helped a little but her symptoms did not completely resolve.  She also tested positive for COVID 2 weeks ago.  She reports that she is not feeling significantly better today she still has a lot of nasal and chest congestion.  No fevers or chills. ? ?Patient Active Problem List  ? Diagnosis Date Noted  ? 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 Sebaceous cyst 11/02/2019  ? OSA (obstructive sleep apnea) 03/22/2018  ? Trochanteric bursitis of right hip 05/14/2017  ? Morbid obesity ([place]) 08/01/2016  ? IFG (impaired fasting glucose) 07/14/2016  ? Primary osteoarthritis of both hands 08/03/2015  ? Elevated LDL cholesterol level 09/16/2013  ? Trigger thumb of left hand 09/16/2013  ? Knee osteoarthritis 09/16/2013  ? HTN (hypertension), benign 09/16/2013  ? IDA (iron deficiency anemia) 09/16/2013  ? Asthma 09/16/2013  ? Unspecified vitamin D deficiency 09/16/2013  ? 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  ? ?Past Surgical History:  ?Procedure Laterality Date  ? [place]  ? CHOLECYSTECTOMY  1980  ? COLONOSCOPY    ? COLONOSCOPY WITH PROPOFOL N/A 10/02/2015  ? Procedure: COLONOSCOPY WITH PROPOFOL;  Surgeon: Ladene Artist, MD;  Location: [facility]  ENDOSCOPY;  Service: Endoscopy;  Laterality: N/A;  ? laparoscopic sleeve gastrectomy N/A   ? Surgeon- Demetrius Revel  ? PARTIAL COLECTOMY  2009  ? POLYPECTOMY    ? TUBAL LIGATION  1989  ? ?Social History  ? ?Tobacco Use  ? Smoking status: Never  ? Smokeless tobacco: Never  ?Vaping Use  ? Vaping Use: Never used  ?Substance Use Topics  ? Alcohol use: No  ?  Alcohol/week: 0.0 standard drinks  ? Drug use: No  ? ?Allergies  ?Allergen Reactions  ? Augmentin [Amoxicillin-Pot Clavulanate] Diarrhea  ? Phentermine   ?  Numbness tingling/feels weird.   ? ?  ? ?ROS ? ?  ?Objective:  ?  ? ?BP 128/82   Pulse 83   Ht 5' (1.524 m)   Wt 233 lb (105.7 kg)   SpO2 98%   BMI 45.50 kg/m?  ? ? ?Physical Exam ?Vitals and nursing note reviewed.  ?Constitutional:   ?   Appearance: She is well-developed.  ?HENT:  ?   Head: Normocephalic and atraumatic.  ?   Right Ear: External ear normal.  ?   Left Ear: External ear normal.  ?   Nose: Nose normal.  ?Eyes:  ?   Conjunctiva/sclera: Conjunctivae normal.  ?   Pupils: Pupils are equal, round, and reactive to light.  ?Neck:  ?   Thyroid: No thyromegaly.  ?Cardiovascular:  ?   Rate  and Rhythm: Normal rate and regular rhythm.  ?   Heart sounds: Normal heart sounds.  ?Pulmonary:  ?   Effort: Pulmonary effort is normal.  ?   Breath sounds: Normal breath sounds. No wheezing.  ?Abdominal:  ?   General: Bowel sounds are normal.  ?   Palpations: Abdomen is soft.  ?   Tenderness: There is no abdominal tenderness.  ?Musculoskeletal:     ?   General: No swelling.  ?   Cervical back: Neck supple.  ?Lymphadenopathy:  ?   Cervical: No cervical adenopathy.  ?Skin: ?   General: Skin is warm and dry.  ?Neurological:  ?   Mental Status: She is alert and oriented to person, place, and time.  ?Psychiatric:     ?   Behavior: Behavior normal.  ? ? ? ?No results found for any visits on 03/12/22. ? ? ? ?The 10-year ASCVD risk score (Arnett DK, et al., 2019) is: 9.1% ? ?  ?Assessment & Plan:  ? ?Problem List Items  Addressed This Visit   ?None ?Visit Diagnoses   ? ? Pre-op exam    -  Primary  ? Relevant Orders  ? EKG 12-Lead  ? COMPLETE METABOLIC PANEL WITH GFR  ? CBC  ? Hemoglobin A1c  ? INR/PT  ? Preoperative clearance      ? Relevant Orders  ? COMPLETE METABOLIC PANEL WITH GFR  ? CBC  ? Hemoglobin A1c  ? INR/PT  ? Sinobronchitis      ? Relevant Medications  ? predniSONE (DELTASONE) 20 MG tablet  ? benzonatate (TESSALON) 200 MG capsule  ? cefdinir (OMNICEF) 300 MG capsule  ? ?  ? ?Preoperative clearance-clearance is currently pending based on whether or not she can get her BMI down to 40 which would be right around 202 pounds.  She said as soon as she is starting to feel better she will work on exercising and walking and getting some of the weight back off.  We will get up-to-date labs for clearance as well.  And we will call to get a clearance form from the orthopedic office. ?EKG today shows rate of 81 bpm, normal sinus rhythm with no acute ST-T wave changes.  Does meet criteria for LVH.  No change from previous in 2019. ? ?Sinobronchitis-we will treat with cefdinir, prednisone and she would like a refill on the [employer].  If not better in 1 week then please let us know. ? ?No follow-ups on file.  ? ? ?Beatrice Lecher, MD ? ?

## 2022-04-11 ENCOUNTER — Ambulatory Visit: Payer: 59 | Admitting: Family Medicine

## 2022-06-23 LAB — HM MAMMOGRAPHY

## 2022-06-28 ENCOUNTER — Ambulatory Visit (INDEPENDENT_AMBULATORY_CARE_PROVIDER_SITE_OTHER): Payer: Medicare Other

## 2022-06-28 ENCOUNTER — Ambulatory Visit
Admission: EM | Admit: 2022-06-28 | Discharge: 2022-06-28 | Disposition: A | Discharge status: Home / Self Care | Payer: Medicare Other

## 2022-06-28 ENCOUNTER — Other Ambulatory Visit: Payer: Self-pay

## 2022-06-28 DIAGNOSIS — M25561 Pain in right knee: Secondary | ICD-10-CM

## 2022-06-28 MED ORDER — HYDROCODONE-ACETAMINOPHEN 5-325 MG PO TABS: 1.0000 | ORAL_TABLET | Freq: Three times a day (TID) | ORAL | 0 refills | Status: DC | PRN

## 2022-06-28 MED ORDER — METHOCARBAMOL 500 MG PO TABS: 500.0000 mg | ORAL_TABLET | Freq: Three times a day (TID) | ORAL | 0 refills | Status: DC | PRN

## 2022-06-28 MED ORDER — PREDNISONE 10 MG (21) PO TBPK: ORAL_TABLET | Freq: Every day | ORAL | 0 refills | Status: DC

## 2022-06-28 NOTE — ED Provider Notes (Signed)
[name]    CSN: [id number] Arrival date & time: 06/28/22  4098      History   Chief Complaint Chief Complaint  Patient presents with   Knee Pain    HPI [name] is a 66 y.o. female.   HPI 66 year old female presents with acute on chronic right knee pain since yesterday morning.  PMH significant for colon cancer, osteoarthritis of knees, morbid obesity, and HTN.   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   Sebaceous cyst 11/02/2019   OSA (obstructive sleep apnea) 03/22/2018   Trochanteric bursitis of right hip 05/14/2017   Morbid obesity ([place]) 08/01/2016   IFG (impaired fasting glucose) 07/14/2016   Primary osteoarthritis of both hands 08/03/2015   Elevated LDL cholesterol level 09/16/2013   Trigger thumb of left hand 09/16/2013   Knee osteoarthritis 09/16/2013   HTN (hypertension), benign 09/16/2013   IDA (iron deficiency anemia) 09/16/2013   Asthma 09/16/2013   Unspecified vitamin D deficiency 09/16/2013   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    Past Surgical History:  Procedure Laterality Date   [place]   COLONOSCOPY     COLONOSCOPY WITH PROPOFOL N/A 10/02/2015   Procedure: COLONOSCOPY WITH PROPOFOL;  Surgeon: Ladene Artist, MD;  Location: [facility];  Service: Endoscopy;  Laterality: N/A;   laparoscopic sleeve gastrectomy N/A    Surgeon- Laurette Schimke Teppara   PARTIAL COLECTOMY  2009   POLYPECTOMY     TUBAL LIGATION  1989    OB History     Gravida  3   Para  3   Term  3   Preterm      AB       Living         SAB      IAB      Ectopic      Multiple      Live Births  3            Home Medications    Prior to Admission medications   Medication Sig Start Date End Date Taking? Authorizing Provider  acetaminophen (TYLENOL) 500 MG tablet Take 500 mg by mouth every 6 (six) hours as needed.   Yes [provider]  albuterol (PROVENTIL) (2.5 MG/3ML) 0.083% nebulizer solution Take 3 mLs (2.5 mg total) by nebulization every 4 (four) hours as needed for wheezing or shortness of breath. 10/07/21   Hali Marry, MD  albuterol (VENTOLIN HFA) 108 (90 Base) MCG/ACT inhaler Inhale 2 puffs into the lungs every 6 (six) hours as needed for wheezing or shortness of breath. 10/07/21   Hali Marry, MD  cetirizine (ZYRTEC) 10 MG tablet Take 10 mg by mouth daily.     [provider]  Cholecalciferol (VITAMIN D3 PO) Take by mouth.    [provider]  fluticasone (FLONASE) 50 MCG/ACT nasal spray Place 2 sprays into both nostrils daily. 10/07/21   Hali Marry,  MD  HYDROcodone-acetaminophen (NORCO/VICODIN) 5-325 MG tablet Take 1 tablet by mouth every 8 (eight) hours as needed. 06/28/22  Yes Eliezer Lofts, FNP  methocarbamol (ROBAXIN) 500 MG tablet Take 1 tablet (500 mg total) by mouth 3 (three) times daily as needed for muscle spasms. 06/28/22  Yes Eliezer Lofts, FNP  Multiple Vitamin (MULTIVITAMIN) tablet Take 1 tablet by mouth daily.    [provider]  predniSONE (STERAPRED UNI-PAK 21 TAB) 10 MG (21) TBPK tablet Take by mouth daily. Take 6 tabs by mouth daily  for 2 days, then 5 tabs for 2 days, then 4 tabs for 2 days, then 3 tabs for 2 days, 2 tabs for 2 days, then 1 tab by mouth daily for 2 days 06/28/22  Yes Eliezer Lofts, FNP    Family History Family History  Problem Relation Age of Onset   Hypertension Father    Heart attack Father    Stroke Paternal Grandmother    Hypertension Paternal Grandfather    Colon cancer Neg Hx     Stomach cancer Neg Hx    Esophageal cancer Neg Hx     Social History Social History   Tobacco Use   Smoking status: Never   Smokeless tobacco: Never  Vaping Use   Vaping Use: Never used  Substance Use Topics   Alcohol use: No    Alcohol/week: 0.0 standard drinks of alcohol   Drug use: No     Allergies   Augmentin [amoxicillin-pot clavulanate] and Phentermine   Review of Systems Review of Systems  Musculoskeletal:        Right knee pain x1 day     Physical Exam Triage Vital Signs ED Triage Vitals  Enc Vitals Group     BP 06/28/22 1157 119/67     Pulse Rate 06/28/22 1157 86     Resp 06/28/22 1157 20     Temp 06/28/22 1157 97.8 F (36.6 C)     Temp Source 06/28/22 1157 Oral     SpO2 06/28/22 1157 100 %     Weight 06/28/22 1155 232 lb (105.2 kg)     Height 06/28/22 1155 5' (1.524 m)     Head Circumference --      Peak Flow --      Pain Score 06/28/22 1154 10     Pain Loc --      Pain Edu? --      Excl. in [place]? --    No data found.  Updated Vital Signs BP 119/67 (BP Location: Right Arm)   Pulse 86   Temp 97.8 F (36.6 C) (Oral)   Resp 20   Ht 5' (1.524 m)   Wt 232 lb (105.2 kg)   SpO2 100%   BMI 45.31 kg/m    Physical Exam Vitals and nursing note reviewed.  Constitutional:      Appearance: She is obese.  HENT:     Head: Normocephalic and atraumatic.     Mouth/Throat:     Mouth: Mucous membranes are moist.     Pharynx: Oropharynx is clear.  Eyes:     Extraocular Movements: Extraocular movements intact.     Conjunctiva/sclera: Conjunctivae normal.     Pupils: Pupils are equal, round, and reactive to light.  Cardiovascular:     Rate and Rhythm: Normal rate and regular rhythm.     Pulses: Normal pulses.     Heart sounds: Normal heart sounds.  Pulmonary:     Effort: Pulmonary effort is normal.  Breath sounds: Normal breath sounds. No wheezing, rhonchi or rales.  Musculoskeletal:     Cervical back: Normal range of motion and neck supple.      Comments: Right knee: TTP over over surface of patella, with moderate soft tissue swelling noted, exam limited due to pain today  Skin:    General: Skin is warm and dry.  Neurological:     General: No focal deficit present.     Mental Status: She is alert and oriented to person, place, and time. Mental status is at baseline.      UC Treatments / Results  Labs (all labs ordered are listed, but only abnormal results are displayed) Labs Reviewed - No data to display  EKG   Radiology DG Knee Complete 4 Views Right  Result Date: 06/28/2022 CLINICAL DATA:  Right knee pain EXAM: RIGHT KNEE - COMPLETE 4+ VIEW COMPARISON:  X-ray 02/23/2011 FINDINGS: No acute fracture or dislocation. Severe tricompartmental osteoarthritic changes of the right knee, most pronounced within the medial compartment. Findings have progressed from prior. Knee joint effusion with loose bodies in the suprapatellar pouch measuring up to 1.8 cm. No focal soft tissue swelling is evident. IMPRESSION: 1. Severe tricompartmental osteoarthritic changes of the right knee, most pronounced within the medial compartment. Findings have progressed from prior. 2. Knee joint effusion with loose bodies in the suprapatellar pouch. Electronically Signed   By: Davina Poke D.O.   On: 06/28/2022 13:02    Procedures Procedures (including critical care time)  Medications Ordered in UC Medications - No data to display  Initial Impression / Assessment and Plan / UC Course  I have reviewed the triage vital signs and the nursing notes.  Pertinent labs & imaging results that were available during my care of the patient were reviewed by me and considered in my medical decision making (see chart for details).     MDM: 1.  Right knee pain-right knee x-ray revealed above, Rx'd Sterapred Unipak, Robaxin, Vicodin. Advised/informed patient of right knee x-ray results with hard copy provided.  Advised patient to take medication as directed with  food to completion.  Advised may use Robaxin daily as needed for accompanying muscle spasms.  Advised may use Norco for breakthrough right knee pain.  Encouraged patient to increase daily water intake while taking these medications.  Advised patient if symptoms worsen and/or unresolved please follow-up with PCP, orthopedic provider or here for further evaluation.  Patient discharged home, hemodynamically stable. Final Clinical Impressions(s) / UC Diagnoses   Final diagnoses:  Acute pain of right knee     Discharge Instructions      Advised/informed patient of right knee x-ray results with hard copy provided.  Advised patient to take medication as directed with food to completion.  Advised may use Robaxin daily as needed for accompanying muscle spasms.  Advised may use Norco for breakthrough right knee pain.  Encouraged patient to increase daily water intake while taking these medications.  Advised patient if symptoms worsen and/or unresolved please follow-up with PCP, orthopedic provider or here for further evaluation.     ED Prescriptions     Medication Sig Dispense Auth. Provider   predniSONE (STERAPRED UNI-PAK 21 TAB) 10 MG (21) TBPK tablet Take by mouth daily. Take 6 tabs by mouth daily  for 2 days, then 5 tabs for 2 days, then 4 tabs for 2 days, then 3 tabs for 2 days, 2 tabs for 2 days, then 1 tab by mouth daily for 2 days 42 tablet  Eliezer Lofts, FNP   methocarbamol (ROBAXIN) 500 MG tablet Take 1 tablet (500 mg total) by mouth 3 (three) times daily as needed for muscle spasms. 30 tablet Eliezer Lofts, FNP   HYDROcodone-acetaminophen (NORCO/VICODIN) 5-325 MG tablet Take 1 tablet by mouth every 8 (eight) hours as needed. 24 tablet Eliezer Lofts, FNP      I have reviewed the PDMP during this encounter.   Eliezer Lofts, [place] 06/28/22 1326

## 2022-06-28 NOTE — ED Triage Notes (Signed)
Pt presents to Urgent Care with c/o acute on chronic R knee pain since yesterday morning.

## 2022-06-28 NOTE — Discharge Instructions (Addendum)
Advised/informed patient of right knee x-ray results with hard copy provided.  Advised patient to take medication as directed with food to completion.  Advised may use Robaxin daily as needed for accompanying muscle spasms.  Advised may use Norco for breakthrough right knee pain.  Encouraged patient to increase daily water intake while taking these medications.  Advised patient if symptoms worsen and/or unresolved please follow-up with PCP, orthopedic provider or here for further evaluation.

## 2022-07-19 ENCOUNTER — Encounter: Payer: Self-pay | Admitting: Family Medicine

## 2022-10-13 ENCOUNTER — Ambulatory Visit (INDEPENDENT_AMBULATORY_CARE_PROVIDER_SITE_OTHER): Payer: Medicare Other | Admitting: Family Medicine

## 2022-10-13 VITALS — BP 133/85 | HR 78 | Ht 60.0 in | Wt 235.1 lb

## 2022-10-13 DIAGNOSIS — Z0000 Encounter for general adult medical examination without abnormal findings: Secondary | ICD-10-CM

## 2022-10-13 DIAGNOSIS — Z23 Encounter for immunization: Secondary | ICD-10-CM

## 2022-10-13 DIAGNOSIS — Z780 Asymptomatic menopausal state: Secondary | ICD-10-CM | POA: Diagnosis not present

## 2022-10-13 NOTE — Patient Instructions (Addendum)
[place] Maintenance Summary and Written Plan of Care  Ms. [name] ,  Thank you for allowing me to perform your Medicare Annual Wellness Visit and for your ongoing commitment to your health.   Health Maintenance & Immunization History Health Maintenance  Topic Date Due   COVID-19 Vaccine (4 - 2023-24 season) 10/29/2022 (Originally 07/04/2022)   Pneumonia Vaccine 13+ Years old (2 - PCV) 10/14/2023 (Originally 10/26/2021)   DEXA SCAN  10/14/2023 (Originally 10/26/2021)   Medicare Annual Wellness (AWV)  10/14/2023   MAMMOGRAM  06/23/2024   DTaP/Tdap/Td (2 - Td or Tdap) 08/02/2024   PAP SMEAR-Modifier  02/05/2026   COLONOSCOPY (Pts 45-33yr Insurance coverage will need to be confirmed)  05/10/2026   INFLUENZA VACCINE  Completed   Hepatitis C Screening  Completed   HIV Screening  Completed   Zoster Vaccines- Shingrix  Completed   HPV VACCINES  Aged Out   Immunization History  Administered Date(s) Administered   Fluad Quad(high Dose 65+) 10/13/2022   Influenza,inj,Quad PF,6+ Mos 08/05/2017, 10/31/2019, 10/07/2021   Influenza,trivalent, recombinat, inj, PF 08/02/2012   Influenza-Unspecified 08/02/2012, 08/05/2017, 08/21/2018   PFIZER(Purple Top)SARS-COV-2 Vaccination 02/10/2020, 03/03/2020, 10/12/2020   Pneumococcal Polysaccharide-23 06/30/2018, 08/21/2018   Tdap 08/02/2014   Zoster Recombinat (Shingrix) 10/31/2019, 06/01/2020    These are the patient goals that we discussed:  Goals Addressed               This Visit's Progress     Patient Stated (pt-stated)        Patient would like to loose some weight and get her knee surgery.         This is a list of Health Maintenance Items that are overdue or due now: Pneumococcal vaccine  Influenza vaccine Bone densitometry screening    Orders/Referrals Placed Today: Orders Placed This Encounter  Procedures   DEXAScan    Standing Status:   Future    Standing Expiration Date:   10/14/2023     Scheduling Instructions:     Please call patient to schedule    Order Specific Question:   Reason for exam:    Answer:   Parks menopausal    Order Specific Question:   Preferred imaging location?    Answer:   [facility]  Flu Vaccine QUAD High Dose(Fluad)   (Contact our referral department at [phone] you have not spoken with someone about your referral appointment within the next 5 days)    Follow-up Plan Follow-up with MHali Marry MD as planned Schedule pneumonia vaccine either at the pharmacy or at your next in-office visit with pcp.  Medicare wellness visit in one year. AVS printed and given to the patient.      Health Maintenance, Female Adopting a healthy lifestyle and getting preventive care are important in promoting health and wellness. Ask your health care provider about: The right schedule for you to have regular tests and exams. Things you can do on your own to prevent diseases and keep yourself healthy. What should I know about diet, weight, and exercise? Eat a healthy diet  Eat a diet that includes plenty of vegetables, fruits, low-fat dairy products, and lean protein. Do not eat a lot of foods that are high in solid fats, added sugars, or sodium. Maintain a healthy weight Body mass index (BMI) is used to identify weight problems. It estimates body fat based on height and weight. Your health care provider can help determine your BMI and help you achieve or  maintain a healthy weight. Get regular exercise Get regular exercise. This is one of the most important things you can do for your health. Most adults should: Exercise for at least 150 minutes each week. The exercise should increase your heart rate and make you sweat (moderate-intensity exercise). Do strengthening exercises at least twice a week. This is in addition to the moderate-intensity exercise. Spend less time sitting. Even light physical activity can be beneficial. Watch  cholesterol and blood lipids Have your blood tested for lipids and cholesterol at 66 years of age, then have this test every 5 years. Have your cholesterol levels checked more often if: Your lipid or cholesterol levels are high. You are older than 66 years of age. You are at high risk for heart disease. What should I know about cancer screening? Depending on your health history and family history, you may need to have cancer screening at various ages. This may include screening for: Breast cancer. Cervical cancer. Colorectal cancer. Skin cancer. Lung cancer. What should I know about heart disease, diabetes, and high blood pressure? Blood pressure and heart disease High blood pressure causes heart disease and increases the risk of stroke. This is more likely to develop in people who have high blood pressure readings or are overweight. Have your blood pressure checked: Every 3-5 years if you are 84-81 years of age. Every year if you are 72 years old or older. Diabetes Have regular diabetes screenings. This checks your fasting blood sugar level. Have the screening done: Once every three years after age 49 if you are at a normal weight and have a low risk for diabetes. More often and at a younger age if you are overweight or have a high risk for diabetes. What should I know about preventing infection? Hepatitis B If you have a higher risk for hepatitis B, you should be screened for this virus. Talk with your health care provider to find out if you are at risk for hepatitis B infection. Hepatitis C Testing is recommended for: Everyone born from [birth year] through 1965. Anyone with known risk factors for hepatitis C. Sexually transmitted infections (STIs) Get screened for STIs, including gonorrhea and chlamydia, if: You are sexually active and are younger than 66 years of age. You are older than 66 years of age and your health care provider tells you that you are at risk for this type of  infection. Your sexual activity has changed since you were last screened, and you are at increased risk for chlamydia or gonorrhea. Ask your health care provider if you are at risk. Ask your health care provider about whether you are at high risk for HIV. Your health care provider may recommend a prescription medicine to help prevent HIV infection. If you choose to take medicine to prevent HIV, you should first get tested for HIV. You should then be tested every 3 months for as long as you are taking the medicine. Pregnancy If you are about to stop having your period (premenopausal) and you may become pregnant, seek counseling before you get pregnant. Take 400 to 800 micrograms (mcg) of folic acid every day if you become pregnant. Ask for birth control (contraception) if you want to prevent pregnancy. Osteoporosis and menopause Osteoporosis is a disease in which the bones lose minerals and strength with aging. This can result in bone fractures. If you are 41 years old or older, or if you are at risk for osteoporosis and fractures, ask your health care provider if you should:  Be screened for bone loss. Take a calcium or vitamin D supplement to lower your risk of fractures. Be given hormone replacement therapy (HRT) to treat symptoms of menopause. Follow these instructions at home: Alcohol use Do not drink alcohol if: Your health care provider tells you not to drink. You are pregnant, may be pregnant, or are planning to become pregnant. If you drink alcohol: Limit how much you have to: 0-1 drink a day. Know how much alcohol is in your drink. In the [place] one drink equals one 12 oz bottle of beer (355 mL), one 5 oz glass of wine (148 mL), or one 1 oz glass of hard liquor (44 mL). Lifestyle Do not use any products that contain nicotine or tobacco. These products include cigarettes, chewing tobacco, and vaping devices, such as e-cigarettes. If you need help quitting, ask your health care  provider. Do not use street drugs. Do not share needles. Ask your health care provider for help if you need support or information about quitting drugs. General instructions Schedule regular health, dental, and eye exams. Stay current with your vaccines. Tell your health care provider if: You often feel depressed. You have ever been abused or do not feel safe at home. Summary Adopting a healthy lifestyle and getting preventive care are important in promoting health and wellness. Follow your health care provider's instructions about healthy diet, exercising, and getting tested or screened for diseases. Follow your health care provider's instructions on monitoring your cholesterol and blood pressure. This information is not intended to replace advice given to you by your health care provider. Make sure you discuss any questions you have with your health care provider. Document Revised: 03/11/2021 Document Reviewed: 03/11/2021 Elsevier Patient Education  [place].

## 2022-10-13 NOTE — Progress Notes (Signed)
MEDICARE ANNUAL WELLNESS VISIT  10/13/2022  Subjective:  [name] is a 66 y.o. female patient of Metheney, Rene Kocher, MD who had a Medicare Annual Wellness Visit today. [name] is Retired and lives with their spouse. she has 3 children. she reports that she is socially active and does interact with friends/family regularly. she is minimally physically active and enjoys reading.  Patient Care Team: Hali Marry, MD as PCP - General (Family Medicine)     10/13/2022    1:20 PM 05/21/2017    8:04 AM 10/02/2015    9:40 AM 09/11/2014    8:23 AM  Advanced Directives  Does Patient Have a Medical Advance Directive? No No No No  Would patient like information on creating a medical advance directive? No - Patient declined No - Patient declined No - patient declined information     Hospital Utilization Over the Past 12 Months: # of hospitalizations or ER visits: 0 # of surgeries: 0  Review of Systems    Patient reports that her overall health is unchanged when compared to last year.  Review of Systems: History obtained from chart review and the patient  All other systems negative.  Pain Assessment Pain : 0-10 Pain Score: 4  Pain Type: Chronic pain Pain Location: Knee Pain Orientation: Left, Right Pain Descriptors / Indicators: Constant Pain Onset: More than a month ago Pain Frequency: Constant Pain Relieving Factors: none  Pain Relieving Factors: none  Current Medications & Allergies (verified) Allergies as of 10/13/2022       Reactions   Augmentin [amoxicillin-pot Clavulanate] Diarrhea   Phentermine    Numbness tingling/feels weird.         Medication List        Accurate as of October 13, 2022  1:48 PM. If you have any questions, ask your nurse or doctor.          acetaminophen 500 MG tablet Commonly known as: TYLENOL Take 500 mg by mouth every 6 (six) hours as needed.   albuterol 108 (90 Base) MCG/ACT inhaler Commonly known as: VENTOLIN  HFA Inhale 2 puffs into the lungs every 6 (six) hours as needed for wheezing or shortness of breath.   albuterol (2.5 MG/3ML) 0.083% nebulizer solution Commonly known as: PROVENTIL Take 3 mLs (2.5 mg total) by nebulization every 4 (four) hours as needed for wheezing or shortness of breath.   cetirizine 10 MG tablet Commonly known as: ZYRTEC Take 10 mg by mouth daily.   fluticasone 50 MCG/ACT nasal spray Commonly known as: FLONASE Place 2 sprays into both nostrils daily.   HYDROcodone-acetaminophen 5-325 MG tablet Commonly known as: NORCO/VICODIN Take 1 tablet by mouth every 8 (eight) hours as needed.   methocarbamol 500 MG tablet Commonly known as: ROBAXIN Take 1 tablet (500 mg total) by mouth 3 (three) times daily as needed for muscle spasms.   multivitamin tablet Take 1 tablet by mouth daily.   predniSONE 10 MG (21) Tbpk tablet Commonly known as: STERAPRED UNI-PAK 21 TAB Take by mouth daily. Take 6 tabs by mouth daily  for 2 days, then 5 tabs for 2 days, then 4 tabs for 2 days, then 3 tabs for 2 days, 2 tabs for 2 days, then 1 tab by mouth daily for 2 days   VITAMIN D3 PO Take by mouth.        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   Blood transfusion without reported diagnosis    1980's   Colon cancer ([place]) 2009   colon   GERD (gastroesophageal reflux disease)    Hiatal hernia    Hypertension    Past Surgical History:  Procedure Laterality Date   [place] WITH PROPOFOL N/A 10/02/2015   Procedure: COLONOSCOPY WITH PROPOFOL;  Surgeon: Ladene Artist, MD;  Location: [facility];  Service: Endoscopy;  Laterality: N/A;   laparoscopic sleeve gastrectomy N/A    Surgeon- Laurette Schimke Teppara   PARTIAL COLECTOMY  2009   POLYPECTOMY     TUBAL LIGATION  1989    Family History  Problem Relation Age of Onset   Hypertension Father    Heart attack Father    Stroke Paternal Grandmother    Hypertension Paternal Grandfather    Colon cancer Neg Hx    Stomach cancer Neg Hx    Esophageal cancer Neg Hx    Social History   Socioeconomic History   Marital status: Married    Spouse name: Jeneen Rinks   Number of children: 3   Years of education: 16   Highest education level: Bachelor's degree (e.g., BA, AB, BS)  Occupational History   Occupation: Retired Therapist, sports  Tobacco Use   Smoking status: Never   Smokeless tobacco: Never  Vaping Use   Vaping Use: Never used  Substance and Sexual Activity   Alcohol use: No    Alcohol/week: 0.0 standard drinks of alcohol   Drug use: No   Sexual activity: Not on file  Other Topics Concern   Not on file  Social History Narrative   Lives with her husband. She enjoys reading.   Social Determinants of Health   Financial Resource Strain: Low Risk  (10/13/2022)   Overall Financial Resource Strain (CARDIA)    Difficulty of Paying Living Expenses: Not hard at all  Food Insecurity: No Food Insecurity (10/13/2022)   Hunger Vital Sign    Worried About Running Out of Food in the Last Year: Never true    Ran Out of Food in the Last Year: Never true  Transportation Needs: No Transportation Needs (10/13/2022)   PRAPARE - Hydrologist (Medical): No    Lack of Transportation (Non-Medical): No  Physical Activity: Inactive (10/13/2022)   Exercise Vital Sign    Days of Exercise per Week: 0 days    Minutes of Exercise per Session: 0 min  Stress: No Stress Concern Present (10/13/2022)   [name]    Feeling of Stress : Not at all  Social Connections: Moderately Integrated (10/13/2022)   Social Connection and Isolation Panel [NHANES]    Frequency of Communication with Friends and Family: More than three times a week    Frequency of  Social Gatherings with Friends and Family: Never    Attends Religious Services: More than 4 times per year    Active Member of [employer] or Organizations: No    Attends Archivist Meetings: Never    Marital Status: Married  Recent Concern: Social Connections - Moderately Isolated (10/13/2022)   Social Connection and Isolation Panel [NHANES]    Frequency of Communication with Friends and Family: More than three times a week    Frequency of Social Gatherings with Friends and Family:  Never    Attends Religious Services: Never    Active Member of Clubs or Organizations: No    Attends Archivist Meetings: Never    Marital Status: Married    Activities of Daily Living    10/13/2022    1:31 PM  In your present state of health, do you have any difficulty performing the following activities:  Hearing? 1  Comment has hearing aids.  Vision? 0  Difficulty concentrating or making decisions? 0  Walking or climbing stairs? 1  Comment knee pain  Dressing or bathing? 0  Doing errands, shopping? 0  Preparing Food and eating ? N  Using the Toilet? N  In the past six months, have you accidently leaked urine? N  Do you have problems with loss of bowel control? N  Managing your Medications? N  Managing your Finances? N  Housekeeping or managing your Housekeeping? N    Patient Education/Literacy How often do you need to have someone help you when you read instructions, pamphlets, or other written materials from your doctor or pharmacy?: 1 - Never What is the last grade level you completed in school?: bachelor's  Exercise Current Exercise Habits: The patient does not participate in regular exercise at present, Exercise limited by: orthopedic condition(s) (knee pain)  Diet Patient reports consuming 2 meals a day and 2 snack(s) a day Patient reports that her primary diet is: Regular Patient reports that she does have regular access to food.   Depression Screen    10/13/2022     1:21 PM 03/12/2022    9:59 AM 03/12/2022    9:36 AM 02/05/2021    3:46 PM 10/31/2019    7:33 AM 03/22/2018    8:20 AM 09/29/2017    8:37 AM  PHQ 2/9 Scores  PHQ - 2 Score 0 0 0 0 0 0 0     Fall Risk    10/13/2022    1:21 PM 03/12/2022    9:59 AM 02/05/2021    3:45 PM 10/31/2019    7:33 AM  [place] in the past year? 0 0 0 0  Number falls in past yr: 0 0  0  Injury with Fall? 0 0  0  Risk for fall due to : No Fall Risks No Fall Risks No Fall Risks   Follow up Falls evaluation completed Falls prevention discussed       Objective:   BP 133/85 (BP Location: Right Arm, Patient Position: Sitting, Cuff Size: Large)   Pulse 78   Ht 5' (1.524 m)   Wt 235 lb 1.9 oz (106.6 kg)   SpO2 97%   BMI 45.92 kg/m   Last Weight  Most recent update: 10/13/2022  1:12 PM    Weight  106.6 kg (235 lb 1.9 oz)             Body mass index is 45.92 kg/m.  Hearing/Vision  Melannie did not have difficulty with hearing/understanding during the face-to-face interview Emiliana did not have difficulty with her vision during the face-to-face interview Reports that she has not had a formal eye exam by an eye care professional within the past year Reports that she has had a formal hearing evaluation within the past year  Cognitive Function:    10/13/2022    1:34 PM  6CIT Screen  What Year? 0 points  What month? 0 points  What time? 0 points  Count back from 20 0 points  Months in reverse 0 points  Repeat phrase 0 points  Total Score 0 points    Normal Cognitive Function Screening: Yes (Normal:0-7, Significant for Dysfunction: >8)  Immunization & Health Maintenance Record Immunization History  Administered Date(s) Administered   Fluad Quad(high Dose 65+) 10/13/2022   Influenza,inj,Quad PF,6+ Mos 08/05/2017, 10/31/2019, 10/07/2021   Influenza,trivalent, recombinat, inj, PF 08/02/2012   Influenza-Unspecified 08/02/2012, 08/05/2017, 08/21/2018   PFIZER(Purple Top)SARS-COV-2  Vaccination 02/10/2020, 03/03/2020, 10/12/2020   Pneumococcal Polysaccharide-23 06/30/2018, 08/21/2018   Tdap 08/02/2014   Zoster Recombinat (Shingrix) 10/31/2019, 06/01/2020    Health Maintenance  Topic Date Due   COVID-19 Vaccine (4 - 2023-24 season) 10/29/2022 (Originally 07/04/2022)   Pneumonia Vaccine 64+ Years old (2 - PCV) 10/14/2023 (Originally 10/26/2021)   DEXA SCAN  10/14/2023 (Originally 10/26/2021)   Medicare Annual Wellness (AWV)  10/14/2023   MAMMOGRAM  06/23/2024   DTaP/Tdap/Td (2 - Td or Tdap) 08/02/2024   PAP SMEAR-Modifier  02/05/2026   COLONOSCOPY (Pts 45-33yr Insurance coverage will need to be confirmed)  05/10/2026   INFLUENZA VACCINE  Completed   Hepatitis C Screening  Completed   HIV Screening  Completed   Zoster Vaccines- Shingrix  Completed   HPV VACCINES  Aged Out       Assessment  This is a routine wellness examination for DMAHAILA TISCHER  Health Maintenance: Due or Overdue There are no preventive care reminders to display for this patient.   DEulis Manlydoes not need a referral for Community Assistance: Care Management:   no Social Work:    no Prescription Assistance:  no Nutrition/Diabetes Education:  no   Plan:  Personalized Goals  Goals Addressed               This Visit's Progress     Patient Stated (pt-stated)        Patient would like to loose some weight and get her knee surgery.       Personalized Health Maintenance & Screening Recommendations  Pneumococcal vaccine  Influenza vaccine Bone densitometry screening  Lung Cancer Screening Recommended: no (Low Dose CT Chest recommended if Age 66-80years, 30 pack-year currently smoking OR have quit w/in past 15 years) Hepatitis C Screening recommended: no HIV Screening recommended: no  Advanced Directives: Written information was not given per the patient's request.  Referrals & Orders Orders Placed This Encounter  Procedures   DEXAScan   Flu Vaccine QUAD High  Dose(Fluad)    Follow-up Plan Follow-up with MHali Marry MD as planned Schedule pneumonia vaccine either at the pharmacy or at your next in-office visit with pcp.  Medicare wellness visit in one year. AVS printed and given to the patient.   I have personally reviewed and noted the following in the patient's chart:   Medical and social history Use of alcohol, tobacco or illicit drugs  Current medications and supplements Functional ability and status Nutritional status Physical activity Advanced directives List of other physicians Hospitalizations, surgeries, and ER visits in previous 12 months Vitals Screenings to include cognitive, depression, and falls Referrals and appointments  In addition, I have reviewed and discussed with patient certain preventive protocols, quality metrics, and best practice recommendations. A written personalized care plan for preventive services as well as general preventive health recommendations were provided to patient.     BTinnie Gens RN BSN  10/13/2022

## 2022-11-05 ENCOUNTER — Other Ambulatory Visit: Payer: TRICARE For Life (TFL)

## 2022-11-26 ENCOUNTER — Ambulatory Visit (INDEPENDENT_AMBULATORY_CARE_PROVIDER_SITE_OTHER): Payer: Medicare Other

## 2022-11-26 DIAGNOSIS — Z780 Asymptomatic menopausal state: Secondary | ICD-10-CM

## 2022-11-26 DIAGNOSIS — Z0000 Encounter for general adult medical examination without abnormal findings: Secondary | ICD-10-CM

## 2022-11-26 NOTE — Progress Notes (Signed)
Call patient: Bone density test shows a T-score of -1.8.  This is in the mildly thin category called osteopenia.  The current recommendation for osteopenia (mildly thin bones) treatment includes:   #1 calcium-total of 1200 mg of calcium daily.  If you eat a very calcium rich diet you may be able to obtain that without a supplement.  If not, then I recommend calcium 500 mg twice a day.  There are several products over-the-counter such as Caltrate D and Viactiv chews which are great options that contain calcium and vitamin D. #2 vitamin D-recommend 800 international units daily. #3 exercise-recommend 30 minutes of weightbearing exercise 3 days a week.  Resistance training ,such as doing bands and light weights, can be particularly helpful.

## 2022-12-17 ENCOUNTER — Ambulatory Visit: Payer: TRICARE For Life (TFL) | Admitting: Family Medicine

## 2022-12-18 ENCOUNTER — Encounter: Payer: Self-pay | Admitting: Family Medicine

## 2022-12-18 ENCOUNTER — Ambulatory Visit (INDEPENDENT_AMBULATORY_CARE_PROVIDER_SITE_OTHER): Payer: Medicare Other | Admitting: Family Medicine

## 2022-12-18 VITALS — BP 132/72 | HR 75 | Ht 60.0 in | Wt 239.0 lb

## 2022-12-18 DIAGNOSIS — G4733 Obstructive sleep apnea (adult) (pediatric): Secondary | ICD-10-CM

## 2022-12-18 DIAGNOSIS — R319 Hematuria, unspecified: Secondary | ICD-10-CM

## 2022-12-18 DIAGNOSIS — R3915 Urgency of urination: Secondary | ICD-10-CM | POA: Diagnosis not present

## 2022-12-18 DIAGNOSIS — R7301 Impaired fasting glucose: Secondary | ICD-10-CM | POA: Diagnosis not present

## 2022-12-18 DIAGNOSIS — I10 Essential (primary) hypertension: Secondary | ICD-10-CM

## 2022-12-18 DIAGNOSIS — Z7689 Persons encountering health services in other specified circumstances: Secondary | ICD-10-CM

## 2022-12-18 DIAGNOSIS — M170 Bilateral primary osteoarthritis of knee: Secondary | ICD-10-CM

## 2022-12-18 LAB — POCT URINALYSIS DIP (CLINITEK)
Bilirubin, UA: NEGATIVE
Glucose, UA: NEGATIVE mg/dL
Ketones, POC UA: NEGATIVE mg/dL
Nitrite, UA: NEGATIVE
POC PROTEIN,UA: NEGATIVE
Spec Grav, UA: 1.025 (ref 1.010–1.025)
Urobilinogen, UA: 0.2 E.U./dL
pH, UA: 6 (ref 5.0–8.0)

## 2022-12-18 LAB — POCT GLYCOSYLATED HEMOGLOBIN (HGB A1C): Hemoglobin A1C: 5.6 % (ref 4.0–5.6)

## 2022-12-18 MED ORDER — SEMAGLUTIDE-WEIGHT MANAGEMENT 0.25 MG/0.5ML ~~LOC~~ SOAJ: 0.2500 mg | SUBCUTANEOUS | 0 refills | Status: AC

## 2022-12-18 NOTE — Assessment & Plan Note (Addendum)
We discussed options that are currently on the market I am also can give her a list in case this 1 is not covered by her insurance are unavailable.  Then she can check with them to see what is covered.  I think it could be incredibly powerful to help reduce her BMI so that she can have her knee surgery that she has been trying to get scheduled for over a year at this point.  She has a prior history of sleeve gastrectomy.  Currently has severe OA requiring surgery as well as hypertension, prediabetes, and obstructive sleep apnea.  All conditions secondary to her morbid obesity.  Current BMI is 46.   Visit #: 1 Starting Weight: 239 lbs   Current weight: 239 lbs  Previous weight: Change in weight: Goal weight: 199 lbs  Dietary goals:work on cutting back on portions, drinking more water.  Exercise goals: swim class once a week.  Medication: Start Wegovy 0.65m  Follow-up and referrals: 4 weeks after new start medication.

## 2022-12-18 NOTE — Patient Instructions (Signed)
You can check with your insurance to see if any of the following medications are covered: Wegovy, Zepp bound, Saxenda, Contrave, Qsymia.

## 2022-12-18 NOTE — Assessment & Plan Note (Signed)
Last A1c was almost 2 years ago but looks good at 5.5 close plan to recheck again today.

## 2022-12-18 NOTE — Progress Notes (Signed)
Established Patient Office Visit  Subjective   Patient ID: [name], female    DOB: 1956/06/30  Age: 67 y.o. MRN: [id number]  Chief Complaint  Patient presents with   discuss weight loss    HPI She would like to discuss options for weight loss management she has bilateral knee osteoarthritis and would like to be a candidate for surgery but would need to get her BMI down to less than 40.  They originally wanted to do the surgery last spring.  But her BMI was not under 40.  Goal is less than 200 pounds.  So they postponed it..  Urinary frequent and urgency.  No fever or chills she wonders if she might be getting the beginning of a urinary tract infection.  She recently recently started getting back into swim lessons once a week she has been going for about 3 weeks consecutively she knows she needs to start moving more.  She has had to start relying on a cane because of her knees.     ROS    Objective:     BP 132/72 (BP Location: Left Arm, Patient Position: Sitting, Cuff Size: Large)   Pulse 75   Ht 5' (1.524 m)   Wt 239 lb 0.6 oz (108.4 kg)   SpO2 99%   BMI 46.68 kg/m    Physical Exam Vitals and nursing note reviewed.  Constitutional:      Appearance: She is well-developed.  HENT:     Head: Normocephalic and atraumatic.  Cardiovascular:     Rate and Rhythm: Normal rate and regular rhythm.     Heart sounds: Normal heart sounds.  Pulmonary:     Effort: Pulmonary effort is normal.     Breath sounds: Normal breath sounds.  Skin:    General: Skin is warm and dry.  Neurological:     Mental Status: She is alert and oriented to person, place, and time.  Psychiatric:        Behavior: Behavior normal.      Results for orders placed or performed in visit on 12/18/22  POCT glycosylated hemoglobin (Hb A1C)  Result Value Ref Range   Hemoglobin A1C 5.6 4.0 - 5.6 %   HbA1c POC (<> result, manual entry)     HbA1c, POC (prediabetic range)     HbA1c, POC (controlled  diabetic range)    POCT URINALYSIS DIP (CLINITEK)  Result Value Ref Range   Color, UA yellow yellow   Clarity, UA clear clear   Glucose, UA negative negative mg/dL   Bilirubin, UA negative negative   Ketones, POC UA negative negative mg/dL   Spec Grav, UA 1.025 1.010 - 1.025   Blood, UA trace-lysed (A) negative   pH, UA 6.0 5.0 - 8.0   POC PROTEIN,UA negative negative, trace   Urobilinogen, UA 0.2 0.2 or 1.0 E.U./dL   Nitrite, UA Negative Negative   Leukocytes, UA Trace (A) Negative      The 10-year ASCVD risk score (Arnett DK, et al., 2019) is: 10.4%    Assessment & Plan:   Problem List Items Addressed This Visit       Cardiovascular and Mediastinum   HTN (hypertension), benign    Well controlled. Continue current regimen. Follow up in  20mo      Relevant Medications   Semaglutide-Weight Management 0.25 MG/0.5ML SOAJ     Respiratory   OSA (obstructive sleep apnea)   Relevant Medications   Semaglutide-Weight Management 0.25 MG/0.5ML SOAJ  Endocrine   IFG (impaired fasting glucose)    Last A1c was almost 2 years ago but looks good at 5.5 close plan to recheck again today.      Relevant Medications   Semaglutide-Weight Management 0.25 MG/0.5ML SOAJ   Other Relevant Orders   POCT glycosylated hemoglobin (Hb A1C) (Completed)     Musculoskeletal and Integument   Knee osteoarthritis   Relevant Medications   Semaglutide-Weight Management 0.25 MG/0.5ML SOAJ     Other   Morbid obesity ([place])   Relevant Medications   Semaglutide-Weight Management 0.25 MG/0.5ML SOAJ   Encounter for weight management - Primary    We discussed options that are currently on the market I am also can give her a list in case this 1 is not covered by her insurance are unavailable.  Then she can check with them to see what is covered.  I think it could be incredibly powerful to help reduce her BMI so that she can have her knee surgery that she has been trying to get scheduled for over a  year at this point.  She has a prior history of sleeve gastrectomy.  Currently has severe OA requiring surgery as well as hypertension, prediabetes, and obstructive sleep apnea.  All conditions secondary to her morbid obesity.  Current BMI is 46.   Visit #: 1 Starting Weight: 239 lbs   Current weight: 239 lbs  Previous weight: Change in weight: Goal weight: 199 lbs  Dietary goals:work on cutting back on portions, drinking more water.  Exercise goals: swim class once a week.  Medication: Start Wegovy 0.34m  Follow-up and referrals: 4 weeks after new start medication.        Relevant Medications   Semaglutide-Weight Management 0.25 MG/0.5ML SOAJ   Other Visit Diagnoses     Urgency of urination       Relevant Orders   POCT URINALYSIS DIP (CLINITEK) (Completed)   Urine Culture   Hematuria, unspecified type       Relevant Orders   Urine Culture      Urinary urgency and frequency-urinalysis today shows trace leuks and trace blood so we will send for culture.  We did discuss that if it is negative she may also be developing overactive bladder.  Weight loss may actually be helpful with some of the urgency and frequency but may ultimately need a referral.  Return in about 4 weeks (around 01/15/2023) for New start medication.    CBeatrice Lecher MD

## 2022-12-18 NOTE — Assessment & Plan Note (Signed)
Well controlled. Continue current regimen. Follow up in  6 mo  

## 2022-12-19 LAB — URINE CULTURE
MICRO NUMBER:: 14570417
SPECIMEN QUALITY:: ADEQUATE

## 2022-12-21 NOTE — Progress Notes (Signed)
Your urine culture is negative.  No active infection.

## 2022-12-22 ENCOUNTER — Telehealth: Payer: Self-pay

## 2022-12-22 NOTE — Telephone Encounter (Signed)
error 

## 2022-12-23 ENCOUNTER — Telehealth: Payer: Self-pay

## 2022-12-23 DIAGNOSIS — Z7689 Persons encountering health services in other specified circumstances: Secondary | ICD-10-CM

## 2022-12-23 MED ORDER — QSYMIA 3.75-23 MG PO CP24: 1.0000 | ORAL_CAPSULE | Freq: Every morning | ORAL | 0 refills | Status: DC

## 2022-12-23 MED ORDER — QSYMIA 7.5-46 MG PO CP24: 1.0000 | ORAL_CAPSULE | Freq: Every morning | ORAL | 0 refills | Status: DC

## 2022-12-23 NOTE — Telephone Encounter (Signed)
Initiated Prior authorization [id number] 0.25MG/0.5ML auto-injectors Via: Covermymeds Case/Key:[id number] Status: denied  as of 12/22/22 Reason:Coverage is provided in situations where the dates and durations of therapy or contraindications have been provided for the following agents tried and failed: phentermine, Qsymia or its individual generic components, and Contrave or its individual generic components. Coverage cannot be authorized at this time. Other coverage conditions may apply. Notified Pt via: pt does not have Mychart, called pt to notify  of denial, pt is ok to try qsymia

## 2022-12-23 NOTE — Telephone Encounter (Signed)
Meds ordered this encounter  Medications   Phentermine-Topiramate (QSYMIA) 3.75-23 MG CP24    Sig: Take 1 capsule by mouth in the morning for 14 days.    Dispense:  14 capsule    Refill:  0   Phentermine-Topiramate (QSYMIA) 7.5-46 MG CP24    Sig: Take 1 capsule by mouth every morning.    Dispense:  30 capsule    Refill:  0

## 2022-12-23 NOTE — Addendum Note (Signed)
Addended by: Beatrice Lecher D on: 12/23/2022 09:51 AM   Modules accepted: Orders

## 2023-01-06 ENCOUNTER — Telehealth: Payer: Self-pay

## 2023-01-06 ENCOUNTER — Other Ambulatory Visit: Payer: Self-pay

## 2023-01-06 DIAGNOSIS — Z7689 Persons encountering health services in other specified circumstances: Secondary | ICD-10-CM

## 2023-01-06 NOTE — Telephone Encounter (Signed)
Key, can you initiate a PA for her?

## 2023-01-06 NOTE — Telephone Encounter (Signed)
Patients pharmacy sent a fax requesting for the Qsymia 3.75-'23mg'$  capsules  for an alternative choice due to her plan does not cover the alternatives offered are: BenzphentamineHCL, DiethylpropionHCL, Phedimetrazinetartra, or Phentermine HCL   Please advise

## 2023-01-06 NOTE — Telephone Encounter (Signed)
Pa has been submitted pt received an approval Coverage Start Date:12/07/2022;Coverage End Date:05/06/2023;

## 2023-01-06 NOTE — Telephone Encounter (Signed)
Initiated Prior authorization [id number] 3.75-'23MG'$  er capsules Via: Covermymeds Case/Key:[id number]  Status: approved  as of 01/06/23 Reason:Coverage Start Date:12/07/2022;Coverage End Date:05/06/2023; Notified Pt via: Pt does not have Mychart , left detailed vm

## 2023-01-12 ENCOUNTER — Telehealth: Payer: Self-pay | Admitting: Family Medicine

## 2023-01-12 MED ORDER — ONDANSETRON HCL 4 MG PO TABS: 4.0000 mg | ORAL_TABLET | Freq: Three times a day (TID) | ORAL | 0 refills | Status: DC | PRN

## 2023-01-12 NOTE — Telephone Encounter (Signed)
Pt called requesting to speak to [facility] in regards to her appointment on 01/15/2023. Pt stated this appt was a 1 month f/u for her medication. However pt states she was unable to receive the medication until this past weekend. Pt wanted to know if she needs to reschedule this appointment.

## 2023-01-12 NOTE — Telephone Encounter (Signed)
Yes, please move appointment about 3 weeks out.  Before she is due for her refill for next month.  Zofran sent to pharmacy.  Meds ordered this encounter  Medications   ondansetron (ZOFRAN) 4 MG tablet    Sig: Take 1 tablet (4 mg total) by mouth every 8 (eight) hours as needed for nausea or vomiting.    Dispense:  20 tablet    Refill:  0

## 2023-01-12 NOTE — Telephone Encounter (Signed)
She states she really needs a permanent handicap placard. Please call when ready for pick up. She states she needs it before the end of the month.   Patient advised and rescheduled.

## 2023-01-12 NOTE — Telephone Encounter (Signed)
Did She Drop off the Form?  I Have Not Seen Anything in My Basket

## 2023-01-12 NOTE — Telephone Encounter (Signed)
Pt would like something to take for nausea.

## 2023-01-14 NOTE — Telephone Encounter (Signed)
She has not. She thought that we had the form.   She is asking that this be done for 5 years instead of 6 months. Will start form and place in Dr. Gardiner Ramus basket for signature.

## 2023-01-15 ENCOUNTER — Ambulatory Visit: Payer: TRICARE For Life (TFL) | Admitting: Family Medicine

## 2023-01-22 ENCOUNTER — Ambulatory Visit
Admission: EM | Admit: 2023-01-22 | Discharge: 2023-01-22 | Disposition: A | Discharge status: Home / Self Care | Payer: Medicare Other | Attending: Family Medicine | Admitting: Family Medicine

## 2023-01-22 DIAGNOSIS — S39012A Strain of muscle, fascia and tendon of lower back, initial encounter: Secondary | ICD-10-CM | POA: Diagnosis not present

## 2023-01-22 MED ORDER — TRAMADOL HCL 50 MG PO TABS: 50.0000 mg | ORAL_TABLET | Freq: Four times a day (QID) | ORAL | 0 refills | Status: DC | PRN

## 2023-01-22 MED ORDER — METHYLPREDNISOLONE 4 MG PO TBPK: ORAL_TABLET | ORAL | 0 refills | Status: DC

## 2023-01-22 NOTE — Discharge Instructions (Signed)
Take Medrol Dosepak as directed Use ice or heat to painful area Reduce activity while back is painful Take tramadol as needed for pain See your doctor or Dr. Dianah Parks if you fail to improve by next week

## 2023-01-22 NOTE — ED Triage Notes (Signed)
Pt presents to Urgent Care with c/o pain to R buttocks x 4 days. States she noticed while rolling over in bed.

## 2023-01-22 NOTE — ED Provider Notes (Addendum)
[name] [facility]    CSN: [id number] Arrival date & time: 01/22/23  1206      History   Chief Complaint Chief Complaint  Patient presents with   buttocks pain--R side     HPI [name] is a 67 y.o. female.   HPI  Patient states that she has arthritis in both of her knees.  She states that she was exercising to try to lose weight and increase her strength prior to knee surgery.  She has been working out at a gym.  She does not remember any specific activity that might of hurt.  She states she first noticed the pain when she was in bed.  She was in the middle of a king-size bed and having to work to get to the edge and get out.  She had some twisting movement.  Pain in the left hip (points to her SI area).  No radiation.  No numbness or weakness.  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   Sebaceous cyst 11/02/2019   OSA (obstructive sleep apnea) 03/22/2018   Trochanteric bursitis of right hip 05/14/2017   Morbid obesity ([place]) 08/01/2016   IFG (impaired fasting glucose) 07/14/2016   Primary osteoarthritis of both hands 08/03/2015   Elevated LDL cholesterol level 09/16/2013   Trigger thumb of left hand 09/16/2013   Knee osteoarthritis 09/16/2013   HTN (hypertension), benign 09/16/2013   IDA (iron deficiency anemia) 09/16/2013   Asthma 09/16/2013   Unspecified vitamin D deficiency 09/16/2013   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    Past Surgical History:  Procedure Laterality Date   [place]   COLONOSCOPY     COLONOSCOPY WITH PROPOFOL N/A 10/02/2015   Procedure: COLONOSCOPY WITH PROPOFOL;  Surgeon: Ladene Artist, MD;  Location: [facility];  Service: Endoscopy;  Laterality: N/A;   laparoscopic sleeve gastrectomy N/A    Surgeon- Laurette Schimke Teppara   PARTIAL COLECTOMY  2009   POLYPECTOMY     TUBAL LIGATION  1989    OB History     Gravida  3   Para  3   Term  3   Preterm      AB      Living         SAB      IAB      Ectopic      Multiple      Live Births  3            Home Medications    Prior to Admission medications   Medication Sig Start Date End Date Taking? Authorizing Provider  methylPREDNISolone (MEDROL DOSEPAK) 4 MG TBPK tablet tad 01/22/23  Yes Raylene Everts, MD  traMADol (ULTRAM) 50 MG tablet Take 1 tablet (50 mg total) by mouth every 6 (six) hours as needed. 01/22/23  Yes Raylene Everts, MD  acetaminophen (TYLENOL) 500  MG tablet Take 500 mg by mouth every 6 (six) hours as needed.    [provider]  albuterol (PROVENTIL) (2.5 MG/3ML) 0.083% nebulizer solution Take 3 mLs (2.5 mg total) by nebulization every 4 (four) hours as needed for wheezing or shortness of breath. 10/07/21   Hali Marry, MD  albuterol (VENTOLIN HFA) 108 (90 Base) MCG/ACT inhaler Inhale 2 puffs into the lungs every 6 (six) hours as needed for wheezing or shortness of breath. 10/07/21   Hali Marry, MD  cetirizine (ZYRTEC) 10 MG tablet Take 10 mg by mouth daily.     [provider]  Cholecalciferol (VITAMIN D3 PO) Take by mouth.    [provider]  fluticasone (FLONASE) 50 MCG/ACT nasal spray Place 2 sprays into both nostrils daily. 10/07/21   Hali Marry, MD  Multiple Vitamin (MULTIVITAMIN) tablet Take 1 tablet by mouth daily.    [provider]  ondansetron (ZOFRAN) 4 MG tablet Take 1 tablet (4 mg total) by mouth every 8 (eight) hours as needed for nausea or vomiting. 01/12/23    Hali Marry, MD  Phentermine-Topiramate (QSYMIA) 7.5-46 MG CP24 Take 1 capsule by mouth every morning. 01/06/23   Hali Marry, MD    Family History Family History  Problem Relation Age of Onset   Hypertension Father    Heart attack Father    Stroke Paternal Grandmother    Hypertension Paternal Grandfather    Colon cancer Neg Hx    Stomach cancer Neg Hx    Esophageal cancer Neg Hx     Social History Social History   Tobacco Use   Smoking status: Never   Smokeless tobacco: Never  Vaping Use   Vaping Use: Never used  Substance Use Topics   Alcohol use: No    Alcohol/week: 0.0 standard drinks of alcohol   Drug use: No     Allergies   Augmentin [amoxicillin-pot clavulanate]   Review of Systems Review of Systems See HPI  Physical Exam Triage Vital Signs ED Triage Vitals  Enc Vitals Group     BP 01/22/23 1223 114/73     Pulse Rate 01/22/23 1223 95     Resp 01/22/23 1223 20     Temp 01/22/23 1223 98.9 F (37.2 C)     Temp Source 01/22/23 1223 Oral     SpO2 01/22/23 1223 97 %     Weight 01/22/23 1220 239 lb (108.4 kg)     Height 01/22/23 1220 5\' 1"  (1.549 m)     Head Circumference --      Peak Flow --      Pain Score 01/22/23 1219 8     Pain Loc --      Pain Edu? --      Excl. in [place]? --    No data found.  Updated Vital Signs BP 114/73 (BP Location: Right Arm)   Pulse 95   Temp 98.9 F (37.2 C) (Oral)   Resp 20   Ht 5\' 1"  (1.549 m)   Wt 108.4 kg   SpO2 97%   BMI 45.16 kg/m     Physical Exam Constitutional:      General: She is not in acute distress.    Appearance: She is well-developed. She is obese.  HENT:     Head: Normocephalic and atraumatic.  Eyes:     Conjunctiva/sclera: Conjunctivae normal.     Pupils: Pupils are equal, round, and reactive to light.  Cardiovascular:     Rate  and Rhythm: Normal rate.  Pulmonary:     Effort: Pulmonary effort is normal. No respiratory distress.  Abdominal:     General: There is no  distension.     Palpations: Abdomen is soft.  Musculoskeletal:        General: Tenderness present. Normal range of motion.     Cervical back: Normal range of motion.     Comments: Tenderness localized to the right SI joint.  Straight leg raise is negative bilaterally.  Reflexes are symmetric.  Strength are symmetric.  Skin:    General: Skin is warm and dry.  Neurological:     Mental Status: She is alert.     Gait: Gait abnormal.      UC Treatments / Results  Labs (all labs ordered are listed, but only abnormal results are displayed) Labs Reviewed - No data to display  EKG   Radiology No results found.  Procedures Procedures (including critical care time)  Medications Ordered in UC Medications - No data to display  Initial Impression / Assessment and Plan / UC Course  I have reviewed the triage vital signs and the nursing notes.  Pertinent labs & imaging results that were available during my care of the patient were reviewed by me and considered in my medical decision making (see chart for details).     Final Clinical Impressions(s) / UC Diagnoses   Final diagnoses:  Sacroiliac strain, initial encounter     Discharge Instructions      Take Medrol Dosepak as directed Use ice or heat to painful area Reduce activity while back is painful Take tramadol as needed for pain See your doctor or Dr. Dianah Field if you fail to improve by next week     ED Prescriptions     Medication Sig Dispense Auth. Provider   methylPREDNISolone (MEDROL DOSEPAK) 4 MG TBPK tablet tad 21 tablet Raylene Everts, MD   traMADol (ULTRAM) 50 MG tablet Take 1 tablet (50 mg total) by mouth every 6 (six) hours as needed. 15 tablet Raylene Everts, MD      I have reviewed the PDMP during this encounter.   Raylene Everts, MD 01/22/23 1245    Raylene Everts, MD 01/22/23 [phone]

## 2023-02-02 ENCOUNTER — Ambulatory Visit: Payer: TRICARE For Life (TFL) | Admitting: Family Medicine

## 2023-02-09 ENCOUNTER — Encounter: Payer: Self-pay | Admitting: Family Medicine

## 2023-02-09 ENCOUNTER — Ambulatory Visit (INDEPENDENT_AMBULATORY_CARE_PROVIDER_SITE_OTHER): Payer: Medicare Other | Admitting: Family Medicine

## 2023-02-09 VITALS — BP 122/80 | HR 100 | Ht 60.0 in | Wt 237.0 lb

## 2023-02-09 DIAGNOSIS — D508 Other iron deficiency anemias: Secondary | ICD-10-CM

## 2023-02-09 DIAGNOSIS — M170 Bilateral primary osteoarthritis of knee: Secondary | ICD-10-CM

## 2023-02-09 DIAGNOSIS — M7918 Myalgia, other site: Secondary | ICD-10-CM

## 2023-02-09 DIAGNOSIS — Z0000 Encounter for general adult medical examination without abnormal findings: Secondary | ICD-10-CM | POA: Diagnosis not present

## 2023-02-09 DIAGNOSIS — Z23 Encounter for immunization: Secondary | ICD-10-CM

## 2023-02-09 NOTE — Progress Notes (Signed)
Complete physical exam  Patient: [name]   DOB: August 20, 1956   67 y.o. Female  MRN: [id number]  Subjective:    Chief Complaint  Patient presents with   Annual Exam    [name] is a 67 y.o. female who presents today for a complete physical exam. She reports consuming a general diet.  Going to the gym 3 days per week.   She generally feels well.. She does have additional problems to discuss today.  \ She has been having some right-sided buttock pain and knee pain.  She is trying to get more mobile and active and has been going to the gym.  Most recent fall risk assessment:    02/09/2023    9:32 AM  Fall Risk   Falls in the past year? 1  Number falls in past yr: 0  Injury with Fall? 0  Risk for fall due to : History of fall(s);Orthopedic patient  Follow up Falls evaluation completed     Most recent depression screenings:    02/09/2023    9:33 AM 12/18/2022    4:16 PM  PHQ 2/9 Scores  PHQ - 2 Score 0 0        Patient Care Team: Agapito Games, Parks as PCP - General (Family Medicine)   Outpatient Medications Prior to Visit  Medication Sig   acetaminophen (TYLENOL) 500 MG tablet Take 500 mg by mouth every 6 (six) hours as needed.   albuterol (PROVENTIL) (2.5 MG/3ML) 0.083% nebulizer solution Take 3 mLs (2.5 mg total) by nebulization every 4 (four) hours as needed for wheezing or shortness of breath.   albuterol (VENTOLIN HFA) 108 (90 Base) MCG/ACT inhaler Inhale 2 puffs into the lungs every 6 (six) hours as needed for wheezing or shortness of breath.   cetirizine (ZYRTEC) 10 MG tablet Take 10 mg by mouth daily.    Cholecalciferol (VITAMIN D3 PO) Take by mouth.   fluticasone (FLONASE) 50 MCG/ACT nasal spray Place 2 sprays into both nostrils daily.   Multiple Vitamin (MULTIVITAMIN) tablet Take 1 tablet by mouth daily.   ondansetron (ZOFRAN) 4 MG tablet Take 1 tablet (4 mg total) by mouth every 8 (eight) hours as needed for nausea or vomiting.    Phentermine-Topiramate (QSYMIA) 7.5-46 MG CP24 Take 1 capsule by mouth every morning.   traMADol (ULTRAM) 50 MG tablet Take 1 tablet (50 mg total) by mouth every 6 (six) hours as needed.   [DISCONTINUED] methylPREDNISolone (MEDROL DOSEPAK) 4 MG TBPK tablet tad   No facility-administered medications prior to visit.    ROS        Objective:     BP 122/80   Pulse 100   Ht 5' (1.524 m)   Wt 237 lb (107.5 kg)   SpO2 95%   BMI 46.29 kg/m    Physical Exam Vitals and nursing note reviewed.  Constitutional:      Appearance: She is well-developed.  HENT:     Head: Normocephalic and atraumatic.     Right Ear: Tympanic membrane, ear canal and external ear normal.     Left Ear: Tympanic membrane, ear canal and external ear normal.     Nose: Nose normal.  Eyes:     Conjunctiva/sclera: Conjunctivae normal.     Pupils: Pupils are equal, round, and reactive to light.  Neck:     Thyroid: No thyromegaly.  Cardiovascular:     Rate and Rhythm: Normal rate and regular rhythm.     Heart sounds: Normal  heart sounds.  Pulmonary:     Effort: Pulmonary effort is normal.     Breath sounds: Normal breath sounds. No wheezing.  Abdominal:     General: Bowel sounds are normal.     Palpations: Abdomen is soft.  Musculoskeletal:     Cervical back: Neck supple.  Lymphadenopathy:     Cervical: No cervical adenopathy.  Skin:    General: Skin is warm and dry.  Neurological:     Mental Status: She is alert and oriented to person, place, and time.  Psychiatric:        Mood and Affect: Mood normal.        Behavior: Behavior normal.      No results found for any visits on 02/09/23.     Assessment & Plan:    Routine Health Maintenance and Physical Exam  Immunization History  Administered Date(s) Administered   Fluad Quad(high Dose 65+) 10/13/2022   Influenza,inj,Quad PF,6+ Mos 08/05/2017, 10/31/2019, 10/07/2021   Influenza,trivalent, recombinat, inj, PF 08/02/2012    Influenza-Unspecified 08/02/2012, 08/05/2017, 08/21/2018   PFIZER(Purple Top)SARS-COV-2 Vaccination 02/10/2020, 03/03/2020, 10/12/2020   PNEUMOCOCCAL CONJUGATE-20 02/09/2023   Pneumococcal Polysaccharide-23 06/30/2018, 08/21/2018   Tdap 08/02/2014   Zoster Recombinat (Shingrix) 10/31/2019, 06/01/2020    Health Maintenance  Topic Date Due   COVID-19 Vaccine (4 - 2023-24 season) 02/25/2024 (Originally 07/04/2022)   INFLUENZA VACCINE  06/04/2023   Medicare Annual Wellness (AWV)  11/27/2023   MAMMOGRAM  06/23/2024   DTaP/Tdap/Td (2 - Td or Tdap) 08/02/2024   COLONOSCOPY (Pts 45-11yrs Insurance coverage will need to be confirmed)  05/10/2026   Pneumonia Vaccine 20+ Years old  Completed   DEXA SCAN  Completed   Hepatitis C Screening  Completed   Zoster Vaccines- Shingrix  Completed   HPV VACCINES  Aged Out    Discussed health benefits of physical activity, and encouraged her to engage in regular exercise appropriate for her age and condition.  Problem List Items Addressed This Visit       Musculoskeletal and Integument   Knee osteoarthritis   Relevant Orders   Ambulatory referral to Physical Therapy     Other   IDA (iron deficiency anemia)   Relevant Orders   CBC   Other Visit Diagnoses     Wellness examination    -  Primary   Relevant Orders   COMPLETE METABOLIC PANEL WITH GFR   Lipid Panel w/reflex Direct LDL   CBC   TSH   Right buttock pain       Relevant Orders   Ambulatory referral to Physical Therapy      Return in about 1 year (around 02/09/2024) for Wellness Exam.   Keep up a regular exercise program and make sure you are eating a healthy diet Try to eat 4 servings of dairy a day, or if you are lactose intolerant take a calcium with vitamin D daily.  Your vaccines are up to date.    [name]

## 2023-02-10 LAB — CBC
HCT: 48.4 % — ABNORMAL HIGH (ref 35.0–45.0)
Hemoglobin: 15.1 g/dL (ref 11.7–15.5)
MCH: 24.3 pg — ABNORMAL LOW (ref 27.0–33.0)
MCHC: 31.2 g/dL — ABNORMAL LOW (ref 32.0–36.0)
MCV: 77.8 fL — ABNORMAL LOW (ref 80.0–100.0)
MPV: 10 fL (ref 7.5–12.5)
Platelets: 277 10*3/uL (ref 140–400)
RBC: 6.22 10*6/uL — ABNORMAL HIGH (ref 3.80–5.10)
RDW: 15.7 % — ABNORMAL HIGH (ref 11.0–15.0)
WBC: 7.2 10*3/uL (ref 3.8–10.8)

## 2023-02-10 LAB — COMPLETE METABOLIC PANEL WITH GFR
AG Ratio: 1.4 (calc) (ref 1.0–2.5)
ALT: 10 U/L (ref 6–29)
AST: 15 U/L (ref 10–35)
Albumin: 4 g/dL (ref 3.6–5.1)
Alkaline phosphatase (APISO): 100 U/L (ref 37–153)
BUN: 12 mg/dL (ref 7–25)
CO2: 26 mmol/L (ref 20–32)
Calcium: 9.1 mg/dL (ref 8.6–10.4)
Chloride: 106 mmol/L (ref 98–110)
Creat: 0.83 mg/dL (ref 0.50–1.05)
Globulin: 2.8 g/dL (calc) (ref 1.9–3.7)
Glucose, Bld: 87 mg/dL (ref 65–99)
Potassium: 4.2 mmol/L (ref 3.5–5.3)
Sodium: 140 mmol/L (ref 135–146)
Total Bilirubin: 1.1 mg/dL (ref 0.2–1.2)
Total Protein: 6.8 g/dL (ref 6.1–8.1)
eGFR: 78 mL/min/{1.73_m2} (ref >=60)

## 2023-02-10 LAB — LIPID PANEL W/REFLEX DIRECT LDL
Cholesterol: 220 mg/dL — ABNORMAL HIGH (ref <200)
HDL: 69 mg/dL (ref >=50)
LDL Cholesterol (Calc): 132 mg/dL (calc) — ABNORMAL HIGH
Non-HDL Cholesterol (Calc): 151 mg/dL (calc) — ABNORMAL HIGH (ref <130)
Total CHOL/HDL Ratio: 3.2 (calc) (ref <5.0)
Triglycerides: 87 mg/dL (ref <150)

## 2023-02-10 LAB — TSH: TSH: 0.54 mIU/L (ref 0.40–4.50)

## 2023-02-11 ENCOUNTER — Encounter: Payer: Self-pay | Admitting: Family Medicine

## 2023-02-11 DIAGNOSIS — E785 Hyperlipidemia, unspecified: Secondary | ICD-10-CM | POA: Insufficient documentation

## 2023-02-11 NOTE — Progress Notes (Signed)
Hi Debar, LDL cholesterol is elevated at 132 as well as total cholesterol.  Your HDL and triglycerides look good.  Just really encouraged her to work on healthy diet and regular exercise.  Hemoglobin is stable.  Red blood cells are still on the small side but stable over the last 14 years.  Metabolic panel and thyroid look great.  The 10-year ASCVD risk score (Arnett DK, et al., 2019) is: 9.1%   Values used to calculate the score:     Age: 67 years     Sex: Female     Is Non-Hispanic African American: Yes     Diabetic: No     Tobacco smoker: No     Systolic Blood Pressure: 122 mmHg     Is BP treated: Yes     HDL Cholesterol: 69 mg/dL     Total Cholesterol: 220 mg/dL

## 2023-02-25 ENCOUNTER — Ambulatory Visit: Payer: TRICARE For Life (TFL) | Admitting: Family Medicine

## 2023-02-26 ENCOUNTER — Other Ambulatory Visit: Payer: Self-pay

## 2023-02-26 ENCOUNTER — Ambulatory Visit: Payer: Medicare Other | Attending: Family Medicine | Admitting: Physical Therapy

## 2023-02-26 ENCOUNTER — Encounter: Payer: Self-pay | Admitting: Physical Therapy

## 2023-02-26 DIAGNOSIS — M25662 Stiffness of left knee, not elsewhere classified: Secondary | ICD-10-CM | POA: Diagnosis present

## 2023-02-26 DIAGNOSIS — R29810 Facial weakness: Secondary | ICD-10-CM

## 2023-02-26 DIAGNOSIS — M25551 Pain in right hip: Secondary | ICD-10-CM

## 2023-02-26 DIAGNOSIS — M170 Bilateral primary osteoarthritis of knee: Secondary | ICD-10-CM | POA: Insufficient documentation

## 2023-02-26 DIAGNOSIS — M25561 Pain in right knee: Secondary | ICD-10-CM | POA: Diagnosis present

## 2023-02-26 DIAGNOSIS — M25562 Pain in left knee: Secondary | ICD-10-CM | POA: Diagnosis present

## 2023-02-26 DIAGNOSIS — G8929 Other chronic pain: Secondary | ICD-10-CM | POA: Insufficient documentation

## 2023-02-26 DIAGNOSIS — M7918 Myalgia, other site: Secondary | ICD-10-CM | POA: Insufficient documentation

## 2023-02-26 DIAGNOSIS — M25661 Stiffness of right knee, not elsewhere classified: Secondary | ICD-10-CM
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
## 2023-03-02 ENCOUNTER — Encounter: Payer: Self-pay | Admitting: Physical Therapy

## 2023-03-02 ENCOUNTER — Ambulatory Visit: Payer: Medicare Other | Admitting: Physical Therapy

## 2023-03-02 DIAGNOSIS — M25661 Stiffness of right knee, not elsewhere classified: Secondary | ICD-10-CM

## 2023-03-02 DIAGNOSIS — M25561 Pain in right knee: Secondary | ICD-10-CM | POA: Diagnosis not present

## 2023-03-02 DIAGNOSIS — G8929 Other chronic pain: Secondary | ICD-10-CM

## 2023-03-02 DIAGNOSIS — M25551 Pain in right hip: Secondary | ICD-10-CM

## 2023-03-02 DIAGNOSIS — M25662 Stiffness of left knee, not elsewhere classified: Secondary | ICD-10-CM

## 2023-03-02 NOTE — Therapy (Signed)
OUTPATIENT PHYSICAL THERAPY TREATMENT   Patient Name: [name] MRN: [id number] DOB:1955-11-15, 67 y.o., female Today's Date: 03/02/2023  END OF SESSION:  PT End of Session - 03/02/23 0842     Visit Number 2    Number of Visits 13    Date for PT Re-Evaluation 04/09/23    Authorization Type MCR/Tricare    Authorization Time Period 02/26/23 to 04/09/23    PT Start Time 0806    PT Stop Time 0846    PT Time Calculation (min) 40 min    Activity Tolerance Patient tolerated treatment well    Behavior During Therapy [facility] for tasks assessed/performed              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   Blood transfusion without reported diagnosis    1980's   Colon cancer (HCC) 2009   colon   GERD (gastroesophageal reflux disease)    Hiatal hernia    Hypertension    Past Surgical History:  Procedure Laterality Date   CESAREAN SECTION     1982, 1988, 1989   CHOLECYSTECTOMY  1980   COLONOSCOPY     COLONOSCOPY WITH PROPOFOL N/A 10/02/2015   Procedure: COLONOSCOPY WITH PROPOFOL;  Surgeon: Christina Dare, MD;  Location: [facility];  Service: Endoscopy;  Laterality: N/A;   laparoscopic sleeve gastrectomy N/A    Surgeon- [name]   PARTIAL COLECTOMY  2009   POLYPECTOMY     TUBAL LIGATION  1989   Patient Active Problem List   Diagnosis Date Noted   Hyperlipidemia 02/11/2023   Encounter for weight management 12/18/2022   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   Sebaceous cyst 11/02/2019   OSA (obstructive sleep apnea) 03/22/2018   Trochanteric bursitis of right hip 05/14/2017   Morbid obesity (HCC) 08/01/2016   IFG (impaired fasting glucose) 07/14/2016   Primary osteoarthritis of both hands 08/03/2015   Elevated LDL cholesterol level 09/16/2013   Trigger thumb of left hand 09/16/2013   Knee osteoarthritis 09/16/2013   HTN (hypertension), benign  09/16/2013   IDA (iron deficiency anemia) 09/16/2013   Asthma 09/16/2013   Unspecified vitamin D deficiency 09/16/2013   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    PCP: Christina Games, MD  REFERRING PROVIDER: Agapito Games, MD  REFERRING DIAG:  M79.18 (ICD-10-CM) - Right buttock pain  M17.0 (ICD-10-CM) - Primary osteoarthritis of both knees    THERAPY DIAG:  Chronic pain of right knee  Stiffness of right knee, not elsewhere classified  Chronic pain of left knee  Stiffness of left knee, not elsewhere classified  Pain in right hip  Rationale for Evaluation and Treatment: Rehabilitation  ONSET DATE: 02/09/2023  SUBJECTIVE:   SUBJECTIVE STATEMENT:  I haven't really done anything this morning.  PERTINENT HISTORY:  PAIN:  Are you having pain? Very unclear "it comes and goes, maybe 2-3/10 I have a high tolerance for pain"   PRECAUTIONS: ICD/Pacemaker  WEIGHT BEARING RESTRICTIONS: No  FALLS:  Has patient fallen in last 6 months? No  LIVING ENVIRONMENT: Lives with: lives with their spouse Lives in: House/apartment Stairs: no STE, 7 steps up, 7 steps down split level home  Has following equipment at home: Single point cane  OCCUPATION: nurse case manager   PLOF: Independent, Independent with basic ADLs, Independent with gait, and Independent with  transfers  PATIENT GOALS: know what I can do to prevent or reduce pain flares   NEXT MD VISIT: Dr. Linford Parks May 8th   OBJECTIVE:   DIAGNOSTIC FINDINGS:   PATIENT SURVEYS:  LEFS 13/80  COGNITION: Overall cognitive status: Within functional limits for tasks assessed     SENSATION: Not tested no complaints   EDEMA:    MUSCLE LENGTH:  HS WNL B  Piriformis L WNL, R moderate limitation   POSTURE:   PALPATION:   LOWER EXTREMITY ROM:  Active ROM Right eval Left eval  Hip flexion    Hip extension    Hip abduction    Hip adduction    Hip internal rotation    Hip external  rotation    Knee flexion 60* 75*  Knee extension 0* 0*  Ankle dorsiflexion    Ankle plantarflexion    Ankle inversion    Ankle eversion     (Blank rows = not tested)  LOWER EXTREMITY MMT:  MMT Right eval Left eval  Hip flexion 3- 3-  Hip extension    Hip abduction 3 4  Hip adduction    Hip internal rotation    Hip external rotation    Knee flexion 4 4  Knee extension 4+ 4+  Ankle dorsiflexion 4+ 4+  Ankle plantarflexion    Ankle inversion    Ankle eversion     (Blank rows = not tested)  LOWER EXTREMITY SPECIAL TESTS:    FUNCTIONAL TESTS:    GAIT: Distance walked: in clinic distances  Assistive device utilized: Single point cane Level of assistance: Modified independence Comments: antalgic gait pattern with wide BOS, limited stance times and step lengths    TODAY'S TREATMENT:                                                                                                                              DATE: 03/02/23 Attempted bike (unable to do) Seated marching x 10 ea (resistance too difficult) Seated hip ABD RTB 2x10 Seated knee extension RTB 2x10 Supine HS stretch with strap x 30 sec Supine ITB stretch with strap x 30 sec Gait: adjusted SPC to correct height and amb 80 feet with cane in L UE Manual: IASTM and STM, Deep MFR to right gluteals, piriformis; self   Eval   Objective measures/appropriate education   THerEx  Nustep L2 x6 minutes BLEs only     PATIENT EDUCATION:  Education details: exam findings, POC, HEP  Person educated: Patient Education method: Programmer, multimedia, Demonstration, and Handouts Education comprehension: verbalized understanding, returned demonstration, and needs further education  HOME EXERCISE PROGRAM: Access Code: [email or web address] URL: [email or web address] Date: 02/26/2023 Prepared by: Christina Hai  Exercises - Sitting Knee Extension with Resistance  - 1 x daily - 7 x weekly - 3 sets - 10 reps - Seated March with  Resistance  - 1 x daily - 7 x weekly - 3 sets - 10 reps - Seated Hip Abduction with  Resistance  - 1 x daily - 7 x weekly - 3 sets - 10 reps  ASSESSMENT:  CLINICAL IMPRESSION: [name] did not tolerate the bike due to her R knee pain (Nustep not available). Cane adjusted to correct height and gait worked on with cane in L UE. Limited with TE due to pain today. Good response to STM and IASTM. Would benefit from DN to hip next visit.   OBJECTIVE IMPAIRMENTS: Abnormal gait, difficulty walking, decreased ROM, decreased strength, increased fascial restrictions, impaired flexibility, obesity, and pain.   ACTIVITY LIMITATIONS: standing, squatting, stairs, transfers, and locomotion level  PARTICIPATION LIMITATIONS: driving, shopping, community activity, occupation, and yard work  PERSONAL FACTORS: Age, Behavior pattern, Education, Fitness, Past/current experiences, Social background, and Time since onset of injury/illness/exacerbation are also affecting patient's functional outcome.   REHAB POTENTIAL: Fair chronicity of pain, obesity   CLINICAL DECISION MAKING: Stable/uncomplicated  EVALUATION COMPLEXITY: Low   GOALS: Goals reviewed with patient? Yes  SHORT TERM GOALS: Target date: 03/19/2023   Will be compliant with appropriate progressive HEP  Baseline: Goal status: INITIAL  2.  Knee flexion ROM to improve by 15 degrees B  Baseline:  Goal status: INITIAL  3.  R knee extension ROM to be no more than 5 degrees  Baseline:  Goal status: INITIAL  4.  Piriformis mm spasm and flexibility impairment to be resolved  Baseline:  Goal status: INITIAL   LONG TERM GOALS: Target date: 04/09/2023    MMT to improve by 1 grade in all weak groups  Baseline:  Goal status: INITIAL  2.  Pain to be no more than 2/10 at worst in B knees and R piriformis/glute  Baseline:  Goal status: INITIAL  3.  Will tolerate advanced HEP vs gym level programming to maintain functional status and prevent  recurrence of severe pain  Baseline:  Goal status: INITIAL  4.  LEFS score to improve by at least 15 points to show improvement in functional status  Baseline:  Goal status: INITIAL    PLAN:  PT FREQUENCY: 2x/week  PT DURATION: 6 weeks  PLANNED INTERVENTIONS: Therapeutic exercises, Therapeutic activity, Neuromuscular re-education, Balance training, Gait training, Patient/Family education, Self Care, Joint mobilization, Stair training, DME instructions, Aquatic Therapy, Dry Needling, Electrical stimulation, Spinal mobilization, Cryotherapy, Moist heat, Taping, Ultrasound, Ionotophoresis 4mg /ml Dexamethasone, Manual therapy, and Re-evaluation  PLAN FOR NEXT SESSION: split between land and water therapies; knee ROM, general strength and gait training, DN to hip  Alexiya Franqui, PT 03/02/2023 12:18 PM

## 2023-03-03 NOTE — Therapy (Addendum)
OUTPATIENT PHYSICAL THERAPY TREATMENT   Patient Name: [name] MRN: [id number] DOB:September 03, 1956, 67 y.o., female Today's Date: 03/04/2023  END OF SESSION:  PT End of Session - 03/04/23 0807     Visit Number 3    Number of Visits 13    Date for PT Re-Evaluation 04/09/23    Authorization Type MCR/Tricare    Authorization Time Period 02/26/23 to 04/09/23    PT Start Time 0807    PT Stop Time 0845    PT Time Calculation (min) 38 min    Activity Tolerance Patient tolerated treatment well    Behavior During Therapy [facility] for tasks assessed/performed              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   Blood transfusion without reported diagnosis    1980's   Colon cancer (HCC) 2009   colon   GERD (gastroesophageal reflux disease)    Hiatal hernia    Hypertension    Past Surgical History:  Procedure Laterality Date   CESAREAN SECTION     1982, 1988, 1989   CHOLECYSTECTOMY  1980   COLONOSCOPY     COLONOSCOPY WITH PROPOFOL N/A 10/02/2015   Procedure: COLONOSCOPY WITH PROPOFOL;  Surgeon: Meryl Dare, MD;  Location: [facility];  Service: Endoscopy;  Laterality: N/A;   laparoscopic sleeve gastrectomy N/A    Surgeon- Greig Right Teppara   PARTIAL COLECTOMY  2009   POLYPECTOMY     TUBAL LIGATION  1989   Patient Active Problem List   Diagnosis Date Noted   Hyperlipidemia 02/11/2023   Encounter for weight management 12/18/2022   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   Sebaceous cyst 11/02/2019   OSA (obstructive sleep apnea) 03/22/2018   Trochanteric bursitis of right hip 05/14/2017   Morbid obesity (HCC) 08/01/2016   IFG (impaired fasting glucose) 07/14/2016   Primary osteoarthritis of both hands 08/03/2015   Elevated LDL cholesterol level 09/16/2013   Trigger thumb of left hand 09/16/2013   Knee osteoarthritis 09/16/2013   HTN (hypertension), benign  09/16/2013   IDA (iron deficiency anemia) 09/16/2013   Asthma 09/16/2013   Unspecified vitamin D deficiency 09/16/2013   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    PCP: Agapito Games, MD  REFERRING PROVIDER: Agapito Games, MD  REFERRING DIAG:  M79.18 (ICD-10-CM) - Right buttock pain  M17.0 (ICD-10-CM) - Primary osteoarthritis of both knees    THERAPY DIAG:  Chronic pain of right knee  Stiffness of right knee, not elsewhere classified  Chronic pain of left knee  Stiffness of left knee, not elsewhere classified  Rationale for Evaluation and Treatment: Rehabilitation  ONSET DATE: 02/09/2023  SUBJECTIVE:   SUBJECTIVE STATEMENT: The tissue work felt really good. It was looser.  PERTINENT HISTORY:  PAIN:  Are you having pain? 5/10  PRECAUTIONS: ICD/Pacemaker  WEIGHT BEARING RESTRICTIONS: No  FALLS:  Has patient fallen in last 6 months? No  LIVING ENVIRONMENT: Lives with: lives with their spouse Lives in: House/apartment Stairs: no STE, 7 steps up, 7 steps down split level home  Has following equipment at home: Single point cane  OCCUPATION: nurse case manager   PLOF: Independent, Independent with basic ADLs, Independent with gait, and Independent with transfers  PATIENT GOALS: know what I can do to prevent or reduce pain flares   NEXT MD  VISIT: Dr. Linford Arnold May 8th   OBJECTIVE:   DIAGNOSTIC FINDINGS:   PATIENT SURVEYS:  LEFS 13/80  COGNITION: Overall cognitive status: Within functional limits for tasks assessed     SENSATION: Not tested no complaints   EDEMA:    MUSCLE LENGTH:  HS WNL B  Piriformis L WNL, R moderate limitation   POSTURE:   PALPATION:   LOWER EXTREMITY ROM:  Active ROM Right eval Left eval  Hip flexion    Hip extension    Hip abduction    Hip adduction    Hip internal rotation    Hip external rotation    Knee flexion 60* 75*  Knee extension 0* 0*  Ankle dorsiflexion    Ankle  plantarflexion    Ankle inversion    Ankle eversion     (Blank rows = not tested)  LOWER EXTREMITY MMT:  MMT Right eval Left eval  Hip flexion 3- 3-  Hip extension    Hip abduction 3 4  Hip adduction    Hip internal rotation    Hip external rotation    Knee flexion 4 4  Knee extension 4+ 4+  Ankle dorsiflexion 4+ 4+  Ankle plantarflexion    Ankle inversion    Ankle eversion     (Blank rows = not tested)  LOWER EXTREMITY SPECIAL TESTS:    FUNCTIONAL TESTS:    GAIT: Distance walked: in clinic distances  Assistive device utilized: Single point cane Level of assistance: Modified independence Comments: antalgic gait pattern with wide BOS, limited stance times and step lengths    TODAY'S TREATMENT:                                                                                                                              DATE: 03/04/23 Nustep L5 x 6.5 min (500 steps) Trigger Point Dry-Needling  Skilled palpation and monitoring of soft tissues during DN STM to R gluteals, piriformis and lateral quads Treatment instructions: Expect mild to moderate muscle soreness. S/S of pneumothorax if dry needled over a lung field, and to seek immediate medical attention should they occur. Patient verbalized understanding of these instructions and education. Patient Consent Given: Yes Education handout provided: Yes Muscles treated: R gluteals, piriformis, lateral quads Electrical stimulation performed: No Parameters: N/A Treatment response/outcome: Twitch Response Elicited and Palpable Increase in Muscle Length   03/02/23 Attempted bike (unable to do) Seated marching x 10 ea (resistance too difficult) Seated hip ABD RTB 2x10 Seated knee extension RTB 2x10 Supine HS stretch with strap x 30 sec Supine ITB stretch with strap x 30 sec Gait: adjusted SPC to correct height and amb 80 feet with cane in L UE Manual: IASTM and STM, Deep MFR to right gluteals, piriformis; self   Eval    Objective measures/appropriate education   THerEx  Nustep L2 x6 minutes BLEs only     PATIENT EDUCATION:  Education details: DN education and aftercare Person educated: Patient Education  method: Explanation, Demonstration, and Handouts Education comprehension: verbalized understanding, returned demonstration, and needs further education  HOME EXERCISE PROGRAM: Access Code: [email or web address] URL: [email or web address] Date:03/04/2023 Prepared [email or web address] Isiaih Hollenbach  Exercises - Sitting Knee Extension with Resistance  - 1 x daily - 7 x weekly - 3 sets - 10 reps - Seated March with Resistance  - 1 x daily - 7 x weekly - 3 sets - 10 reps - Seated Hip Abduction with Resistance  - 1 x daily - 7 x weekly - 3 sets - 10 reps - DN handout  ASSESSMENT:  CLINICAL IMPRESSION: Uva reports relief with STM last visit, so initial trial of DN done today with very positive results. Patient reporting decreased pain and tension with gait immediately after. She may benefit from additional DN to Left LE if results stay positive.   OBJECTIVE IMPAIRMENTS: Abnormal gait, difficulty walking, decreased ROM, decreased strength, increased fascial restrictions, impaired flexibility, obesity, and pain.   ACTIVITY LIMITATIONS: standing, squatting, stairs, transfers, and locomotion level  PARTICIPATION LIMITATIONS: driving, shopping, community activity, occupation, and yard work  PERSONAL FACTORS: Age, Behavior pattern, Education, Fitness, Past/current experiences, Social background, and Time since onset of injury/illness/exacerbation are also affecting patient's functional outcome.   REHAB POTENTIAL: Fair chronicity of pain, obesity   CLINICAL DECISION MAKING: Stable/uncomplicated  EVALUATION COMPLEXITY: Low   GOALS: Goals reviewed with patient? Yes  SHORT TERM GOALS: Target date: 03/19/2023   Will be compliant with appropriate progressive HEP  Baseline: Goal status: INITIAL  2.  Knee flexion  ROM to improve by 15 degrees B  Baseline:  Goal status: INITIAL  3.  R knee extension ROM to be no more than 5 degrees  Baseline:  Goal status: INITIAL  4.  Piriformis mm spasm and flexibility impairment to be resolved  Baseline:  Goal status: INITIAL   LONG TERM GOALS: Target date: 04/09/2023    MMT to improve by 1 grade in all weak groups  Baseline:  Goal status: INITIAL  2.  Pain to be no more than 2/10 at worst in B knees and R piriformis/glute  Baseline:  Goal status: INITIAL  3.  Will tolerate advanced HEP vs gym level programming to maintain functional status and prevent recurrence of severe pain  Baseline:  Goal status: INITIAL  4.  LEFS score to improve by at least 15 points to show improvement in functional status  Baseline:  Goal status: INITIAL    PLAN:  PT FREQUENCY: 2x/week  PT DURATION: 6 weeks  PLANNED INTERVENTIONS: Therapeutic exercises, Therapeutic activity, Neuromuscular re-education, Balance training, Gait training, Patient/Family education, Self Care, Joint mobilization, Stair training, DME instructions, Aquatic Therapy, Dry Needling, Electrical stimulation, Spinal mobilization, Cryotherapy, Moist heat, Taping, Ultrasound, Ionotophoresis 4mg /ml Dexamethasone, Manual therapy, and Re-evaluation  PLAN FOR NEXT SESSION: split between land and water therapies; knee ROM, general strength and gait training, DN to hip  Adilyn Humes, PT 03/04/2023 8:47 AM

## 2023-03-04 ENCOUNTER — Ambulatory Visit: Payer: Medicare Other | Attending: Family Medicine | Admitting: Physical Therapy

## 2023-03-04 ENCOUNTER — Encounter: Payer: Self-pay | Admitting: Physical Therapy

## 2023-03-04 DIAGNOSIS — M25562 Pain in left knee: Secondary | ICD-10-CM | POA: Insufficient documentation

## 2023-03-04 DIAGNOSIS — M25661 Stiffness of right knee, not elsewhere classified: Secondary | ICD-10-CM

## 2023-03-04 DIAGNOSIS — M25561 Pain in right knee: Secondary | ICD-10-CM | POA: Insufficient documentation

## 2023-03-04 DIAGNOSIS — M25551 Pain in right hip: Secondary | ICD-10-CM | POA: Diagnosis present

## 2023-03-04 DIAGNOSIS — R29810 Facial weakness: Secondary | ICD-10-CM | POA: Insufficient documentation

## 2023-03-04 DIAGNOSIS — M25662 Stiffness of left knee, not elsewhere classified: Secondary | ICD-10-CM

## 2023-03-04 DIAGNOSIS — G8929 Other chronic pain: Secondary | ICD-10-CM | POA: Insufficient documentation

## 2023-03-08 NOTE — Therapy (Signed)
OUTPATIENT PHYSICAL THERAPY TREATMENT   Patient Name: [name] MRN: [id number] DOB:04-06-1956, 67 y.o., female Today's Date: 03/09/2023  END OF SESSION:  PT End of Session - 03/09/23 0802     Visit Number 4    Date for PT Re-Evaluation 04/09/23    Authorization Type MCR/Tricare    Authorization Time Period 02/26/23 to 04/09/23    PT Start Time 0800    PT Stop Time 0844    PT Time Calculation (min) 44 min    Activity Tolerance Patient tolerated treatment well    Behavior During Therapy [facility] for tasks assessed/performed              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   Blood transfusion without reported diagnosis    1980's   Colon cancer (HCC) 2009   colon   GERD (gastroesophageal reflux disease)    Hiatal hernia    Hypertension    Past Surgical History:  Procedure Laterality Date   CESAREAN SECTION     1982, 1988, 1989   CHOLECYSTECTOMY  1980   COLONOSCOPY     COLONOSCOPY WITH PROPOFOL N/A 10/02/2015   Procedure: COLONOSCOPY WITH PROPOFOL;  Surgeon: Meryl Dare, MD;  Location: [facility];  Service: Endoscopy;  Laterality: N/A;   laparoscopic sleeve gastrectomy N/A    Surgeon- Greig Right Teppara   PARTIAL COLECTOMY  2009   POLYPECTOMY     TUBAL LIGATION  1989   Patient Active Problem List   Diagnosis Date Noted   Hyperlipidemia 02/11/2023   Encounter for weight management 12/18/2022   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   Sebaceous cyst 11/02/2019   OSA (obstructive sleep apnea) 03/22/2018   Trochanteric bursitis of right hip 05/14/2017   Morbid obesity (HCC) 08/01/2016   IFG (impaired fasting glucose) 07/14/2016   Primary osteoarthritis of both hands 08/03/2015   Elevated LDL cholesterol level 09/16/2013   Trigger thumb of left hand 09/16/2013   Knee osteoarthritis 09/16/2013   HTN (hypertension), benign 09/16/2013   IDA (iron  deficiency anemia) 09/16/2013   Asthma 09/16/2013   Unspecified vitamin D deficiency 09/16/2013   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    PCP: Agapito Games, MD  REFERRING PROVIDER: Agapito Games, MD  REFERRING DIAG:  M79.18 (ICD-10-CM) - Right buttock pain  M17.0 (ICD-10-CM) - Primary osteoarthritis of both knees    THERAPY DIAG:  Chronic pain of right knee  Stiffness of right knee, not elsewhere classified  Chronic pain of left knee  Stiffness of left knee, not elsewhere classified  Pain in right hip  Rationale for Evaluation and Treatment: Rehabilitation  ONSET DATE: 02/09/2023  SUBJECTIVE:   SUBJECTIVE STATEMENT: My hip and knees felt so much better after the needling. The second day I had pain in upper back.  PERTINENT HISTORY:  PAIN:  Are you having pain? 3/10  bil knees  PRECAUTIONS: ICD/Pacemaker  WEIGHT BEARING RESTRICTIONS: No  FALLS:  Has patient fallen in last 6 months? No  LIVING ENVIRONMENT: Lives with: lives with their spouse Lives in: House/apartment Stairs: no STE, 7 steps up, 7 steps down split level home  Has following equipment at home: Single point cane  OCCUPATION: nurse case manager   PLOF: Independent, Independent with basic ADLs, Independent with gait, and Independent with transfers  PATIENT GOALS: know what I  can do to prevent or reduce pain flares   NEXT MD VISIT: Dr. Linford Arnold May 8th   OBJECTIVE:   DIAGNOSTIC FINDINGS:   PATIENT SURVEYS:  LEFS 13/80  COGNITION: Overall cognitive status: Within functional limits for tasks assessed     SENSATION: Not tested no complaints   MUSCLE LENGTH:  HS WNL B  Piriformis L WNL, R moderate limitation   LOWER EXTREMITY ROM:  Active ROM Right eval Left eval  Hip flexion    Knee flexion 60* 75*  Knee extension 0* 0*   (Blank rows = not tested)  LOWER EXTREMITY MMT:  MMT Right eval Left eval  Hip flexion 3- 3-  Hip extension    Hip  abduction 3 4  Hip adduction    Hip internal rotation    Hip external rotation    Knee flexion 4 4  Knee extension 4+ 4+  Ankle dorsiflexion 4+ 4+   (Blank rows = not tested)  GAIT: Distance walked: in clinic distances  Assistive device utilized: Single point cane Level of assistance: Modified independence Comments: antalgic gait pattern with wide BOS, limited stance times and step lengths    TODAY'S TREATMENT:                                                                                                                              DATE:  03/09/23 Nustep L5 x 6 min (640 steps) Seated marching x 10 ea (feet on 4 inch step) Seated hip ABD RTB 2x10, 1x5 Seated knee flexion RTB loop 2x10 Sit to stand x 5 Standing step ups R onto aerobic base x 10 (pt does not want to bend knee) 8 inch step toe taps x 10 each to encourage knee flexion 8 inch step lunge stretch x 10 ea side Supine HS stretch with strap x 30 sec bil Supine ITB stretch with strap x 30 sec bil    03/04/23 Nustep L5 x 6.5 min (500 steps) Trigger Point Dry-Needling  Skilled palpation and monitoring of soft tissues during DN STM to R gluteals, piriformis and lateral quads Treatment instructions: Expect mild to moderate muscle soreness. S/S of pneumothorax if dry needled over a lung field, and to seek immediate medical attention should they occur. Patient verbalized understanding of these instructions and education. Patient Consent Given: Yes Education handout provided: Yes Muscles treated: R gluteals, piriformis, lateral quads Electrical stimulation performed: No Parameters: N/A Treatment response/outcome: Twitch Response Elicited and Palpable Increase in Muscle Length   03/02/23 Attempted bike (unable to do) Seated marching x 10 ea (resistance too difficult) Seated hip ABD RTB 2x10 Seated knee extension RTB 2x10 Supine HS stretch with strap x 30 sec Supine ITB stretch with strap x 30 sec Gait: adjusted SPC to  correct height and amb 80 feet with cane in L UE Manual: IASTM and STM, Deep MFR to right gluteals, piriformis; self   Eval   Objective measures/appropriate education   THerEx  Nustep  L2 x6 minutes BLEs only     PATIENT EDUCATION:  Education details: DN education and aftercare Person educated: Patient Education method: Explanation, Demonstration, and Handouts Education comprehension: verbalized understanding, returned demonstration, and needs further education  HOME EXERCISE PROGRAM: Access Code: [email or web address] URL: [email or web address] Date: 03/09/2023 Prepared by: Raynelle Fanning  Exercises - Sitting Knee Extension with Resistance  - 1 x daily - 7 x weekly - 3 sets - 10 reps - Seated March with Resistance  - 1 x daily - 7 x weekly - 3 sets - 10 reps - Seated Hip Abduction with Resistance  - 1 x daily - 7 x weekly - 3 sets - 10 reps - Sit to Stand  - 3 x daily - 7 x weekly - 1-2 sets - 10 reps - Standing Hip Abduction with Resistance at Ankles and Counter Support  - 1 x daily - 7 x weekly - 3 sets - 10 reps - Standing Hip Extension with Resistance at Ankles and Counter Support  - 1 x daily - 7 x weekly - 3 sets - 10 reps - Standing Knee Flexion Stretch on Step  - 1 x daily - 7 x weekly - 2 sets - 10 reps  ASSESSMENT:  CLINICAL IMPRESSION: Davasia had increased tolerance to TE today after DN last session. She needs cueing to actively bend her right knee with step exercises. She is compensating with forward trunk lean and hip extension.   OBJECTIVE IMPAIRMENTS: Abnormal gait, difficulty walking, decreased ROM, decreased strength, increased fascial restrictions, impaired flexibility, obesity, and pain.   ACTIVITY LIMITATIONS: standing, squatting, stairs, transfers, and locomotion level  PARTICIPATION LIMITATIONS: driving, shopping, community activity, occupation, and yard work  PERSONAL FACTORS: Age, Behavior pattern, Education, Fitness, Past/current experiences, Social  background, and Time since onset of injury/illness/exacerbation are also affecting patient's functional outcome.   REHAB POTENTIAL: Fair chronicity of pain, obesity   CLINICAL DECISION MAKING: Stable/uncomplicated  EVALUATION COMPLEXITY: Low   GOALS: Goals reviewed with patient? Yes  SHORT TERM GOALS: Target date: 03/19/2023   Will be compliant with appropriate progressive HEP  Baseline: Goal status: INITIAL  2.  Knee flexion ROM to improve by 15 degrees B  Baseline:  Goal status: INITIAL  3.  R knee extension ROM to be no more than 5 degrees  Baseline:  Goal status: INITIAL  4.  Piriformis mm spasm and flexibility impairment to be resolved  Baseline:  Goal status: INITIAL   LONG TERM GOALS: Target date: 04/09/2023    MMT to improve by 1 grade in all weak groups  Baseline:  Goal status: INITIAL  2.  Pain to be no more than 2/10 at worst in B knees and R piriformis/glute  Baseline:  Goal status: INITIAL  3.  Will tolerate advanced HEP vs gym level programming to maintain functional status and prevent recurrence of severe pain  Baseline:  Goal status: INITIAL  4.  LEFS score to improve by at least 15 points to show improvement in functional status  Baseline:  Goal status: INITIAL    PLAN:  PT FREQUENCY: 2x/week  PT DURATION: 6 weeks  PLANNED INTERVENTIONS: Therapeutic exercises, Therapeutic activity, Neuromuscular re-education, Balance training, Gait training, Patient/Family education, Self Care, Joint mobilization, Stair training, DME instructions, Aquatic Therapy, Dry Needling, Electrical stimulation, Spinal mobilization, Cryotherapy, Moist heat, Taping, Ultrasound, Ionotophoresis 4mg /ml Dexamethasone, Manual therapy, and Re-evaluation  PLAN FOR NEXT SESSION: split between land and water therapies; knee ROM, general strength and gait training, DN to hip  Trenton Passow, PT 03/09/2023  2:59 PM

## 2023-03-09 ENCOUNTER — Ambulatory Visit: Payer: Medicare Other | Admitting: Physical Therapy

## 2023-03-09 ENCOUNTER — Encounter: Payer: Self-pay | Admitting: Physical Therapy

## 2023-03-09 ENCOUNTER — Encounter: Payer: Self-pay | Admitting: Family Medicine

## 2023-03-09 ENCOUNTER — Ambulatory Visit (INDEPENDENT_AMBULATORY_CARE_PROVIDER_SITE_OTHER): Payer: Medicare Other | Admitting: Family Medicine

## 2023-03-09 VITALS — BP 128/77 | HR 88 | Ht 60.0 in | Wt 243.0 lb

## 2023-03-09 DIAGNOSIS — R7301 Impaired fasting glucose: Secondary | ICD-10-CM

## 2023-03-09 DIAGNOSIS — M25561 Pain in right knee: Secondary | ICD-10-CM | POA: Diagnosis not present

## 2023-03-09 DIAGNOSIS — G8929 Other chronic pain: Secondary | ICD-10-CM

## 2023-03-09 DIAGNOSIS — I10 Essential (primary) hypertension: Secondary | ICD-10-CM | POA: Diagnosis not present

## 2023-03-09 DIAGNOSIS — M25662 Stiffness of left knee, not elsewhere classified: Secondary | ICD-10-CM

## 2023-03-09 DIAGNOSIS — Z7689 Persons encountering health services in other specified circumstances: Secondary | ICD-10-CM | POA: Diagnosis not present

## 2023-03-09 DIAGNOSIS — M25661 Stiffness of right knee, not elsewhere classified: Secondary | ICD-10-CM

## 2023-03-09 DIAGNOSIS — M25551 Pain in right hip: Secondary | ICD-10-CM

## 2023-03-09 MED ORDER — CONTRAVE 8-90 MG PO TB12: ORAL_TABLET | ORAL | 1 refills | Status: DC

## 2023-03-09 NOTE — Progress Notes (Signed)
Pt reports that she hasn't been taking the Qsymia for the past 2-3 weeks because it caused severe heartburn. She would like to start on some other medication for weight loss. I advised her that she would need to reach out to her insurance company to see what they cover so we would know what to send in to her pharmacy. She voiced understanding and agreed.   Pt has also started going to PT and stated that this is helping.

## 2023-03-09 NOTE — Assessment & Plan Note (Signed)
Well controlled. Continue current regimen. Follow up in  6 mo  

## 2023-03-09 NOTE — Addendum Note (Signed)
Addended by: Nani Gasser D on: 03/09/2023 05:08 PM   Modules accepted: Level of Service

## 2023-03-09 NOTE — Assessment & Plan Note (Signed)
Last A1c was 5.6 at goal.  Continue to work on weight loss.

## 2023-03-09 NOTE — Progress Notes (Signed)
   Established Patient Office Visit  Subjective   Patient ID: [name], female    DOB: 12-18-1955  Age: 67 y.o. MRN: [id number]  Chief Complaint  Patient presents with   Hypertension    HPI  Hypertension- Pt denies chest pain, SOB, dizziness, or heart palpitations.  Taking meds as directed w/o problems.  Denies medication side effects.    Will start aquatic therapy  in June for her badck and hips.  She is going to PT regularly and just had dry needling which she has felt has been really helpful she actually got a lot of pain relief with it.  Weight management-she tried the Qsymia but unfortunately it caused severe heartburn she ended up skipping it for day just to see if it was better and it was so she ended up stopping it completely.  She admits that she was getting into a little bit more sweets lately with the stress of her pain and has gained a little bit of weight back but she is otherwise doing well.     ROS    Objective:     BP 128/77   Pulse 88   Ht 5' (1.524 m)   Wt 243 lb (110.2 kg)   SpO2 98%   BMI 47.46 kg/m     Physical Exam Vitals and nursing note reviewed.  Constitutional:      Appearance: She is well-developed.  HENT:     Head: Normocephalic and atraumatic.  Cardiovascular:     Rate and Rhythm: Normal rate and regular rhythm.     Heart sounds: Normal heart sounds.  Pulmonary:     Effort: Pulmonary effort is normal.     Breath sounds: Normal breath sounds.  Skin:    General: Skin is warm and dry.  Neurological:     Mental Status: She is alert and oriented to person, place, and time.  Psychiatric:        Behavior: Behavior normal.      No results found for any visits on 03/09/23.     The 10-year ASCVD risk score (Arnett DK, et al., 2019) is: 10.1%    Assessment & Plan:   Problem List Items Addressed This Visit       Cardiovascular and Mediastinum   HTN (hypertension), benign - Primary    Well controlled. Continue current  regimen. Follow up in  6 mo         Endocrine   IFG (impaired fasting glucose)    Last A1c was 5.6 at goal.  Continue to work on weight loss.        Other   Encounter for weight management    Visit #: 2 Starting Weight: 239 lbs    Current weight: 243 lbs  Previous weight: 239 lbs  Change in weight: up 4 lbs  Goal weight: 199 lbs  Dietary goals:work on cutting back on portions, drinking more water.  Exercise goals: swim class once a week.  Medication: Qsymia caused heartburn.  Will see if insurance will cover Contrave and if that does not effective or helpful then we could consider trying again for [facility]. Follow-up and referrals: 4 weeks after new start medication.       I think aquatic therapy will be fantastic for her continue with regular PT until she starts aquatic.  Follow-up as needed.  Return in about 5 weeks (around 04/13/2023) for New start medication.    Nani Gasser, MD

## 2023-03-09 NOTE — Assessment & Plan Note (Signed)
Visit #: 2 Starting Weight: 239 lbs    Current weight: 243 lbs  Previous weight: 239 lbs  Change in weight: up 4 lbs  Goal weight: 199 lbs  Dietary goals:work on cutting back on portions, drinking more water.  Exercise goals: swim class once a week.  Medication: Qsymia caused heartburn.  Will see if insurance will cover Contrave and if that does not effective or helpful then we could consider trying again for [facility]. Follow-up and referrals: 4 weeks after new start medication.

## 2023-03-11 ENCOUNTER — Ambulatory Visit: Payer: Medicare Other

## 2023-03-11 DIAGNOSIS — G8929 Other chronic pain: Secondary | ICD-10-CM

## 2023-03-11 DIAGNOSIS — M25561 Pain in right knee: Secondary | ICD-10-CM | POA: Diagnosis not present

## 2023-03-11 DIAGNOSIS — M25551 Pain in right hip: Secondary | ICD-10-CM

## 2023-03-11 DIAGNOSIS — M25661 Stiffness of right knee, not elsewhere classified: Secondary | ICD-10-CM

## 2023-03-11 DIAGNOSIS — M25662 Stiffness of left knee, not elsewhere classified: Secondary | ICD-10-CM

## 2023-03-11 NOTE — Therapy (Signed)
OUTPATIENT PHYSICAL THERAPY TREATMENT   Patient Name: [name] MRN: [id number] DOB:April 12, 1956, 67 y.o., female Today's Date: 03/11/2023  END OF SESSION:  PT End of Session - 03/11/23 0818     Visit Number 5    Date for PT Re-Evaluation 04/09/23    Authorization Type MCR/Tricare    Authorization Time Period 02/26/23 to 04/09/23    PT Start Time 0815   pt late   PT Stop Time 0845    PT Time Calculation (min) 30 min    Activity Tolerance Patient tolerated treatment well    Behavior During Therapy [facility] for tasks assessed/performed               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   Blood transfusion without reported diagnosis    1980's   Colon cancer (HCC) 2009   colon   GERD (gastroesophageal reflux disease)    Hiatal hernia    Hypertension    Past Surgical History:  Procedure Laterality Date   CESAREAN SECTION     1982, 1988, 1989   CHOLECYSTECTOMY  1980   COLONOSCOPY     COLONOSCOPY WITH PROPOFOL N/A 10/02/2015   Procedure: COLONOSCOPY WITH PROPOFOL;  Surgeon: Meryl Dare, MD;  Location: [facility];  Service: Endoscopy;  Laterality: N/A;   laparoscopic sleeve gastrectomy N/A    Surgeon- Greig Right Teppara   PARTIAL COLECTOMY  2009   POLYPECTOMY     TUBAL LIGATION  1989   Patient Active Problem List   Diagnosis Date Noted   Hyperlipidemia 02/11/2023   Encounter for weight management 12/18/2022   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   Sebaceous cyst 11/02/2019   OSA (obstructive sleep apnea) 03/22/2018   Trochanteric bursitis of right hip 05/14/2017   Morbid obesity (HCC) 08/01/2016   IFG (impaired fasting glucose) 07/14/2016   Primary osteoarthritis of both hands 08/03/2015   Elevated LDL cholesterol level 09/16/2013   Trigger thumb of left hand 09/16/2013   Knee osteoarthritis 09/16/2013   HTN (hypertension), benign 09/16/2013   IDA  (iron deficiency anemia) 09/16/2013   Asthma 09/16/2013   Unspecified vitamin D deficiency 09/16/2013   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    PCP: Agapito Games, MD  REFERRING PROVIDER: Agapito Games, MD  REFERRING DIAG:  M79.18 (ICD-10-CM) - Right buttock pain  M17.0 (ICD-10-CM) - Primary osteoarthritis of both knees    THERAPY DIAG:  Chronic pain of right knee  Stiffness of right knee, not elsewhere classified  Chronic pain of left knee  Stiffness of left knee, not elsewhere classified  Pain in right hip  Rationale for Evaluation and Treatment: Rehabilitation  ONSET DATE: 02/09/2023  SUBJECTIVE:   SUBJECTIVE STATEMENT: Pt reports she is late today from thinking too much this morning.   PERTINENT HISTORY:  PAIN:  Are you having pain? 4/10  bil knees  PRECAUTIONS: ICD/Pacemaker  WEIGHT BEARING RESTRICTIONS: No  FALLS:  Has patient fallen in last 6 months? No  LIVING ENVIRONMENT: Lives with: lives with their spouse Lives in: House/apartment Stairs: no STE, 7 steps up, 7 steps down split level home  Has following equipment at home: Single point cane  OCCUPATION: nurse case manager   PLOF: Independent, Independent with basic ADLs, Independent with gait, and Independent with transfers  PATIENT GOALS: know what I can do to  prevent or reduce pain flares   NEXT MD VISIT: Dr. Linford Arnold May 8th   OBJECTIVE:   DIAGNOSTIC FINDINGS:   PATIENT SURVEYS:  LEFS 13/80  COGNITION: Overall cognitive status: Within functional limits for tasks assessed     SENSATION: Not tested no complaints   MUSCLE LENGTH:  HS WNL B  Piriformis L WNL, R moderate limitation   LOWER EXTREMITY ROM:  Active ROM Right eval Left eval Right 03/11/23 Left 03/11/23  Hip flexion      Knee flexion 60* 75* 80 84  Knee extension 0* 0*     (Blank rows = not tested)  LOWER EXTREMITY MMT:  MMT Right eval Left eval  Hip flexion 3- 3-  Hip  extension    Hip abduction 3 4  Hip adduction    Hip internal rotation    Hip external rotation    Knee flexion 4 4  Knee extension 4+ 4+  Ankle dorsiflexion 4+ 4+   (Blank rows = not tested)  GAIT: Distance walked: in clinic distances  Assistive device utilized: Single point cane Level of assistance: Modified independence Comments: antalgic gait pattern with wide BOS, limited stance times and step lengths    TODAY'S TREATMENT:                                                                                                                              DATE: 03/11/23 Nustep L5x71min Step up and over 4' step x 10  Knee flexion 10# 2x10 BLE Knee extension 5# 2x10 BLE B hip up and over sitting x 10  Seated R heel slide with strap x 10   03/09/23 Nustep L5 x 6 min (640 steps) Seated marching x 10 ea (feet on 4 inch step) Seated hip ABD RTB 2x10, 1x5 Seated knee flexion RTB loop 2x10 Sit to stand x 5 Standing step ups R onto aerobic base x 10 (pt does not want to bend knee) 8 inch step toe taps x 10 each to encourage knee flexion 8 inch step lunge stretch x 10 ea side Supine HS stretch with strap x 30 sec bil Supine ITB stretch with strap x 30 sec bil    03/04/23 Nustep L5 x 6.5 min (500 steps) Trigger Point Dry-Needling  Skilled palpation and monitoring of soft tissues during DN STM to R gluteals, piriformis and lateral quads Treatment instructions: Expect mild to moderate muscle soreness. S/S of pneumothorax if dry needled over a lung field, and to seek immediate medical attention should they occur. Patient verbalized understanding of these instructions and education. Patient Consent Given: Yes Education handout provided: Yes Muscles treated: R gluteals, piriformis, lateral quads Electrical stimulation performed: No Parameters: N/A Treatment response/outcome: Twitch Response Elicited and Palpable Increase in Muscle Length   03/02/23 Attempted bike (unable to do) Seated  marching x 10 ea (resistance too difficult) Seated hip ABD RTB 2x10 Seated knee extension RTB 2x10 Supine HS stretch with strap x 30 sec  Supine ITB stretch with strap x 30 sec Gait: adjusted SPC to correct height and amb 80 feet with cane in L UE Manual: IASTM and STM, Deep MFR to right gluteals, piriformis; self   Eval   Objective measures/appropriate education   THerEx  Nustep L2 x6 minutes BLEs only     PATIENT EDUCATION:  Education details: DN education and aftercare  Person educated: Patient Education method: Explanation, Demonstration, and Handouts Education comprehension: verbalized understanding, returned demonstration, and needs further education  HOME EXERCISE PROGRAM: Access Code: [email or web address] URL: [email or web address] Date: 03/09/2023 Prepared by: Raynelle Fanning  Exercises - Sitting Knee Extension with Resistance  - 1 x daily - 7 x weekly - 3 sets - 10 reps - Seated March with Resistance  - 1 x daily - 7 x weekly - 3 sets - 10 reps - Seated Hip Abduction with Resistance  - 1 x daily - 7 x weekly - 3 sets - 10 reps - Sit to Stand  - 3 x daily - 7 x weekly - 1-2 sets - 10 reps - Standing Hip Abduction with Resistance at Ankles and Counter Support  - 1 x daily - 7 x weekly - 3 sets - 10 reps - Standing Hip Extension with Resistance at Ankles and Counter Support  - 1 x daily - 7 x weekly - 3 sets - 10 reps - Standing Knee Flexion Stretch on Step  - 1 x daily - 7 x weekly - 2 sets - 10 reps  ASSESSMENT:  CLINICAL IMPRESSION: Pt arrived late to session (15 min). Continued with progression of exercises to tolerance. She was able to do weight machines with no issues today. Improved knee AROM in both knees today.   OBJECTIVE IMPAIRMENTS: Abnormal gait, difficulty walking, decreased ROM, decreased strength, increased fascial restrictions, impaired flexibility, obesity, and pain.   ACTIVITY LIMITATIONS: standing, squatting, stairs, transfers, and locomotion  level  PARTICIPATION LIMITATIONS: driving, shopping, community activity, occupation, and yard work  PERSONAL FACTORS: Age, Behavior pattern, Education, Fitness, Past/current experiences, Social background, and Time since onset of injury/illness/exacerbation are also affecting patient's functional outcome.   REHAB POTENTIAL: Fair chronicity of pain, obesity   CLINICAL DECISION MAKING: Stable/uncomplicated  EVALUATION COMPLEXITY: Low   GOALS: Goals reviewed with patient? Yes  SHORT TERM GOALS: Target date: 03/19/2023   Will be compliant with appropriate progressive HEP  Baseline: Goal status: INITIAL  2.  Knee flexion ROM to improve by 15 degrees B  Baseline:  Goal status: INITIAL  3.  R knee extension ROM to be no more than 5 degrees  Baseline:  Goal status: INITIAL  4.  Piriformis mm spasm and flexibility impairment to be resolved  Baseline:  Goal status: INITIAL   LONG TERM GOALS: Target date: 04/09/2023    MMT to improve by 1 grade in all weak groups  Baseline:  Goal status: INITIAL  2.  Pain to be no more than 2/10 at worst in B knees and R piriformis/glute  Baseline:  Goal status: INITIAL  3.  Will tolerate advanced HEP vs gym level programming to maintain functional status and prevent recurrence of severe pain  Baseline:  Goal status: INITIAL  4.  LEFS score to improve by at least 15 points to show improvement in functional status  Baseline:  Goal status: INITIAL    PLAN:  PT FREQUENCY: 2x/week  PT DURATION: 6 weeks  PLANNED INTERVENTIONS: Therapeutic exercises, Therapeutic activity, Neuromuscular re-education, Balance training, Gait training, Patient/Family education, Self Care, Joint mobilization, Stair training,  DME instructions, Aquatic Therapy, Dry Needling, Electrical stimulation, Spinal mobilization, Cryotherapy, Moist heat, Taping, Ultrasound, Ionotophoresis 4mg /ml Dexamethasone, Manual therapy, and Re-evaluation  PLAN FOR NEXT SESSION: split  between land and water therapies; knee ROM, general strength and gait training, DN to hip  Darleene Cleaver, PTA 03/11/2023 8:49 AM

## 2023-03-16 ENCOUNTER — Ambulatory Visit: Payer: Medicare Other | Admitting: Physical Therapy

## 2023-03-16 ENCOUNTER — Encounter: Payer: Self-pay | Admitting: Physical Therapy

## 2023-03-16 DIAGNOSIS — G8929 Other chronic pain: Secondary | ICD-10-CM

## 2023-03-16 DIAGNOSIS — M25551 Pain in right hip: Secondary | ICD-10-CM

## 2023-03-16 DIAGNOSIS — M25661 Stiffness of right knee, not elsewhere classified: Secondary | ICD-10-CM

## 2023-03-16 DIAGNOSIS — R29810 Facial weakness: Secondary | ICD-10-CM

## 2023-03-16 DIAGNOSIS — M25662 Stiffness of left knee, not elsewhere classified: Secondary | ICD-10-CM

## 2023-03-16 DIAGNOSIS — M25561 Pain in right knee: Secondary | ICD-10-CM | POA: Diagnosis not present

## 2023-03-16 NOTE — Therapy (Signed)
OUTPATIENT PHYSICAL THERAPY TREATMENT   Patient Name: [name] MRN: [id number] DOB:1956-03-28, 67 y.o., female Today's Date: 03/16/2023  END OF SESSION:  PT End of Session - 03/16/23 0806     Visit Number 6    Number of Visits 13    Date for PT Re-Evaluation 04/09/23    Authorization Type MCR/T[employer]    Authorization Time Period 02/26/23 to 04/09/23    PT Start Time 0805    PT Stop Time 0848    PT Time Calculation (min) 43 min    Activity Tolerance Patient tolerated treatment well    Behavior During Therapy [facility] for tasks assessed/performed                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   Blood transfusion without reported diagnosis    1980's   Colon cancer (HCC) 2009   colon   GERD (gastroesophageal reflux disease)    Hiatal hernia    Hypertension    Past Surgical History:  Procedure Laterality Date   CESAREAN SECTION     1982, 1988, 1989   CHOLECYSTECTOMY  1980   COLONOSCOPY     COLONOSCOPY WITH PROPOFOL N/A 10/02/2015   Procedure: COLONOSCOPY WITH PROPOFOL;  Surgeon: Meryl Dare, MD;  Location: [facility];  Service: Endoscopy;  Laterality: N/A;   laparoscopic sleeve gastrectomy N/A    Surgeon- Greig Right Teppara   PARTIAL COLECTOMY  2009   POLYPECTOMY     TUBAL LIGATION  1989   Patient Active Problem List   Diagnosis Date Noted   Hyperlipidemia 02/11/2023   Encounter for weight management 12/18/2022   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   Sebaceous cyst 11/02/2019   OSA (obstructive sleep apnea) 03/22/2018   Trochanteric bursitis of right hip 05/14/2017   Morbid obesity (HCC) 08/01/2016   IFG (impaired fasting glucose) 07/14/2016   Primary osteoarthritis of both hands 08/03/2015   Elevated LDL cholesterol level 09/16/2013   Trigger thumb of left hand 09/16/2013   Knee osteoarthritis 09/16/2013   HTN (hypertension), benign  09/16/2013   IDA (iron deficiency anemia) 09/16/2013   Asthma 09/16/2013   Unspecified vitamin D deficiency 09/16/2013   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    PCP: Agapito Games, MD  REFERRING PROVIDER: Agapito Games, MD  REFERRING DIAG:  M79.18 (ICD-10-CM) - Right buttock pain  M17.0 (ICD-10-CM) - Primary osteoarthritis of both knees    THERAPY DIAG:  Chronic pain of right knee  Stiffness of right knee, not elsewhere classified  Chronic pain of left knee  Stiffness of left knee, not elsewhere classified  Pain in right hip  Facial weakness  Rationale for Evaluation and Treatment: Rehabilitation  ONSET DATE: 02/09/2023  SUBJECTIVE:   SUBJECTIVE STATEMENT: Patient states she was able to walk to the grave site at her Aunt's funeral yesterday.  PERTINENT HISTORY:  PAIN:  Are you having pain? 3-4/10  bil knees  PRECAUTIONS: ICD/Pacemaker  WEIGHT BEARING RESTRICTIONS: No  FALLS:  Has patient fallen in last 6 months? No  LIVING ENVIRONMENT: Lives with: lives with their spouse Lives in: House/apartment Stairs: no STE, 7 steps up, 7 steps down split level home  Has following equipment at home: Single point cane  OCCUPATION: nurse case manager   PLOF: Independent, Independent with basic ADLs, Independent with gait, and Independent  with transfers  PATIENT GOALS: know what I can do to prevent or reduce pain flares   NEXT MD VISIT: Dr. Linford Arnold May 8th   OBJECTIVE:   DIAGNOSTIC FINDINGS:   PATIENT SURVEYS:  LEFS 13/80  COGNITION: Overall cognitive status: Within functional limits for tasks assessed     SENSATION: Not tested no complaints   MUSCLE LENGTH:  HS WNL B  Piriformis L WNL, R moderate limitation   LOWER EXTREMITY ROM:  Active ROM Right eval Left eval Right 03/11/23 Left 03/11/23 Right 03/16/23 Left 03/16/23  Hip flexion        Knee flexion 60* 75* 80 84 79 75  Knee extension 0* 0*       (Blank rows =  not tested)  LOWER EXTREMITY MMT:  MMT Right eval Left eval  Hip flexion 3- 3-  Hip extension    Hip abduction 3 4  Hip adduction    Hip internal rotation    Hip external rotation    Knee flexion 4 4  Knee extension 4+ 4+  Ankle dorsiflexion 4+ 4+   (Blank rows = not tested)  GAIT: Distance walked: in clinic distances  Assistive device utilized: Single point cane Level of assistance: Modified independence Comments: antalgic gait pattern with wide BOS, limited stance times and step lengths    TODAY'S TREATMENT:                                                                                                                              DATE: 03/16/23 Nustep L5x16min Step up and over 4' step x 10  Knee flexion 10# 2x10 BLE Knee extension 5# 2x10 BLE B hip up and over sitting x 10  Seated B heel slide with strap x 10  Standing hip ABD and EXT 2x5 ea B Red TB at ankles (change to thighs) Standing hip ABD and EXT 2x5 ea B Red TB at thighs 1x 5  03/11/23 Nustep L5x59min Step up and over 4' step x 10  Knee flexion 10# 2x10 BLE Knee extension 5# 2x10 BLE B hip up and over sitting x 10  Seated R heel slide with strap x 10   03/09/23 Nustep L5 x 6 min (640 steps) Seated marching x 10 ea (feet on 4 inch step) Seated hip ABD RTB 2x10, 1x5 Seated knee flexion RTB loop 2x10 Sit to stand x 5 Standing step ups R onto aerobic base x 10 (pt does not want to bend knee) 8 inch step toe taps x 10 each to encourage knee flexion 8 inch step lunge stretch x 10 ea side Supine HS stretch with strap x 30 sec bil Supine ITB stretch with strap x 30 sec bil  03/04/23 Nustep L5 x 6.5 min (500 steps) Trigger Point Dry-Needling  Skilled palpation and monitoring of soft tissues during DN STM to R gluteals, piriformis and lateral quads Treatment instructions: Expect mild to moderate muscle soreness. S/S of pneumothorax if dry  needled over a lung field, and to seek immediate medical attention should  they occur. Patient verbalized understanding of these instructions and education. Patient Consent Given: Yes Education handout provided: Yes Muscles treated: R gluteals, piriformis, lateral quads Electrical stimulation performed: No Parameters: N/A Treatment response/outcome: Twitch Response Elicited and Palpable Increase in Muscle Length  PATIENT EDUCATION:  Education details: DN education and aftercare  Person educated: Patient Education method: Explanation, Demonstration, and Handouts Education comprehension: verbalized understanding, returned demonstration, and needs further education  HOME EXERCISE PROGRAM: Access Code: [email or web address] URL: [email or web address] Date: 03/09/2023 Prepared by: Raynelle Fanning  Exercises - Sitting Knee Extension with Resistance  - 1 x daily - 7 x weekly - 3 sets - 10 reps - Seated March with Resistance  - 1 x daily - 7 x weekly - 3 sets - 10 reps - Seated Hip Abduction with Resistance  - 1 x daily - 7 x weekly - 3 sets - 10 reps - Sit to Stand  - 3 x daily - 7 x weekly - 1-2 sets - 10 reps - Standing Hip Abduction with Resistance at Ankles and Counter Support  - 1 x daily - 7 x weekly - 3 sets - 10 reps - Standing Hip Extension with Resistance at Ankles and Counter Support  - 1 x daily - 7 x weekly - 3 sets - 10 reps - Standing Knee Flexion Stretch on Step  - 1 x daily - 7 x weekly - 2 sets - 10 reps  ASSESSMENT:  CLINICAL IMPRESSION: Trenna demonstrates decreased knee flexion compared to last assessment, but has still met her flexion goal on the R knee. She continues to demonstrate lack of flexion with with step ups and step downs compensating with hip extension. She is fearful of pain and the knee giving out and relies heavily on her [facility] when asked to flex knee more.She is not fully compliant with HEP. Jerrica continues to demonstrate potential for improvement and would benefit from continued skilled therapy to address impairments.    OBJECTIVE  IMPAIRMENTS: Abnormal gait, difficulty walking, decreased ROM, decreased strength, increased fascial restrictions, impaired flexibility, obesity, and pain.   ACTIVITY LIMITATIONS: standing, squatting, stairs, transfers, and locomotion level  PARTICIPATION LIMITATIONS: driving, shopping, community activity, occupation, and yard work  PERSONAL FACTORS: Age, Behavior pattern, Education, Fitness, Past/current experiences, Social background, and Time since onset of injury/illness/exacerbation are also affecting patient's functional outcome.   REHAB POTENTIAL: Fair chronicity of pain, obesity   CLINICAL DECISION MAKING: Stable/uncomplicated  EVALUATION COMPLEXITY: Low   GOALS: Goals reviewed with patient? Yes  SHORT TERM GOALS: Target date: 03/19/2023   Will be compliant with appropriate progressive HEP  Baseline: Goal status: IN PROGRESS only partially compliant  2.  Knee flexion ROM to improve by 15 degrees B  Baseline:  Goal status: PARTIALLY MET R knee met.  3.  R knee extension ROM to be no more than 5 degrees  Baseline:  Goal status: MET  4.  Piriformis mm spasm and flexibility impairment to be resolved  Baseline:  Goal status: MET   LONG TERM GOALS: Target date: 04/09/2023    MMT to improve by 1 grade in all weak groups  Baseline:  Goal status: INITIAL  2.  Pain to be no more than 2/10 at worst in B knees and R piriformis/glute  Baseline:  Goal status: INITIAL  3.  Will tolerate advanced HEP vs gym level programming to maintain functional status and prevent recurrence of severe pain  Baseline:  Goal  status: INITIAL  4.  LEFS score to improve by at least 15 points to show improvement in functional status  Baseline:  Goal status: INITIAL    PLAN:  PT FREQUENCY: 2x/week  PT DURATION: 6 weeks  PLANNED INTERVENTIONS: Therapeutic exercises, Therapeutic activity, Neuromuscular re-education, Balance training, Gait training, Patient/Family education, Self Care,  Joint mobilization, Stair training, DME instructions, Aquatic Therapy, Dry Needling, Electrical stimulation, Spinal mobilization, Cryotherapy, Moist heat, Taping, Ultrasound, Ionotophoresis 4mg /ml Dexamethasone, Manual therapy, and Re-evaluation  PLAN FOR NEXT SESSION: split between land and water therapies; knee ROM, general strength and gait training, DN to hip  Yago Ludvigsen, PT 03/16/2023 12:16 PM

## 2023-03-19 ENCOUNTER — Ambulatory Visit: Payer: Medicare Other

## 2023-03-19 DIAGNOSIS — M25661 Stiffness of right knee, not elsewhere classified: Secondary | ICD-10-CM

## 2023-03-19 DIAGNOSIS — M25551 Pain in right hip: Secondary | ICD-10-CM

## 2023-03-19 DIAGNOSIS — M25561 Pain in right knee: Secondary | ICD-10-CM | POA: Diagnosis not present

## 2023-03-19 DIAGNOSIS — M25662 Stiffness of left knee, not elsewhere classified: Secondary | ICD-10-CM

## 2023-03-19 DIAGNOSIS — G8929 Other chronic pain: Secondary | ICD-10-CM

## 2023-03-19 NOTE — Therapy (Signed)
OUTPATIENT PHYSICAL THERAPY TREATMENT   Patient Name: [name] MRN: [id number] DOB:1956-07-02, 67 y.o., female Today's Date: 03/19/2023  END OF SESSION:  PT End of Session - 03/19/23 0833     Visit Number 7    Number of Visits 13    Date for PT Re-Evaluation 04/09/23    Authorization Type MCR/Tricare    Authorization Time Period 02/26/23 to 04/09/23    PT Start Time 0804    PT Stop Time 0846    PT Time Calculation (min) 42 min    Activity Tolerance Patient tolerated treatment well    Behavior During Therapy [facility] for tasks assessed/performed                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   Blood transfusion without reported diagnosis    1980's   Colon cancer (HCC) 2009   colon   GERD (gastroesophageal reflux disease)    Hiatal hernia    Hypertension    Past Surgical History:  Procedure Laterality Date   CESAREAN SECTION     1982, 1988, 1989   CHOLECYSTECTOMY  1980   COLONOSCOPY     COLONOSCOPY WITH PROPOFOL N/A 10/02/2015   Procedure: COLONOSCOPY WITH PROPOFOL;  Surgeon: Meryl Dare, MD;  Location: [facility];  Service: Endoscopy;  Laterality: N/A;   laparoscopic sleeve gastrectomy N/A    Surgeon- Greig Right Teppara   PARTIAL COLECTOMY  2009   POLYPECTOMY     TUBAL LIGATION  1989   Patient Active Problem List   Diagnosis Date Noted   Hyperlipidemia 02/11/2023   Encounter for weight management 12/18/2022   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   Sebaceous cyst 11/02/2019   OSA (obstructive sleep apnea) 03/22/2018   Trochanteric bursitis of right hip 05/14/2017   Morbid obesity (HCC) 08/01/2016   IFG (impaired fasting glucose) 07/14/2016   Primary osteoarthritis of both hands 08/03/2015   Elevated LDL cholesterol level 09/16/2013   Trigger thumb of left hand 09/16/2013   Knee osteoarthritis 09/16/2013   HTN (hypertension), benign  09/16/2013   IDA (iron deficiency anemia) 09/16/2013   Asthma 09/16/2013   Unspecified vitamin D deficiency 09/16/2013   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    PCP: Agapito Games, MD  REFERRING PROVIDER: Agapito Games, MD  REFERRING DIAG:  M79.18 (ICD-10-CM) - Right buttock pain  M17.0 (ICD-10-CM) - Primary osteoarthritis of both knees    THERAPY DIAG:  Chronic pain of right knee  Stiffness of right knee, not elsewhere classified  Chronic pain of left knee  Stiffness of left knee, not elsewhere classified  Pain in right hip  Rationale for Evaluation and Treatment: Rehabilitation  ONSET DATE: 02/09/2023  SUBJECTIVE:   SUBJECTIVE STATEMENT: Patient reports she is going out of town this strength  PERTINENT HISTORY:  PAIN:  Are you having pain? 4-5/10  bil knees  PRECAUTIONS: ICD/Pacemaker  WEIGHT BEARING RESTRICTIONS: No  FALLS:  Has patient fallen in last 6 months? No  LIVING ENVIRONMENT: Lives with: lives with their spouse Lives in: House/apartment Stairs: no STE, 7 steps up, 7 steps down split level home  Has following equipment at home: Single point cane  OCCUPATION: nurse case manager   PLOF: Independent, Independent with basic ADLs, Independent with gait, and Independent with transfers  PATIENT GOALS: know what I can  do to prevent or reduce pain flares   NEXT MD VISIT: Dr. Linford Arnold May 8th   OBJECTIVE:   DIAGNOSTIC FINDINGS:   PATIENT SURVEYS:  LEFS 13/80  COGNITION: Overall cognitive status: Within functional limits for tasks assessed     SENSATION: Not tested no complaints   MUSCLE LENGTH:  HS WNL B  Piriformis L WNL, R moderate limitation   LOWER EXTREMITY ROM:  Active ROM Right eval Left eval Right 03/11/23 Left 03/11/23 Right 03/16/23 Left 03/16/23  Hip flexion        Knee flexion 60* 75* 80 84 79 75  Knee extension 0* 0*       (Blank rows = not tested)  LOWER EXTREMITY MMT:  MMT  Right eval Left eval  Hip flexion 3- 3-  Hip extension    Hip abduction 3 4  Hip adduction    Hip internal rotation    Hip external rotation    Knee flexion 4 4  Knee extension 4+ 4+  Ankle dorsiflexion 4+ 4+   (Blank rows = not tested)  GAIT: Distance walked: in clinic distances  Assistive device utilized: Single point cane Level of assistance: Modified independence Comments: antalgic gait pattern with wide BOS, limited stance times and step lengths    TODAY'S TREATMENT:                                                                                                                              DATE: 03/18/23 Nustep L5x103min Seated heel slides with foot on peanut ball x 10 B 4' step ups B x 10 4' lateral step ups L Attempted wall mini squats - pt noting pain  Knee flexion 15# BLE x 10; 5# x 10 R/L Knee extension 2x10 5# Standing hip abduction x 10 2#  Standing hip extension x 10 2#  Seated PF with blue TB x 10 B  03/16/23 Nustep L5x37min Step up and over 4' step x 10  Knee flexion 10# 2x10 BLE Knee extension 5# 2x10 BLE B hip up and over sitting x 10  Seated B heel slide with strap x 10  Standing hip ABD and EXT 2x5 ea B Red TB at ankles (change to thighs) Standing hip ABD and EXT 2x5 ea B Red TB at thighs 1x 5  03/11/23 Nustep L5x50min Step up and over 4' step x 10  Knee flexion 10# 2x10 BLE Knee extension 5# 2x10 BLE B hip up and over sitting x 10  Seated R heel slide with strap x 10   03/09/23 Nustep L5 x 6 min (640 steps) Seated marching x 10 ea (feet on 4 inch step) Seated hip ABD RTB 2x10, 1x5 Seated knee flexion RTB loop 2x10 Sit to stand x 5 Standing step ups R onto aerobic base x 10 (pt does not want to bend knee) 8 inch step toe taps x 10 each to encourage knee flexion 8 inch step lunge stretch x 10 ea  side Supine HS stretch with strap x 30 sec bil Supine ITB stretch with strap x 30 sec bil  03/04/23 Nustep L5 x 6.5 min (500 steps) Trigger Point  Dry-Needling  Skilled palpation and monitoring of soft tissues during DN STM to R gluteals, piriformis and lateral quads Treatment instructions: Expect mild to moderate muscle soreness. S/S of pneumothorax if dry needled over a lung field, and to seek immediate medical attention should they occur. Patient verbalized understanding of these instructions and education. Patient Consent Given: Yes Education handout provided: Yes Muscles treated: R gluteals, piriformis, lateral quads Electrical stimulation performed: No Parameters: N/A Treatment response/outcome: Twitch Response Elicited and Palpable Increase in Muscle Length  PATIENT EDUCATION:  Education details: DN education and aftercare  Person educated: Patient Education method: Explanation, Demonstration, and Handouts Education comprehension: verbalized understanding, returned demonstration, and needs further education  HOME EXERCISE PROGRAM: Access Code: [email or web address] URL: [email or web address] Date: 03/09/2023 Prepared by: Raynelle Fanning  Exercises - Sitting Knee Extension with Resistance  - 1 x daily - 7 x weekly - 3 sets - 10 reps - Seated March with Resistance  - 1 x daily - 7 x weekly - 3 sets - 10 reps - Seated Hip Abduction with Resistance  - 1 x daily - 7 x weekly - 3 sets - 10 reps - Sit to Stand  - 3 x daily - 7 x weekly - 1-2 sets - 10 reps - Standing Hip Abduction with Resistance at Ankles and Counter Support  - 1 x daily - 7 x weekly - 3 sets - 10 reps - Standing Hip Extension with Resistance at Ankles and Counter Support  - 1 x daily - 7 x weekly - 3 sets - 10 reps - Standing Knee Flexion Stretch on Step  - 1 x daily - 7 x weekly - 2 sets - 10 reps  ASSESSMENT:  CLINICAL IMPRESSION: Progressed exercises to work on LE strength to improve function. She avoids bending her knees with step ups, we attempted to do mini wall squats but she had pain with flexion. She notes that she is already scheduled for aquatic therapy,  which seems to be good for her to offload her knees but still work on knee strength and mobility. Kezaria continues to demonstrate potential for improvement and would benefit from continued skilled therapy to address impairments.    OBJECTIVE IMPAIRMENTS: Abnormal gait, difficulty walking, decreased ROM, decreased strength, increased fascial restrictions, impaired flexibility, obesity, and pain.   ACTIVITY LIMITATIONS: standing, squatting, stairs, transfers, and locomotion level  PARTICIPATION LIMITATIONS: driving, shopping, community activity, occupation, and yard work  PERSONAL FACTORS: Age, Behavior pattern, Education, Fitness, Past/current experiences, Social background, and Time since onset of injury/illness/exacerbation are also affecting patient's functional outcome.   REHAB POTENTIAL: Fair chronicity of pain, obesity   CLINICAL DECISION MAKING: Stable/uncomplicated  EVALUATION COMPLEXITY: Low   GOALS: Goals reviewed with patient? Yes  SHORT TERM GOALS: Target date: 03/19/2023   Will be compliant with appropriate progressive HEP  Baseline: Goal status: IN PROGRESS only partially compliant  2.  Knee flexion ROM to improve by 15 degrees B  Baseline:  Goal status: PARTIALLY MET R knee met.  3.  R knee extension ROM to be no more than 5 degrees  Baseline:  Goal status: MET  4.  Piriformis mm spasm and flexibility impairment to be resolved  Baseline:  Goal status: MET   LONG TERM GOALS: Target date: 04/09/2023    MMT to improve by 1 grade in all weak  groups  Baseline:  Goal status: IN PROGRESS  2.  Pain to be no more than 2/10 at worst in B knees and R piriformis/glute  Baseline:  Goal status: IN PROGRESS  3.  Will tolerate advanced HEP vs gym level programming to maintain functional status and prevent recurrence of severe pain  Baseline:  Goal status: IN PROGRESS  4.  LEFS score to improve by at least 15 points to show improvement in functional status  Baseline:   Goal status: IN PROGRESS    PLAN:  PT FREQUENCY: 2x/week  PT DURATION: 6 weeks  PLANNED INTERVENTIONS: Therapeutic exercises, Therapeutic activity, Neuromuscular re-education, Balance training, Gait training, Patient/Family education, Self Care, Joint mobilization, Stair training, DME instructions, Aquatic Therapy, Dry Needling, Electrical stimulation, Spinal mobilization, Cryotherapy, Moist heat, Taping, Ultrasound, Ionotophoresis 4mg /ml Dexamethasone, Manual therapy, and Re-evaluation  PLAN FOR NEXT SESSION: split between land and water therapies; knee ROM, general strength and gait training, DN to hip  Darleene Cleaver, PTA 03/19/2023 9:01 AM

## 2023-03-24 ENCOUNTER — Ambulatory Visit: Payer: Medicare Other

## 2023-03-24 DIAGNOSIS — M25561 Pain in right knee: Secondary | ICD-10-CM | POA: Diagnosis not present

## 2023-03-24 DIAGNOSIS — G8929 Other chronic pain: Secondary | ICD-10-CM

## 2023-03-24 DIAGNOSIS — M25662 Stiffness of left knee, not elsewhere classified: Secondary | ICD-10-CM

## 2023-03-24 DIAGNOSIS — M25551 Pain in right hip: Secondary | ICD-10-CM

## 2023-03-24 DIAGNOSIS — M25661 Stiffness of right knee, not elsewhere classified: Secondary | ICD-10-CM

## 2023-03-24 NOTE — Therapy (Signed)
OUTPATIENT PHYSICAL THERAPY TREATMENT   Patient Name: [name] MRN: [id number] DOB:[birth year], 67 y.o., female Today's Date: 03/24/2023  END OF SESSION:  PT End of Session - 03/24/23 0812     Visit Number 8    Number of Visits 13    Date for PT Re-Evaluation 04/09/23    Authorization Type MCR/T[employer]    Authorization Time Period 02/26/23 to 04/09/23    PT Start Time 0807   pt late   PT Stop Time 0846    PT Time Calculation (min) 39 min    Activity Tolerance Patient tolerated treatment well    Behavior During Therapy [facility] for tasks assessed/performed                 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   Blood transfusion without reported diagnosis    1980's   Colon cancer (HCC) 2009   colon   GERD (gastroesophageal reflux disease)    Hiatal hernia    Hypertension    Past Surgical History:  Procedure Laterality Date   CESAREAN SECTION     1982, 1988, 1989   CHOLECYSTECTOMY  1980   COLONOSCOPY     COLONOSCOPY WITH PROPOFOL N/A 10/02/2015   Procedure: COLONOSCOPY WITH PROPOFOL;  Surgeon: Meryl Dare, MD;  Location: [facility];  Service: Endoscopy;  Laterality: N/A;   laparoscopic sleeve gastrectomy N/A    Surgeon- Greig Right Teppara   PARTIAL COLECTOMY  2009   POLYPECTOMY     TUBAL LIGATION  1989   Patient Active Problem List   Diagnosis Date Noted   Hyperlipidemia 02/11/2023   Encounter for weight management 12/18/2022   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   Sebaceous cyst 11/02/2019   OSA (obstructive sleep apnea) 03/22/2018   Trochanteric bursitis of right hip 05/14/2017   Morbid obesity (HCC) 08/01/2016   IFG (impaired fasting glucose) 07/14/2016   Primary osteoarthritis of both hands 08/03/2015   Elevated LDL cholesterol level 09/16/2013   Trigger thumb of left hand 09/16/2013   Knee osteoarthritis 09/16/2013   HTN (hypertension),  benign 09/16/2013   IDA (iron deficiency anemia) 09/16/2013   Asthma 09/16/2013   Unspecified vitamin D deficiency 09/16/2013   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    PCP: Agapito Games, MD  REFERRING PROVIDER: Agapito Games, MD  REFERRING DIAG:  M79.18 (ICD-10-CM) - Right buttock pain  M17.0 (ICD-10-CM) - Primary osteoarthritis of both knees    THERAPY DIAG:  Chronic pain of right knee  Stiffness of right knee, not elsewhere classified  Chronic pain of left knee  Stiffness of left knee, not elsewhere classified  Pain in right hip  Rationale for Evaluation and Treatment: Rehabilitation  ONSET DATE: 02/09/2023  SUBJECTIVE:   SUBJECTIVE STATEMENT: Pt reports not doing too much walking on her vacation this past weekend. Pain is about a 4/10 today.   PERTINENT HISTORY:  PAIN:  Are you having pain? 4/10  bil knees  PRECAUTIONS: ICD/Pacemaker  WEIGHT BEARING RESTRICTIONS: No  FALLS:  Has patient fallen in last 6 months? No  LIVING ENVIRONMENT: Lives with: lives with their spouse Lives in: House/apartment Stairs: no STE, 7 steps up, 7 steps down split level home  Has following equipment at home: Single point cane  OCCUPATION: nurse case manager   PLOF: Independent, Independent with basic ADLs,  Independent with gait, and Independent with transfers  PATIENT GOALS: know what I can do to prevent or reduce pain flares   NEXT MD VISIT: Dr. Linford Arnold May 8th   OBJECTIVE:   DIAGNOSTIC FINDINGS:   PATIENT SURVEYS:  LEFS 13/80  COGNITION: Overall cognitive status: Within functional limits for tasks assessed     SENSATION: Not tested no complaints   MUSCLE LENGTH:  HS WNL B  Piriformis L WNL, R moderate limitation   LOWER EXTREMITY ROM:  Active ROM Right eval Left eval Right 03/11/23 Left 03/11/23 Right 03/16/23 Left 03/16/23  Hip flexion        Knee flexion 60* 75* 80 84 79 75  Knee extension 0* 0*       (Blank rows =  not tested)  LOWER EXTREMITY MMT:  MMT Right eval Left eval  Hip flexion 3- 3-  Hip extension    Hip abduction 3 4  Hip adduction    Hip internal rotation    Hip external rotation    Knee flexion 4 4  Knee extension 4+ 4+  Ankle dorsiflexion 4+ 4+   (Blank rows = not tested)  GAIT: Distance walked: in clinic distances  Assistive device utilized: Single point cane Level of assistance: Modified independence Comments: antalgic gait pattern with wide BOS, limited stance times and step lengths   TODAY'S TREATMENT:                                                                                                                              DATE: 03/24/23 Nustep L5x56min Leg press 20# 2x10 BLE Knee flexion 20# 2x10 BLE Knee extension 10# 2x10 BLE Single leg RDL x 10  R/L Side steps red TB ankles 2x66ft Seated LAQ red TB x 15 B  03/18/23 Nustep L5x30min Seated heel slides with foot on peanut ball x 10 B 4' step ups B x 10 4' lateral step ups L Attempted wall mini squats - pt noting pain  Knee flexion 15# BLE x 10; 5# x 10 R/L Knee extension 2x10 5# Standing hip abduction x 10 2#  Standing hip extension x 10 2#  Seated PF with blue TB x 10 B  03/16/23 Nustep L5x62min Step up and over 4' step x 10  Knee flexion 10# 2x10 BLE Knee extension 5# 2x10 BLE B hip up and over sitting x 10  Seated B heel slide with strap x 10  Standing hip ABD and EXT 2x5 ea B Red TB at ankles (change to thighs) Standing hip ABD and EXT 2x5 ea B Red TB at thighs 1x 5  03/11/23 Nustep L5x35min Step up and over 4' step x 10  Knee flexion 10# 2x10 BLE Knee extension 5# 2x10 BLE B hip up and over sitting x 10  Seated R heel slide with strap x 10   03/09/23 Nustep L5 x 6 min (640 steps) Seated marching x 10 ea (feet on 4 inch step) Seated  hip ABD RTB 2x10, 1x5 Seated knee flexion RTB loop 2x10 Sit to stand x 5 Standing step ups R onto aerobic base x 10 (pt does not want to bend knee) 8 inch step  toe taps x 10 each to encourage knee flexion 8 inch step lunge stretch x 10 ea side Supine HS stretch with strap x 30 sec bil Supine ITB stretch with strap x 30 sec bil  03/04/23 Nustep L5 x 6.5 min (500 steps) Trigger Point Dry-Needling  Skilled palpation and monitoring of soft tissues during DN STM to R gluteals, piriformis and lateral quads Treatment instructions: Expect mild to moderate muscle soreness. S/S of pneumothorax if dry needled over a lung field, and to seek immediate medical attention should they occur. Patient verbalized understanding of these instructions and education. Patient Consent Given: Yes Education handout provided: Yes Muscles treated: R gluteals, piriformis, lateral quads Electrical stimulation performed: No Parameters: N/A Treatment response/outcome: Twitch Response Elicited and Palpable Increase in Muscle Length  PATIENT EDUCATION:  Education details: DN education and aftercare  Person educated: Patient Education method: Explanation, Demonstration, and Handouts Education comprehension: verbalized understanding, returned demonstration, and needs further education  HOME EXERCISE PROGRAM: Access Code: [email or web address] URL: [email or web address] Date: 03/09/2023 Prepared by: Raynelle Fanning  Exercises - Sitting Knee Extension with Resistance  - 1 x daily - 7 x weekly - 3 sets - 10 reps - Seated March with Resistance  - 1 x daily - 7 x weekly - 3 sets - 10 reps - Seated Hip Abduction with Resistance  - 1 x daily - 7 x weekly - 3 sets - 10 reps - Sit to Stand  - 3 x daily - 7 x weekly - 1-2 sets - 10 reps - Standing Hip Abduction with Resistance at Ankles and Counter Support  - 1 x daily - 7 x weekly - 3 sets - 10 reps - Standing Hip Extension with Resistance at Ankles and Counter Support  - 1 x daily - 7 x weekly - 3 sets - 10 reps - Standing Knee Flexion Stretch on Step  - 1 x daily - 7 x weekly - 2 sets - 10 reps  ASSESSMENT:  CLINICAL IMPRESSION: Pt  recently went on vacation, did not report much issues from then with regard to her knees. Advanced through knee and hip strengthening exercises trying to avoid pain, which is mostly caused by knee flexion in WB. Cues provided throughout session to target the correct muscles for max benefit with exercises.  Kateri continues to demonstrate potential for improvement and would benefit from continued skilled therapy to address impairments.    OBJECTIVE IMPAIRMENTS: Abnormal gait, difficulty walking, decreased ROM, decreased strength, increased fascial restrictions, impaired flexibility, obesity, and pain.   ACTIVITY LIMITATIONS: standing, squatting, stairs, transfers, and locomotion level  PARTICIPATION LIMITATIONS: driving, shopping, community activity, occupation, and yard work  PERSONAL FACTORS: Age, Behavior pattern, Education, Fitness, Past/current experiences, Social background, and Time since onset of injury/illness/exacerbation are also affecting patient's functional outcome.   REHAB POTENTIAL: Fair chronicity of pain, obesity   CLINICAL DECISION MAKING: Stable/uncomplicated  EVALUATION COMPLEXITY: Low   GOALS: Goals reviewed with patient? Yes  SHORT TERM GOALS: Target date: 03/19/2023   Will be compliant with appropriate progressive HEP  Baseline: Goal status: IN PROGRESS only partially compliant  2.  Knee flexion ROM to improve by 15 degrees B  Baseline:  Goal status: PARTIALLY MET R knee met.  3.  R knee extension ROM to be no more than 5 degrees  Baseline:  Goal status: MET  4.  Piriformis mm spasm and flexibility impairment to be resolved  Baseline:  Goal status: MET   LONG TERM GOALS: Target date: 04/09/2023    MMT to improve by 1 grade in all weak groups  Baseline:  Goal status: IN PROGRESS  2.  Pain to be no more than 2/10 at worst in B knees and R piriformis/glute  Baseline:  Goal status: IN PROGRESS   3.  Will tolerate advanced HEP vs gym level programming  to maintain functional status and prevent recurrence of severe pain  Baseline:  Goal status: IN PROGRESS  4.  LEFS score to improve by at least 15 points to show improvement in functional status  Baseline:  Goal status: IN PROGRESS    PLAN:  PT FREQUENCY: 2x/week  PT DURATION: 6 weeks  PLANNED INTERVENTIONS: Therapeutic exercises, Therapeutic activity, Neuromuscular re-education, Balance training, Gait training, Patient/Family education, Self Care, Joint mobilization, Stair training, DME instructions, Aquatic Therapy, Dry Needling, Electrical stimulation, Spinal mobilization, Cryotherapy, Moist heat, Taping, Ultrasound, Ionotophoresis 4mg /ml Dexamethasone, Manual therapy, and Re-evaluation  PLAN FOR NEXT SESSION: split between land and water therapies; knee ROM, general strength and gait training, DN to hip  Darleene Cleaver, PTA 03/24/2023 8:55 AM

## 2023-03-26 ENCOUNTER — Ambulatory Visit: Payer: Medicare Other

## 2023-03-26 DIAGNOSIS — G8929 Other chronic pain: Secondary | ICD-10-CM

## 2023-03-26 DIAGNOSIS — M25561 Pain in right knee: Secondary | ICD-10-CM | POA: Diagnosis not present

## 2023-03-26 DIAGNOSIS — M25661 Stiffness of right knee, not elsewhere classified: Secondary | ICD-10-CM

## 2023-03-26 DIAGNOSIS — M25551 Pain in right hip: Secondary | ICD-10-CM

## 2023-03-26 DIAGNOSIS — M25662 Stiffness of left knee, not elsewhere classified: Secondary | ICD-10-CM

## 2023-03-26 NOTE — Therapy (Signed)
OUTPATIENT PHYSICAL THERAPY TREATMENT   Patient Name: [name] MRN: [id number] DOB:Mar 05, 1956, 67 y.o., female Today's Date: 03/26/2023  END OF SESSION:  PT End of Session - 03/26/23 0811     Visit Number 9    Number of Visits 13    Date for PT Re-Evaluation 04/09/23    Authorization Type MCR/Tricare    Authorization Time Period 02/26/23 to 04/09/23    PT Start Time 0804    PT Stop Time 0844    PT Time Calculation (min) 40 min    Activity Tolerance Patient tolerated treatment well    Behavior During Therapy [facility] for tasks assessed/performed                  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   Blood transfusion without reported diagnosis    1980's   Colon cancer (HCC) 2009   colon   GERD (gastroesophageal reflux disease)    Hiatal hernia    Hypertension    Past Surgical History:  Procedure Laterality Date   CESAREAN SECTION     1982, 1988, 1989   CHOLECYSTECTOMY  1980   COLONOSCOPY     COLONOSCOPY WITH PROPOFOL N/A 10/02/2015   Procedure: COLONOSCOPY WITH PROPOFOL;  Surgeon: Meryl Dare, MD;  Location: [facility];  Service: Endoscopy;  Laterality: N/A;   laparoscopic sleeve gastrectomy N/A    Surgeon- Greig Right Teppara   PARTIAL COLECTOMY  2009   POLYPECTOMY     TUBAL LIGATION  1989   Patient Active Problem List   Diagnosis Date Noted   Hyperlipidemia 02/11/2023   Encounter for weight management 12/18/2022   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   Sebaceous cyst 11/02/2019   OSA (obstructive sleep apnea) 03/22/2018   Trochanteric bursitis of right hip 05/14/2017   Morbid obesity (HCC) 08/01/2016   IFG (impaired fasting glucose) 07/14/2016   Primary osteoarthritis of both hands 08/03/2015   Elevated LDL cholesterol level 09/16/2013   Trigger thumb of left hand 09/16/2013   Knee osteoarthritis 09/16/2013   HTN (hypertension), benign  09/16/2013   IDA (iron deficiency anemia) 09/16/2013   Asthma 09/16/2013   Unspecified vitamin D deficiency 09/16/2013   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    PCP: Agapito Games, MD  REFERRING PROVIDER: Agapito Games, MD  REFERRING DIAG:  M79.18 (ICD-10-CM) - Right buttock pain  M17.0 (ICD-10-CM) - Primary osteoarthritis of both knees    THERAPY DIAG:  Chronic pain of right knee  Stiffness of right knee, not elsewhere classified  Chronic pain of left knee  Stiffness of left knee, not elsewhere classified  Pain in right hip  Rationale for Evaluation and Treatment: Rehabilitation  ONSET DATE: 02/09/2023  SUBJECTIVE:   SUBJECTIVE STATEMENT: Doing good, took some tylenol before coming here.  PERTINENT HISTORY:  PAIN:  Are you having pain? 3/10  bil knees  PRECAUTIONS: ICD/Pacemaker  WEIGHT BEARING RESTRICTIONS: No  FALLS:  Has patient fallen in last 6 months? No  LIVING ENVIRONMENT: Lives with: lives with their spouse Lives in: House/apartment Stairs: no STE, 7 steps up, 7 steps down split level home  Has following equipment at home: Single point cane  OCCUPATION: nurse case manager   PLOF: Independent, Independent with basic ADLs, Independent with gait, and Independent with transfers  PATIENT GOALS: know what I can  do to prevent or reduce pain flares   NEXT MD VISIT: Dr. Linford Arnold May 8th   OBJECTIVE:   DIAGNOSTIC FINDINGS:   PATIENT SURVEYS:  LEFS 13/80  COGNITION: Overall cognitive status: Within functional limits for tasks assessed     SENSATION: Not tested no complaints   MUSCLE LENGTH:  HS WNL B  Piriformis L WNL, R moderate limitation   LOWER EXTREMITY ROM:  Active ROM Right eval Left eval Right 03/11/23 Left 03/11/23 Right 03/16/23 Left 03/16/23  Hip flexion        Knee flexion 60* 75* 80 84 79 75  Knee extension 0* 0*       (Blank rows = not tested)  LOWER EXTREMITY MMT:  MMT Right eval  Left eval Right 03/26/23 Left 03/26/23  Hip flexion 3- 3- 4 4  Hip extension      Hip abduction 3 4 4  4+  Hip adduction      Hip internal rotation      Hip external rotation      Knee flexion 4 4 4+ 4+  Knee extension 4+ 4+    Ankle dorsiflexion 4+ 4+     (Blank rows = not tested)  GAIT: Distance walked: in clinic distances  Assistive device utilized: Single point cane Level of assistance: Modified independence Comments: antalgic gait pattern with wide BOS, limited stance times and step lengths   TODAY'S TREATMENT:                                                                                                                              DATE: 03/26/23 Nustep L5x32min Standing hip abduction 2x10 red TB 2 HHA Standing hip extension 2x10 red TB 2 HHA Seated marching 2# x 15 bil  Knee extension 10# x 15 BLE; 5# x 15 R/L each Knee flexion 20# x 15 BLE; 10# x 15 R/L each  Gait Training- 180 ft no AD supervision given 03/24/23 Nustep L5x76min Leg press 20# 2x10 BLE Knee flexion 20# 2x10 BLE Knee extension 10# 2x10 BLE Single leg RDL x 10  R/L Side steps red TB ankles 2x87ft Seated LAQ red TB x 15 B  03/18/23 Nustep L5x38min Seated heel slides with foot on peanut ball x 10 B 4' step ups B x 10 4' lateral step ups L Attempted wall mini squats - pt noting pain  Knee flexion 15# BLE x 10; 5# x 10 R/L Knee extension 2x10 5# Standing hip abduction x 10 2#  Standing hip extension x 10 2#  Seated PF with blue TB x 10 B  03/16/23 Nustep L5x85min Step up and over 4' step x 10  Knee flexion 10# 2x10 BLE Knee extension 5# 2x10 BLE B hip up and over sitting x 10  Seated B heel slide with strap x 10  Standing hip ABD and EXT 2x5 ea B Red TB at ankles (change to thighs) Standing hip ABD and EXT 2x5 ea B Red TB  at thighs 1x 5  03/11/23 Nustep L5x1min Step up and over 4' step x 10  Knee flexion 10# 2x10 BLE Knee extension 5# 2x10 BLE B hip up and over sitting x 10  Seated R heel  slide with strap x 10   03/09/23 Nustep L5 x 6 min (640 steps) Seated marching x 10 ea (feet on 4 inch step) Seated hip ABD RTB 2x10, 1x5 Seated knee flexion RTB loop 2x10 Sit to stand x 5 Standing step ups R onto aerobic base x 10 (pt does not want to bend knee) 8 inch step toe taps x 10 each to encourage knee flexion 8 inch step lunge stretch x 10 ea side Supine HS stretch with strap x 30 sec bil Supine ITB stretch with strap x 30 sec bil  03/04/23 Nustep L5 x 6.5 min (500 steps) Trigger Point Dry-Needling  Skilled palpation and monitoring of soft tissues during DN STM to R gluteals, piriformis and lateral quads Treatment instructions: Expect mild to moderate muscle soreness. S/S of pneumothorax if dry needled over a lung field, and to seek immediate medical attention should they occur. Patient verbalized understanding of these instructions and education. Patient Consent Given: Yes Education handout provided: Yes Muscles treated: R gluteals, piriformis, lateral quads Electrical stimulation performed: No Parameters: N/A Treatment response/outcome: Twitch Response Elicited and Palpable Increase in Muscle Length  PATIENT EDUCATION:  Education details: DN education and aftercare  Person educated: Patient Education method: Explanation, Demonstration, and Handouts Education comprehension: verbalized understanding, returned demonstration, and needs further education  HOME EXERCISE PROGRAM: Access Code: [email or web address] URL: [email or web address] Date: 03/09/2023 Prepared by: Raynelle Fanning  Exercises - Sitting Knee Extension with Resistance  - 1 x daily - 7 x weekly - 3 sets - 10 reps - Seated March with Resistance  - 1 x daily - 7 x weekly - 3 sets - 10 reps - Seated Hip Abduction with Resistance  - 1 x daily - 7 x weekly - 3 sets - 10 reps - Sit to Stand  - 3 x daily - 7 x weekly - 1-2 sets - 10 reps - Standing Hip Abduction with Resistance at Ankles and Counter Support  - 1 x daily  - 7 x weekly - 3 sets - 10 reps - Standing Hip Extension with Resistance at Ankles and Counter Support  - 1 x daily - 7 x weekly - 3 sets - 10 reps - Standing Knee Flexion Stretch on Step  - 1 x daily - 7 x weekly - 2 sets - 10 reps  ASSESSMENT:  CLINICAL IMPRESSION: Pt arrived to clinic with less pain than last visit. Strength has improved, she still shows weakness in R hip flexion and abduction. She tolerated single leg strengthening on machines today. Cues during gait to bend knees during swing phase.    OBJECTIVE IMPAIRMENTS: Abnormal gait, difficulty walking, decreased ROM, decreased strength, increased fascial restrictions, impaired flexibility, obesity, and pain.   ACTIVITY LIMITATIONS: standing, squatting, stairs, transfers, and locomotion level  PARTICIPATION LIMITATIONS: driving, shopping, community activity, occupation, and yard work  PERSONAL FACTORS: Age, Behavior pattern, Education, Fitness, Past/current experiences, Social background, and Time since onset of injury/illness/exacerbation are also affecting patient's functional outcome.   REHAB POTENTIAL: Fair chronicity of pain, obesity   CLINICAL DECISION MAKING: Stable/uncomplicated  EVALUATION COMPLEXITY: Low   GOALS: Goals reviewed with patient? Yes  SHORT TERM GOALS: Target date: 03/19/2023   Will be compliant with appropriate progressive HEP  Baseline: Goal status: IN PROGRESS only partially compliant  2.  Knee flexion ROM to improve by 15 degrees B  Baseline:  Goal status: PARTIALLY MET R knee met.  3.  R knee extension ROM to be no more than 5 degrees  Baseline:  Goal status: MET  4.  Piriformis mm spasm and flexibility impairment to be resolved  Baseline:  Goal status: MET   LONG TERM GOALS: Target date: 04/09/2023    MMT to improve by 1 grade in all weak groups  Baseline:  Goal status: IN PROGRESS- improved 03/26/23 check MMT chart  2.  Pain to be no more than 2/10 at worst in B knees and R  piriformis/glute  Baseline:  Goal status: IN PROGRESS   3.  Will tolerate advanced HEP vs gym level programming to maintain functional status and prevent recurrence of severe pain  Baseline:  Goal status: IN PROGRESS  4.  LEFS score to improve by at least 15 points to show improvement in functional status  Baseline:  Goal status: IN PROGRESS    PLAN:  PT FREQUENCY: 2x/week  PT DURATION: 6 weeks  PLANNED INTERVENTIONS: Therapeutic exercises, Therapeutic activity, Neuromuscular re-education, Balance training, Gait training, Patient/Family education, Self Care, Joint mobilization, Stair training, DME instructions, Aquatic Therapy, Dry Needling, Electrical stimulation, Spinal mobilization, Cryotherapy, Moist heat, Taping, Ultrasound, Ionotophoresis 4mg /ml Dexamethasone, Manual therapy, and Re-evaluation  PLAN FOR NEXT SESSION: split between land and water therapies; knee ROM, general strength and gait training, DN to hip  Darleene Cleaver, PTA 03/26/2023 9:49 AM

## 2023-03-31 ENCOUNTER — Ambulatory Visit: Payer: Medicare Other

## 2023-03-31 DIAGNOSIS — M25561 Pain in right knee: Secondary | ICD-10-CM | POA: Diagnosis not present

## 2023-03-31 DIAGNOSIS — G8929 Other chronic pain: Secondary | ICD-10-CM

## 2023-03-31 DIAGNOSIS — M25662 Stiffness of left knee, not elsewhere classified: Secondary | ICD-10-CM

## 2023-03-31 DIAGNOSIS — M25661 Stiffness of right knee, not elsewhere classified: Secondary | ICD-10-CM

## 2023-03-31 DIAGNOSIS — M25551 Pain in right hip: Secondary | ICD-10-CM

## 2023-03-31 NOTE — Therapy (Signed)
OUTPATIENT PHYSICAL THERAPY TREATMENT   Patient Name: [name] MRN: [id number] DOB:10-Apr-1956, 67 y.o., female Today's Date: 03/31/2023  END OF SESSION:  PT End of Session - 03/31/23 0814     Visit Number 10    Number of Visits 13    Date for PT Re-Evaluation 04/09/23    Authorization Type MCR/T[employer]    Authorization Time Period 02/26/23 to 04/09/23    PT Start Time 0803    PT Stop Time 0846    PT Time Calculation (min) 43 min    Activity Tolerance Patient tolerated treatment well    Behavior During Therapy [facility] for tasks assessed/performed                   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   Blood transfusion without reported diagnosis    1980's   Colon cancer (HCC) 2009   colon   GERD (gastroesophageal reflux disease)    Hiatal hernia    Hypertension    Past Surgical History:  Procedure Laterality Date   CESAREAN SECTION     1982, 1988, 1989   CHOLECYSTECTOMY  1980   COLONOSCOPY     COLONOSCOPY WITH PROPOFOL N/A 10/02/2015   Procedure: COLONOSCOPY WITH PROPOFOL;  Surgeon: Meryl Dare, MD;  Location: [facility];  Service: Endoscopy;  Laterality: N/A;   laparoscopic sleeve gastrectomy N/A    Surgeon- Greig Right Teppara   PARTIAL COLECTOMY  2009   POLYPECTOMY     TUBAL LIGATION  1989   Patient Active Problem List   Diagnosis Date Noted   Hyperlipidemia 02/11/2023   Encounter for weight management 12/18/2022   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   Sebaceous cyst 11/02/2019   OSA (obstructive sleep apnea) 03/22/2018   Trochanteric bursitis of right hip 05/14/2017   Morbid obesity (HCC) 08/01/2016   IFG (impaired fasting glucose) 07/14/2016   Primary osteoarthritis of both hands 08/03/2015   Elevated LDL cholesterol level 09/16/2013   Trigger thumb of left hand 09/16/2013   Knee osteoarthritis 09/16/2013   HTN (hypertension),  benign 09/16/2013   IDA (iron deficiency anemia) 09/16/2013   Asthma 09/16/2013   Unspecified vitamin D deficiency 09/16/2013   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    PCP: Agapito Games, MD  REFERRING PROVIDER: Agapito Games, MD  REFERRING DIAG:  M79.18 (ICD-10-CM) - Right buttock pain  M17.0 (ICD-10-CM) - Primary osteoarthritis of both knees    THERAPY DIAG:  Chronic pain of right knee  Stiffness of right knee, not elsewhere classified  Chronic pain of left knee  Stiffness of left knee, not elsewhere classified  Pain in right hip  Rationale for Evaluation and Treatment: Rehabilitation  ONSET DATE: 02/09/2023  SUBJECTIVE:   SUBJECTIVE STATEMENT: Doing the same in the knees today.  PERTINENT HISTORY:  PAIN:  Are you having pain? 3/10  bil knees  PRECAUTIONS: ICD/Pacemaker  WEIGHT BEARING RESTRICTIONS: No  FALLS:  Has patient fallen in last 6 months? No  LIVING ENVIRONMENT: Lives with: lives with their spouse Lives in: House/apartment Stairs: no STE, 7 steps up, 7 steps down split level home  Has following equipment at home: Single point cane  OCCUPATION: nurse case manager   PLOF: Independent, Independent with basic ADLs, Independent with gait, and Independent with transfers  PATIENT GOALS: know what I can  do to prevent or reduce pain flares   NEXT MD VISIT: Dr. Linford Arnold May 8th   OBJECTIVE:   DIAGNOSTIC FINDINGS:   PATIENT SURVEYS:  LEFS 13/80  COGNITION: Overall cognitive status: Within functional limits for tasks assessed     SENSATION: Not tested no complaints   MUSCLE LENGTH:  HS WNL B  Piriformis L WNL, R moderate limitation   LOWER EXTREMITY ROM:  Active ROM Right eval Left eval Right 03/11/23 Left 03/11/23 Right 03/16/23 Left 03/16/23 Right/Left AROM  Hip flexion         Knee flexion 60* 75* 80 84 79 75 77/83  Knee extension 0* 0*        (Blank rows = not tested)  LOWER EXTREMITY MMT:  MMT  Right eval Left eval Right 03/26/23 Left 03/26/23  Hip flexion 3- 3- 4 4  Hip extension      Hip abduction 3 4 4  4+  Hip adduction      Hip internal rotation      Hip external rotation      Knee flexion 4 4 4+ 4+  Knee extension 4+ 4+    Ankle dorsiflexion 4+ 4+     (Blank rows = not tested)  GAIT: Distance walked: in clinic distances  Assistive device utilized: Single point cane Level of assistance: Modified independence Comments: antalgic gait pattern with wide BOS, limited stance times and step lengths   TODAY'S TREATMENT:                                                                                                                              DATE: 03/31/23 Nustep L5x57min  LEFS: 34 / 80 = 42.5 % Seated heel slides AAROM  x10 bil Knee ROM assessed Standing march 2# 2x10 2HHA Standing HS curls 2# 2x10 2HHA Mini squats with 2HHA x 10  Side steps red TB 4x10 ft  03/26/23 Nustep L5x56min Standing hip abduction 2x10 red TB 2 HHA Standing hip extension 2x10 red TB 2 HHA Seated marching 2# x 15 bil  Knee extension 10# x 15 BLE; 5# x 15 R/L each Knee flexion 20# x 15 BLE; 10# x 15 R/L each  Gait Training- 180 ft no AD supervision given 03/24/23 Nustep L5x36min Leg press 20# 2x10 BLE Knee flexion 20# 2x10 BLE Knee extension 10# 2x10 BLE Single leg RDL x 10  R/L Side steps red TB ankles 2x64ft Seated LAQ red TB x 15 B  03/18/23 Nustep L5x97min Seated heel slides with foot on peanut ball x 10 B 4' step ups B x 10 4' lateral step ups L Attempted wall mini squats - pt noting pain  Knee flexion 15# BLE x 10; 5# x 10 R/L Knee extension 2x10 5# Standing hip abduction x 10 2#  Standing hip extension x 10 2#  Seated PF with blue TB x 10 B  03/16/23 Nustep L5x46min Step up and over 4' step x 10  Knee flexion  10# 2x10 BLE Knee extension 5# 2x10 BLE B hip up and over sitting x 10  Seated B heel slide with strap x 10  Standing hip ABD and EXT 2x5 ea B Red TB at ankles  (change to thighs) Standing hip ABD and EXT 2x5 ea B Red TB at thighs 1x 5  03/11/23 Nustep L5x90min Step up and over 4' step x 10  Knee flexion 10# 2x10 BLE Knee extension 5# 2x10 BLE B hip up and over sitting x 10  Seated R heel slide with strap x 10   03/09/23 Nustep L5 x 6 min (640 steps) Seated marching x 10 ea (feet on 4 inch step) Seated hip ABD RTB 2x10, 1x5 Seated knee flexion RTB loop 2x10 Sit to stand x 5 Standing step ups R onto aerobic base x 10 (pt does not want to bend knee) 8 inch step toe taps x 10 each to encourage knee flexion 8 inch step lunge stretch x 10 ea side Supine HS stretch with strap x 30 sec bil Supine ITB stretch with strap x 30 sec bil  03/04/23 Nustep L5 x 6.5 min (500 steps) Trigger Point Dry-Needling  Skilled palpation and monitoring of soft tissues during DN STM to R gluteals, piriformis and lateral quads Treatment instructions: Expect mild to moderate muscle soreness. S/S of pneumothorax if dry needled over a lung field, and to seek immediate medical attention should they occur. Patient verbalized understanding of these instructions and education. Patient Consent Given: Yes Education handout provided: Yes Muscles treated: R gluteals, piriformis, lateral quads Electrical stimulation performed: No Parameters: N/A Treatment response/outcome: Twitch Response Elicited and Palpable Increase in Muscle Length  PATIENT EDUCATION:  Education details: DN education and aftercare  Person educated: Patient Education method: Explanation, Demonstration, and Handouts Education comprehension: verbalized understanding, returned demonstration, and needs further education  HOME EXERCISE PROGRAM: Access Code: [email or web address] URL: [email or web address] Date: 03/09/2023 Prepared by: Raynelle Fanning  Exercises - Sitting Knee Extension with Resistance  - 1 x daily - 7 x weekly - 3 sets - 10 reps - Seated March with Resistance  - 1 x daily - 7 x weekly - 3 sets - 10  reps - Seated Hip Abduction with Resistance  - 1 x daily - 7 x weekly - 3 sets - 10 reps - Sit to Stand  - 3 x daily - 7 x weekly - 1-2 sets - 10 reps - Standing Hip Abduction with Resistance at Ankles and Counter Support  - 1 x daily - 7 x weekly - 3 sets - 10 reps - Standing Hip Extension with Resistance at Ankles and Counter Support  - 1 x daily - 7 x weekly - 3 sets - 10 reps - Standing Knee Flexion Stretch on Step  - 1 x daily - 7 x weekly - 2 sets - 10 reps  ASSESSMENT:  CLINICAL IMPRESSION: Pt has progressed well with PT. She shows improved LE strength from last visit. She has met her goal for ROM on R knee but not the L. Pain is reported to be 6/10 at the worst and today she reported 3/10 pain. LEFS was taken today. Cues with standing therex to avoid leaning over to emphasize upright posture. She is limited with closed chain knee flexion d/t pain and will start aquatic therapy next week, she would continue to benefit from skilled therapy.    I was present in the clinic during this treatment and agree with assessment above.   OBJECTIVE IMPAIRMENTS: Abnormal gait, difficulty  walking, decreased ROM, decreased strength, increased fascial restrictions, impaired flexibility, obesity, and pain.   ACTIVITY LIMITATIONS: standing, squatting, stairs, transfers, and locomotion level  PARTICIPATION LIMITATIONS: driving, shopping, community activity, occupation, and yard work  PERSONAL FACTORS: Age, Behavior pattern, Education, Fitness, Past/current experiences, Social background, and Time since onset of injury/illness/exacerbation are also affecting patient's functional outcome.   REHAB POTENTIAL: Fair chronicity of pain, obesity   CLINICAL DECISION MAKING: Stable/uncomplicated  EVALUATION COMPLEXITY: Low   GOALS: Goals reviewed with patient? Yes  SHORT TERM GOALS: Target date: 03/19/2023   Will be compliant with appropriate progressive HEP  Baseline: Goal status: IN PROGRESS only  partially compliant  2.  Knee flexion ROM to improve by 15 degrees B  Baseline:  Goal status: PARTIALLY MET R knee met.  3.  R knee extension ROM to be no more than 5 degrees  Baseline:  Goal status: MET  4.  Piriformis mm spasm and flexibility impairment to be resolved  Baseline:  Goal status: MET   LONG TERM GOALS: Target date: 04/09/2023    MMT to improve by 1 grade in all weak groups  Baseline:  Goal status: IN PROGRESS- improved 03/26/23 check MMT chart  2.  Pain to be no more than 2/10 at worst in B knees and R piriformis/glute  Baseline:  Goal status: IN PROGRESS - 03/31/23 6/10 at worst  3.  Will tolerate advanced HEP vs gym level programming to maintain functional status and prevent recurrence of severe pain  Baseline:  Goal status: IN PROGRESS  4.  LEFS score to improve by at least 15 points to show improvement in functional status  Baseline:  Goal status: IN PROGRESS-  03/31/23; 34 / 80 = 42.5 %    PLAN:  PT FREQUENCY: 2x/week  PT DURATION: 6 weeks  PLANNED INTERVENTIONS: Therapeutic exercises, Therapeutic activity, Neuromuscular re-education, Balance training, Gait training, Patient/Family education, Self Care, Joint mobilization, Stair training, DME instructions, Aquatic Therapy, Dry Needling, Electrical stimulation, Spinal mobilization, Cryotherapy, Moist heat, Taping, Ultrasound, Ionotophoresis 4mg /ml Dexamethasone, Manual therapy, and Re-evaluation  PLAN FOR NEXT SESSION: split between land and water therapies; knee ROM, general strength and gait training, DN to hip  Darleene Cleaver, PTA 03/31/2023 8:46 AM

## 2023-04-02 ENCOUNTER — Other Ambulatory Visit: Payer: Self-pay

## 2023-04-02 ENCOUNTER — Ambulatory Visit: Payer: Medicare Other

## 2023-04-02 DIAGNOSIS — M25561 Pain in right knee: Secondary | ICD-10-CM | POA: Diagnosis not present

## 2023-04-02 DIAGNOSIS — M25661 Stiffness of right knee, not elsewhere classified: Secondary | ICD-10-CM

## 2023-04-02 DIAGNOSIS — G8929 Other chronic pain: Secondary | ICD-10-CM

## 2023-04-02 DIAGNOSIS — M25662 Stiffness of left knee, not elsewhere classified: Secondary | ICD-10-CM

## 2023-04-02 DIAGNOSIS — M25551 Pain in right hip: Secondary | ICD-10-CM

## 2023-04-02 NOTE — Therapy (Signed)
OUTPATIENT PHYSICAL THERAPY TREATMENT   Patient Name: [name] MRN: [id number] DOB:[birth year], 67 y.o., female Today's Date: 04/02/2023  END OF SESSION:  PT End of Session - 04/02/23 0816     Visit Number 11    Number of Visits 13    Date for PT Re-Evaluation 04/09/23    Authorization Type MCR/Tricare    Authorization Time Period 02/26/23 to 04/09/23    PT Start Time 0814    PT Stop Time 0846    PT Time Calculation (min) 32 min    Activity Tolerance Patient tolerated treatment well    Behavior During Therapy [facility] for tasks assessed/performed                   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   Blood transfusion without reported diagnosis    1980's   Colon cancer (HCC) 2009   colon   GERD (gastroesophageal reflux disease)    Hiatal hernia    Hypertension    Past Surgical History:  Procedure Laterality Date   CESAREAN SECTION     1982, 1988, 1989   CHOLECYSTECTOMY  1980   COLONOSCOPY     COLONOSCOPY WITH PROPOFOL N/A 10/02/2015   Procedure: COLONOSCOPY WITH PROPOFOL;  Surgeon: Meryl Dare, MD;  Location: [facility];  Service: Endoscopy;  Laterality: N/A;   laparoscopic sleeve gastrectomy N/A    Surgeon- Greig Right Teppara   PARTIAL COLECTOMY  2009   POLYPECTOMY     TUBAL LIGATION  1989   Patient Active Problem List   Diagnosis Date Noted   Hyperlipidemia 02/11/2023   Encounter for weight management 12/18/2022   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   Sebaceous cyst 11/02/2019   OSA (obstructive sleep apnea) 03/22/2018   Trochanteric bursitis of right hip 05/14/2017   Morbid obesity (HCC) 08/01/2016   IFG (impaired fasting glucose) 07/14/2016   Primary osteoarthritis of both hands 08/03/2015   Elevated LDL cholesterol level 09/16/2013   Trigger thumb of left hand 09/16/2013   Knee osteoarthritis 09/16/2013   HTN (hypertension),  benign 09/16/2013   IDA (iron deficiency anemia) 09/16/2013   Asthma 09/16/2013   Unspecified vitamin D deficiency 09/16/2013   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    PCP: Agapito Games, MD  REFERRING PROVIDER: Agapito Games, MD  REFERRING DIAG:  M79.18 (ICD-10-CM) - Right buttock pain  M17.0 (ICD-10-CM) - Primary osteoarthritis of both knees    THERAPY DIAG:  Chronic pain of right knee  Stiffness of right knee, not elsewhere classified  Chronic pain of left knee  Stiffness of left knee, not elsewhere classified  Pain in right hip  Rationale for Evaluation and Treatment: Rehabilitation  ONSET DATE: 02/09/2023  SUBJECTIVE:   SUBJECTIVE STATEMENT: Felt light headed this am , might be dehydrated  PERTINENT HISTORY:  PAIN:  Are you having pain? 3/10  bil knees  PRECAUTIONS: ICD/Pacemaker  WEIGHT BEARING RESTRICTIONS: No  FALLS:  Has patient fallen in last 6 months? No  LIVING ENVIRONMENT: Lives with: lives with their spouse Lives in: House/apartment Stairs: no STE, 7 steps up, 7 steps down split level home  Has following equipment at home: Single point cane  OCCUPATION: nurse case manager   PLOF: Independent, Independent with basic ADLs, Independent with gait, and Independent with transfers  PATIENT GOALS: know what  I can do to prevent or reduce pain flares   NEXT MD VISIT: Dr. Linford Arnold May 8th   OBJECTIVE:   DIAGNOSTIC FINDINGS:   PATIENT SURVEYS:  LEFS 13/80  COGNITION: Overall cognitive status: Within functional limits for tasks assessed     SENSATION: Not tested no complaints   MUSCLE LENGTH:  HS WNL B  Piriformis L WNL, R moderate limitation   LOWER EXTREMITY ROM:  Active ROM Right eval Left eval Right 03/11/23 Left 03/11/23 Right 03/16/23 Left 03/16/23 Right/Left AROM  Hip flexion         Knee flexion 60* 75* 80 84 79 75 77/83  Knee extension 0* 0*        (Blank rows = not tested)  LOWER EXTREMITY  MMT:  MMT Right eval Left eval Right 03/26/23 Left 03/26/23  Hip flexion 3- 3- 4 4  Hip extension      Hip abduction 3 4 4  4+  Hip adduction      Hip internal rotation      Hip external rotation      Knee flexion 4 4 4+ 4+  Knee extension 4+ 4+    Ankle dorsiflexion 4+ 4+     (Blank rows = not tested)  GAIT: Distance walked: in clinic distances  Assistive device utilized: Single point cane Level of assistance: Modified independence Comments: antalgic gait pattern with wide BOS, limited stance times and step lengths   TODAY'S TREATMENT:                                                                                                                              DATE: 04/02/23: Nustep L5, 6 min Side steps green TB 4x10 ft Knee extension 10# x 15 BLE; 5# x 15 R/L each Knee flexion 20# x 15 BLE; 10# x 15 R/L each Standing march 2# 2x10 2HHA Standing HS curls 2# 2x10 2HHA Standing for B toe raises and B plantar flexor stretch with forefeet on 1" block, 2 x 15 Seated for L lateral distal quads cross friction massage, myofascial release  03/31/23 Nustep L5x51min  LEFS: 34 / 80 = 42.5 % Seated heel slides AAROM  x10 bil Knee ROM assessed Standing march 2# 2x10 2HHA Standing HS curls 2# 2x10 2HHA Mini squats with 2HHA x 10  Side steps red TB 4x10 ft  03/26/23 Nustep L5x23min Standing hip abduction 2x10 red TB 2 HHA Standing hip extension 2x10 red TB 2 HHA Seated marching 2# x 15 bil  Knee extension 10# x 15 BLE; 5# x 15 R/L each Knee flexion 20# x 15 BLE; 10# x 15 R/L each  Gait Training- 180 ft no AD supervision given  03/24/23 Nustep L5x48min Leg press 20# 2x10 BLE Knee flexion 20# 2x10 BLE Knee extension 10# 2x10 BLE Single leg RDL x 10  R/L Side steps red TB ankles 2x4ft Seated LAQ red TB x 15 B  03/18/23 Nustep L5x11min Seated heel slides with foot  on peanut ball x 10 B 4' step ups B x 10 4' lateral step ups L Attempted wall mini squats - pt noting pain  Knee  flexion 15# BLE x 10; 5# x 10 R/L Knee extension 2x10 5# Standing hip abduction x 10 2#  Standing hip extension x 10 2#  Seated PF with blue TB x 10 B  03/16/23 Nustep L5x75min Step up and over 4' step x 10  Knee flexion 10# 2x10 BLE Knee extension 5# 2x10 BLE B hip up and over sitting x 10  Seated B heel slide with strap x 10  Standing hip ABD and EXT 2x5 ea B Red TB at ankles (change to thighs) Standing hip ABD and EXT 2x5 ea B Red TB at thighs 1x 5  03/11/23 Nustep L5x70min Step up and over 4' step x 10  Knee flexion 10# 2x10 BLE Knee extension 5# 2x10 BLE B hip up and over sitting x 10  Seated R heel slide with strap x 10   03/09/23 Nustep L5 x 6 min (640 steps) Seated marching x 10 ea (feet on 4 inch step) Seated hip ABD RTB 2x10, 1x5 Seated knee flexion RTB loop 2x10 Sit to stand x 5 Standing step ups R onto aerobic base x 10 (pt does not want to bend knee) 8 inch step toe taps x 10 each to encourage knee flexion 8 inch step lunge stretch x 10 ea side Supine HS stretch with strap x 30 sec bil Supine ITB stretch with strap x 30 sec bil  03/04/23 Nustep L5 x 6.5 min (500 steps) Trigger Point Dry-Needling  Skilled palpation and monitoring of soft tissues during DN STM to R gluteals, piriformis and lateral quads Treatment instructions: Expect mild to moderate muscle soreness. S/S of pneumothorax if dry needled over a lung field, and to seek immediate medical attention should they occur. Patient verbalized understanding of these instructions and education. Patient Consent Given: Yes Education handout provided: Yes Muscles treated: R gluteals, piriformis, lateral quads Electrical stimulation performed: No Parameters: N/A Treatment response/outcome: Twitch Response Elicited and Palpable Increase in Muscle Length  PATIENT EDUCATION:  Education details: DN education and aftercare  Person educated: Patient Education method: Explanation, Demonstration, and Handouts Education  comprehension: verbalized understanding, returned demonstration, and needs further education  HOME EXERCISE PROGRAM: Access Code: [email or web address] URL: [email or web address] Date: 03/09/2023 Prepared by: Raynelle Fanning  Exercises - Sitting Knee Extension with Resistance  - 1 x daily - 7 x weekly - 3 sets - 10 reps - Seated March with Resistance  - 1 x daily - 7 x weekly - 3 sets - 10 reps - Seated Hip Abduction with Resistance  - 1 x daily - 7 x weekly - 3 sets - 10 reps - Sit to Stand  - 3 x daily - 7 x weekly - 1-2 sets - 10 reps - Standing Hip Abduction with Resistance at Ankles and Counter Support  - 1 x daily - 7 x weekly - 3 sets - 10 reps - Standing Hip Extension with Resistance at Ankles and Counter Support  - 1 x daily - 7 x weekly - 3 sets - 10 reps - Standing Knee Flexion Stretch on Step  - 1 x daily - 7 x weekly - 2 sets - 10 reps  ASSESSMENT:  CLINICAL IMPRESSION: Pt has progressed well with PT. Arrived today 14 min late so abbreviated session.  Did encourage her and provided water today due to her reports of light headed, ? Low  blood pressure.  Tolerated therex well.  Increased resistance with lateral strengthening hips.  Added more ankle strength, flexibilityShe shows improved LE strength from last visit. She has met her goal for ROM on R knee but not the L. Pain is reported to be 6/10 at the worst and today she reported 3/10 pain. LEFS was taken today. Cues with standing therex to avoid leaning over to emphasize upright posture. She is limited with closed chain knee flexion d/t pain and will start aquatic therapy next week, she would continue to benefit from skilled therapy.     OBJECTIVE IMPAIRMENTS: Abnormal gait, difficulty walking, decreased ROM, decreased strength, increased fascial restrictions, impaired flexibility, obesity, and pain.   ACTIVITY LIMITATIONS: standing, squatting, stairs, transfers, and locomotion level  PARTICIPATION LIMITATIONS: driving, shopping,  community activity, occupation, and yard work  PERSONAL FACTORS: Age, Behavior pattern, Education, Fitness, Past/current experiences, Social background, and Time since onset of injury/illness/exacerbation are also affecting patient's functional outcome.   REHAB POTENTIAL: Fair chronicity of pain, obesity   CLINICAL DECISION MAKING: Stable/uncomplicated  EVALUATION COMPLEXITY: Low   GOALS: Goals reviewed with patient? Yes  SHORT TERM GOALS: Target date: 03/19/2023   Will be compliant with appropriate progressive HEP  Baseline: Goal status: IN PROGRESS only partially compliant  2.  Knee flexion ROM to improve by 15 degrees B  Baseline:  Goal status: PARTIALLY MET R knee met.  3.  R knee extension ROM to be no more than 5 degrees  Baseline:  Goal status: MET  4.  Piriformis mm spasm and flexibility impairment to be resolved  Baseline:  Goal status: MET   LONG TERM GOALS: Target date: 04/09/2023    MMT to improve by 1 grade in all weak groups  Baseline:  Goal status: IN PROGRESS- improved 03/26/23 check MMT chart  2.  Pain to be no more than 2/10 at worst in B knees and R piriformis/glute  Baseline:  Goal status: IN PROGRESS - 03/31/23 6/10 at worst  3.  Will tolerate advanced HEP vs gym level programming to maintain functional status and prevent recurrence of severe pain  Baseline:  Goal status: IN PROGRESS  4.  LEFS score to improve by at least 15 points to show improvement in functional status  Baseline:  Goal status: IN PROGRESS-  03/31/23; 34 / 80 = 42.5 %    PLAN:  PT FREQUENCY: 2x/week  PT DURATION: 6 weeks  PLANNED INTERVENTIONS: Therapeutic exercises, Therapeutic activity, Neuromuscular re-education, Balance training, Gait training, Patient/Family education, Self Care, Joint mobilization, Stair training, DME instructions, Aquatic Therapy, Dry Needling, Electrical stimulation, Spinal mobilization, Cryotherapy, Moist heat, Taping, Ultrasound, Ionotophoresis  4mg /ml Dexamethasone, Manual therapy, and Re-evaluation  PLAN FOR NEXT SESSION: split between land and water therapies; knee ROM, general strength and gait training, DN to hip  Fantashia Shupert L Teegan Guinther, PT 04/02/2023 11:57 AM

## 2023-04-06 ENCOUNTER — Ambulatory Visit (HOSPITAL_BASED_OUTPATIENT_CLINIC_OR_DEPARTMENT_OTHER): Payer: Medicare Other | Attending: Family Medicine | Admitting: Physical Therapy

## 2023-04-06 ENCOUNTER — Encounter (HOSPITAL_BASED_OUTPATIENT_CLINIC_OR_DEPARTMENT_OTHER): Payer: Self-pay | Admitting: Physical Therapy

## 2023-04-06 DIAGNOSIS — M25561 Pain in right knee: Secondary | ICD-10-CM | POA: Insufficient documentation

## 2023-04-06 DIAGNOSIS — M25661 Stiffness of right knee, not elsewhere classified: Secondary | ICD-10-CM | POA: Diagnosis present

## 2023-04-06 DIAGNOSIS — G8929 Other chronic pain: Secondary | ICD-10-CM | POA: Diagnosis present

## 2023-04-06 DIAGNOSIS — M25562 Pain in left knee: Secondary | ICD-10-CM | POA: Diagnosis present

## 2023-04-06 DIAGNOSIS — M25662 Stiffness of left knee, not elsewhere classified: Secondary | ICD-10-CM | POA: Diagnosis present

## 2023-04-06 DIAGNOSIS — M25551 Pain in right hip: Secondary | ICD-10-CM | POA: Diagnosis present

## 2023-04-06 NOTE — Therapy (Signed)
OUTPATIENT PHYSICAL THERAPY TREATMENT   Patient Name: MIRLA JARES MRN: [id number] DOB:[birth year], 67 y.o., female Today's Date: 04/06/2023  END OF SESSION:  PT End of Session - 04/06/23 0831     Visit Number 12    Number of Visits 13    Date for PT Re-Evaluation 04/09/23    Authorization Type MCR/Tricare    Authorization Time Period 02/26/23 to 04/09/23    PT Start Time 0829   Pt arrives late   PT Stop Time 0900    PT Time Calculation (min) 31 min    Activity Tolerance Patient tolerated treatment well    Behavior During Therapy [facility] for tasks assessed/performed                   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   Blood transfusion without reported diagnosis    1980's   Colon cancer (HCC) 2009   colon   GERD (gastroesophageal reflux disease)    Hiatal hernia    Hypertension    Past Surgical History:  Procedure Laterality Date   CESAREAN SECTION     1982, 1988, 1989   CHOLECYSTECTOMY  1980   COLONOSCOPY     COLONOSCOPY WITH PROPOFOL N/A 10/02/2015   Procedure: COLONOSCOPY WITH PROPOFOL;  Surgeon: Meryl Dare, MD;  Location: [facility];  Service: Endoscopy;  Laterality: N/A;   laparoscopic sleeve gastrectomy N/A    Surgeon- Greig Right Teppara   PARTIAL COLECTOMY  2009   POLYPECTOMY     TUBAL LIGATION  1989   Patient Active Problem List   Diagnosis Date Noted   Hyperlipidemia 02/11/2023   Encounter for weight management 12/18/2022   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   Sebaceous cyst 11/02/2019   OSA (obstructive sleep apnea) 03/22/2018   Trochanteric bursitis of right hip 05/14/2017   Morbid obesity (HCC) 08/01/2016   IFG (impaired fasting glucose) 07/14/2016   Primary osteoarthritis of both hands 08/03/2015   Elevated LDL cholesterol level 09/16/2013   Trigger thumb of left hand 09/16/2013   Knee osteoarthritis 09/16/2013   HTN  (hypertension), benign 09/16/2013   IDA (iron deficiency anemia) 09/16/2013   Asthma 09/16/2013   Unspecified vitamin D deficiency 09/16/2013   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    PCP: Agapito Games, MD  REFERRING PROVIDER: Agapito Games, MD  REFERRING DIAG:  M79.18 (ICD-10-CM) - Right buttock pain  M17.0 (ICD-10-CM) - Primary osteoarthritis of both knees    THERAPY DIAG:  Chronic pain of right knee  Stiffness of right knee, not elsewhere classified  Chronic pain of left knee  Rationale for Evaluation and Treatment: Rehabilitation  ONSET DATE: 02/09/2023  SUBJECTIVE:   SUBJECTIVE STATEMENT: Got lost feeling about the same as usual"  PERTINENT HISTORY:  PAIN:  Are you having pain? 3/10  bil knees  PRECAUTIONS: ICD/Pacemaker  WEIGHT BEARING RESTRICTIONS: No  FALLS:  Has patient fallen in last 6 months? No  LIVING ENVIRONMENT: Lives with: lives with their spouse Lives in: House/apartment Stairs: no STE, 7 steps up, 7 steps down split level home  Has following equipment at home: Single point cane  OCCUPATION: nurse case manager   PLOF: Independent, Independent with basic ADLs, Independent with gait, and Independent with transfers  PATIENT GOALS: know what I can do to prevent or reduce pain flares  NEXT MD VISIT: Dr. Linford Arnold May 8th   OBJECTIVE:   DIAGNOSTIC FINDINGS:   PATIENT SURVEYS:  LEFS 13/80  COGNITION: Overall cognitive status: Within functional limits for tasks assessed     SENSATION: Not tested no complaints   MUSCLE LENGTH:  HS WNL B  Piriformis L WNL, R moderate limitation   LOWER EXTREMITY ROM:  Active ROM Right eval Left eval Right 03/11/23 Left 03/11/23 Right 03/16/23 Left 03/16/23 Right/Left AROM  Hip flexion         Knee flexion 60* 75* 80 84 79 75 77/83  Knee extension 0* 0*        (Blank rows = not tested)  LOWER EXTREMITY MMT:  MMT Right eval Left eval Right 03/26/23  Left 03/26/23  Hip flexion 3- 3- 4 4  Hip extension      Hip abduction 3 4 4  4+  Hip adduction      Hip internal rotation      Hip external rotation      Knee flexion 4 4 4+ 4+  Knee extension 4+ 4+    Ankle dorsiflexion 4+ 4+     (Blank rows = not tested)  GAIT: Distance walked: in clinic distances  Assistive device utilized: Single point cane Level of assistance: Modified independence Comments: antalgic gait pattern with wide BOS, limited stance times and step lengths   TODAY'S TREATMENT:                                                                                                                              DATE: 04/06/23  Pt seen for aquatic therapy today.  Treatment took place in water 3.5-4.75 ft in depth at the [facility] pool. Temp of water was 91.  Pt entered/exited the pool via stair using step to pattern with hand rail.  *intro to setting *UE support barbell: forward walking x 4 widths, back and side stepping x 2.  Slight unsteadiness with backward *standing ue support wall 3.6 ft: toe raises, heel raises, high knee marching; hip extension, hip add/abd; relaxed squats  - wall and HB support: hip circles cw&ccw R/L 2 set x 10-12 *Gait training ues support yell HB: forward (cues heel toe); backward (toe heel increased step length) *hip hiking bottom step R/L x 10   Pt requires the buoyancy and hydrostatic pressure of water for support, and to offload joints by unweighting joint load by at least 50 % in navel deep water and by at least 75-80% in chest to neck deep water.  Viscosity of the water is needed for resistance of strengthening. Water current perturbations provides challenge to standing balance requiring increased core activation.     04/02/23: Nustep L5, 6 min Side steps green TB 4x10 ft Knee extension 10# x 15 BLE; 5# x 15 R/L each Knee flexion 20# x 15 BLE; 10# x 15 R/L each Standing march 2# 2x10 2HHA Standing HS curls 2# 2x10 2HHA Standing  for  B toe raises and B plantar flexor stretch with forefeet on 1" block, 2 x 15 Seated for L lateral distal quads cross friction massage, myofascial release  03/31/23 Nustep L5x68min  LEFS: 34 / 80 = 42.5 % Seated heel slides AAROM  x10 bil Knee ROM assessed Standing march 2# 2x10 2HHA Standing HS curls 2# 2x10 2HHA Mini squats with 2HHA x 10  Side steps red TB 4x10 ft  03/26/23 Nustep L5x57min Standing hip abduction 2x10 red TB 2 HHA Standing hip extension 2x10 red TB 2 HHA Seated marching 2# x 15 bil  Knee extension 10# x 15 BLE; 5# x 15 R/L each Knee flexion 20# x 15 BLE; 10# x 15 R/L each  Gait Training- 180 ft no AD supervision given  03/24/23 Nustep L5x60min Leg press 20# 2x10 BLE Knee flexion 20# 2x10 BLE Knee extension 10# 2x10 BLE Single leg RDL x 10  R/L Side steps red TB ankles 2x62ft Seated LAQ red TB x 15 B  03/18/23 Nustep L5x90min Seated heel slides with foot on peanut ball x 10 B 4' step ups B x 10 4' lateral step ups L Attempted wall mini squats - pt noting pain  Knee flexion 15# BLE x 10; 5# x 10 R/L Knee extension 2x10 5# Standing hip abduction x 10 2#  Standing hip extension x 10 2#  Seated PF with blue TB x 10 B  03/16/23 Nustep L5x19min Step up and over 4' step x 10  Knee flexion 10# 2x10 BLE Knee extension 5# 2x10 BLE B hip up and over sitting x 10  Seated B heel slide with strap x 10  Standing hip ABD and EXT 2x5 ea B Red TB at ankles (change to thighs) Standing hip ABD and EXT 2x5 ea B Red TB at thighs 1x 5  03/11/23 Nustep L5x57min Step up and over 4' step x 10  Knee flexion 10# 2x10 BLE Knee extension 5# 2x10 BLE B hip up and over sitting x 10  Seated R heel slide with strap x 10   03/09/23 Nustep L5 x 6 min (640 steps) Seated marching x 10 ea (feet on 4 inch step) Seated hip ABD RTB 2x10, 1x5 Seated knee flexion RTB loop 2x10 Sit to stand x 5 Standing step ups R onto aerobic base x 10 (pt does not want to bend knee) 8 inch step toe taps  x 10 each to encourage knee flexion 8 inch step lunge stretch x 10 ea side Supine HS stretch with strap x 30 sec bil Supine ITB stretch with strap x 30 sec bil  03/04/23 Nustep L5 x 6.5 min (500 steps) Trigger Point Dry-Needling  Skilled palpation and monitoring of soft tissues during DN STM to R gluteals, piriformis and lateral quads Treatment instructions: Expect mild to moderate muscle soreness. S/S of pneumothorax if dry needled over a lung field, and to seek immediate medical attention should they occur. Patient verbalized understanding of these instructions and education. Patient Consent Given: Yes Education handout provided: Yes Muscles treated: R gluteals, piriformis, lateral quads Electrical stimulation performed: No Parameters: N/A Treatment response/outcome: Twitch Response Elicited and Palpable Increase in Muscle Length  PATIENT EDUCATION:  Education details: DN education and aftercare  Person educated: Patient Education method: Explanation, Demonstration, and Handouts Education comprehension: verbalized understanding, returned demonstration, and needs further education  HOME EXERCISE PROGRAM: Access Code: [email or web address] URL: [email or web address] Date: 03/09/2023 Prepared by: Raynelle Fanning  Exercises - Sitting Knee Extension with Resistance  - 1 x daily - 7  x weekly - 3 sets - 10 reps - Seated March with Resistance  - 1 x daily - 7 x weekly - 3 sets - 10 reps - Seated Hip Abduction with Resistance  - 1 x daily - 7 x weekly - 3 sets - 10 reps - Sit to Stand  - 3 x daily - 7 x weekly - 1-2 sets - 10 reps - Standing Hip Abduction with Resistance at Ankles and Counter Support  - 1 x daily - 7 x weekly - 3 sets - 10 reps - Standing Hip Extension with Resistance at Ankles and Counter Support  - 1 x daily - 7 x weekly - 3 sets - 10 reps - Standing Knee Flexion Stretch on Step  - 1 x daily - 7 x weekly - 2 sets - 10 reps  ASSESSMENT:  CLINICAL IMPRESSION: Pt arrives late.   She demonstrates safety and indep in setting with therapist instructing on deck. Pt directed through general ROM and strengthening of LE (hip/knee) assessing confidence ability and ROM. Pain 4/10 upon initiation then slight reduction as session continues. Hip and core weakness evident with maintaining standing balance submerged.  Cues to avoid muscle substitution and proper posture to improve movement patterns for proper execution of exercises.  She tolerates very well.  Goals ongoing      OBJECTIVE IMPAIRMENTS: Abnormal gait, difficulty walking, decreased ROM, decreased strength, increased fascial restrictions, impaired flexibility, obesity, and pain.   ACTIVITY LIMITATIONS: standing, squatting, stairs, transfers, and locomotion level  PARTICIPATION LIMITATIONS: driving, shopping, community activity, occupation, and yard work  PERSONAL FACTORS: Age, Behavior pattern, Education, Fitness, Past/current experiences, Social background, and Time since onset of injury/illness/exacerbation are also affecting patient's functional outcome.   REHAB POTENTIAL: Fair chronicity of pain, obesity   CLINICAL DECISION MAKING: Stable/uncomplicated  EVALUATION COMPLEXITY: Low   GOALS: Goals reviewed with patient? Yes  SHORT TERM GOALS: Target date: 03/19/2023   Will be compliant with appropriate progressive HEP  Baseline: Goal status: IN PROGRESS only partially compliant  2.  Knee flexion ROM to improve by 15 degrees B  Baseline:  Goal status: PARTIALLY MET R knee met.  3.  R knee extension ROM to be no more than 5 degrees  Baseline:  Goal status: MET  4.  Piriformis mm spasm and flexibility impairment to be resolved  Baseline:  Goal status: MET   LONG TERM GOALS: Target date: 04/09/2023    MMT to improve by 1 grade in all weak groups  Baseline:  Goal status: IN PROGRESS- improved 03/26/23 check MMT chart  2.  Pain to be no more than 2/10 at worst in B knees and R piriformis/glute   Baseline:  Goal status: IN PROGRESS - 03/31/23 6/10 at worst  3.  Will tolerate advanced HEP vs gym level programming to maintain functional status and prevent recurrence of severe pain  Baseline:  Goal status: IN PROGRESS  4.  LEFS score to improve by at least 15 points to show improvement in functional status  Baseline:  Goal status: IN PROGRESS-  03/31/23; 34 / 80 = 42.5 %    PLAN:  PT FREQUENCY: 2x/week  PT DURATION: 6 weeks  PLANNED INTERVENTIONS: Therapeutic exercises, Therapeutic activity, Neuromuscular re-education, Balance training, Gait training, Patient/Family education, Self Care, Joint mobilization, Stair training, DME instructions, Aquatic Therapy, Dry Needling, Electrical stimulation, Spinal mobilization, Cryotherapy, Moist heat, Taping, Ultrasound, Ionotophoresis 4mg /ml Dexamethasone, Manual therapy, and Re-evaluation  PLAN FOR NEXT SESSION: split between land and water therapies; knee ROM,  general strength and gait training, DN to hip  Corrie Dandy Tomma Lightning) Trayquan Kolakowski MPT 04/06/2023 10:51 AM

## 2023-04-09 ENCOUNTER — Ambulatory Visit: Payer: Medicare Other | Attending: Family Medicine

## 2023-04-09 DIAGNOSIS — M25662 Stiffness of left knee, not elsewhere classified: Secondary | ICD-10-CM | POA: Insufficient documentation

## 2023-04-09 DIAGNOSIS — M25562 Pain in left knee: Secondary | ICD-10-CM | POA: Insufficient documentation

## 2023-04-09 DIAGNOSIS — M25551 Pain in right hip: Secondary | ICD-10-CM | POA: Diagnosis present

## 2023-04-09 DIAGNOSIS — M25661 Stiffness of right knee, not elsewhere classified: Secondary | ICD-10-CM | POA: Diagnosis present

## 2023-04-09 DIAGNOSIS — G8929 Other chronic pain: Secondary | ICD-10-CM | POA: Diagnosis present

## 2023-04-09 DIAGNOSIS — M25561 Pain in right knee: Secondary | ICD-10-CM | POA: Diagnosis present

## 2023-04-09 NOTE — Therapy (Addendum)
OUTPATIENT PHYSICAL THERAPY TREATMENT Progress Note Reporting Period 02/26/23 to 04/09/23  See note below for Objective Data and Assessment of Progress/Goals.    I have reviewed progress towards goals and patient is making good progress, meeting LTG #3 and #4.  Her LEFS has improved from 13/80 at evaluation to 34/80 representing significant improvement in functional ability.  She is still limited by pain and would benefit from continued skilled physical therapy, especially to allow for more aquatic therapy.  Extending POC for additional 3 weeks for 2x per week to continue addressing functional deficits.   Jena Gauss, PT, DPT     Patient Name: [name] MRN: [id number] DOB:January 26, 1956, 67 y.o., female Today's Date: 04/09/2023  END OF SESSION:  PT End of Session - 04/09/23 0820     Visit Number 13    Number of Visits 19    Date for PT Re-Evaluation 04/30/23    Authorization Type MCR/Tricare    Authorization Time Period 02/26/23 to 04/09/23    PT Start Time 0810   pt late   PT Stop Time 0852    PT Time Calculation (min) 42 min    Activity Tolerance Patient tolerated treatment well    Behavior During Therapy [facility] for tasks assessed/performed                   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   Blood transfusion without reported diagnosis    1980's   Colon cancer (HCC) 2009   colon   GERD (gastroesophageal reflux disease)    Hiatal hernia    Hypertension    Past Surgical History:  Procedure Laterality Date   CESAREAN SECTION     1982, 1988, 1989   CHOLECYSTECTOMY  1980   COLONOSCOPY     COLONOSCOPY WITH PROPOFOL N/A 10/02/2015   Procedure: COLONOSCOPY WITH PROPOFOL;  Surgeon: Meryl Dare, MD;  Location: [facility];  Service: Endoscopy;  Laterality: N/A;   laparoscopic sleeve gastrectomy N/A    Surgeon- Greig Right Teppara   PARTIAL COLECTOMY   2009   POLYPECTOMY     TUBAL LIGATION  1989   Patient Active Problem List   Diagnosis Date Noted   Hyperlipidemia 02/11/2023   Encounter for weight management 12/18/2022   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   Sebaceous cyst 11/02/2019   OSA (obstructive sleep apnea) 03/22/2018   Trochanteric bursitis of right hip 05/14/2017   Morbid obesity (HCC) 08/01/2016   IFG (impaired fasting glucose) 07/14/2016   Primary osteoarthritis of both hands 08/03/2015   Elevated LDL cholesterol level 09/16/2013   Trigger thumb of left hand 09/16/2013   Knee osteoarthritis 09/16/2013   HTN (hypertension), benign 09/16/2013   IDA (iron deficiency anemia) 09/16/2013   Asthma 09/16/2013   Unspecified vitamin D deficiency 09/16/2013   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    PCP: Agapito Games, MD  REFERRING PROVIDER: Agapito Games, MD  REFERRING DIAG:  M79.18 (ICD-10-CM) - Right buttock pain  M17.0 (ICD-10-CM) - Primary osteoarthritis of both knees    THERAPY DIAG:  Chronic pain of right knee  Stiffness of right knee, not elsewhere classified  Chronic pain of left knee  Stiffness of left knee, not elsewhere classified  Pain in right hip  Rationale for Evaluation and Treatment: Rehabilitation  ONSET DATE: 02/09/2023  SUBJECTIVE:   SUBJECTIVE STATEMENT: Pt enjoyed the aquatic therapy this week, feels like it really helped. Want to continue PT until the end June.  PERTINENT HISTORY:  PAIN:  Are you having pain? 3/10  bil knees  PRECAUTIONS: ICD/Pacemaker  WEIGHT BEARING RESTRICTIONS: No  FALLS:  Has patient fallen in last 6 months? No  LIVING ENVIRONMENT: Lives with: lives with their spouse Lives in: House/apartment Stairs: no STE, 7 steps up, 7 steps down split level home  Has following equipment at home: Single point cane  OCCUPATION: nurse case manager   PLOF: Independent, Independent with basic ADLs, Independent with gait, and  Independent with transfers  PATIENT GOALS: know what I can do to prevent or reduce pain flares   NEXT MD VISIT: Dr. Linford Arnold May 8th   OBJECTIVE:   DIAGNOSTIC FINDINGS:   PATIENT SURVEYS:  LEFS 13/80  COGNITION: Overall cognitive status: Within functional limits for tasks assessed     SENSATION: Not tested no complaints   MUSCLE LENGTH:  HS WNL B  Piriformis L WNL, R moderate limitation   LOWER EXTREMITY ROM:  Active ROM Right eval Left eval Right 03/11/23 Left 03/11/23 Right 03/16/23 Left 03/16/23 Right/Left AROM  Hip flexion         Knee flexion 60* 75* 80 84 79 75 77/83  Knee extension 0* 0*        (Blank rows = not tested)  LOWER EXTREMITY MMT:  MMT Right eval Left eval Right 03/26/23 Left 03/26/23 Right 04/09/23 Left 04/09/23  Hip flexion 3- 3- 4 4 4+ 4  Hip extension     4+ 4  Hip abduction 3 4 4  4+ 4 4+  Hip adduction        Hip internal rotation        Hip external rotation        Knee flexion 4 4 4+ 4+    Knee extension 4+ 4+      Ankle dorsiflexion 4+ 4+       (Blank rows = not tested)  GAIT: Distance walked: in clinic distances  Assistive device utilized: Single point cane Level of assistance: Modified independence Comments: antalgic gait pattern with wide BOS, limited stance times and step lengths   TODAY'S TREATMENT:                                                                                                                              DATE: 04/09/23 Therapeutic Activity: to improve assess performance. Assessed LTGs also spoke with patient about progress and outlook on PT Therapeutic Exercise: to improve strength and mobility.  Demo, verbal and tactile cues throughout for technique.  Nustep L5x68min Sit to stand x 10  Deadlift with slight bend in knees x 10 counter support Standing knee flexion stretch on step x 10 bil counter support   04/06/23  Pt seen for aquatic therapy today.  Treatment took place in water 3.5-4.75 ft in depth at  the [facility] pool. Temp of water was 91.  Pt entered/exited the pool via stair using step to pattern with hand rail.  *intro to setting *UE support barbell: forward walking x 4 widths, back and side stepping x 2.  Slight unsteadiness with backward *standing ue support wall 3.6 ft: toe raises, heel raises, high knee marching; hip extension, hip add/abd; relaxed squats  - wall and HB support: hip circles cw&ccw R/L 2 set x 10-12 *Gait training ues support yell HB: forward (cues heel toe); backward (toe heel increased step length) *hip hiking bottom step R/L x 10   Pt requires the buoyancy and hydrostatic pressure of water for support, and to offload joints by unweighting joint load by at least 50 % in navel deep water and by at least 75-80% in chest to neck deep water.  Viscosity of the water is needed for resistance of strengthening. Water current perturbations provides challenge to standing balance requiring increased core activation.     04/02/23: Nustep L5, 6 min Side steps green TB 4x10 ft Knee extension 10# x 15 BLE; 5# x 15 R/L each Knee flexion 20# x 15 BLE; 10# x 15 R/L each Standing march 2# 2x10 2HHA Standing HS curls 2# 2x10 2HHA Standing for B toe raises and B plantar flexor stretch with forefeet on 1" block, 2 x 15 Seated for L lateral distal quads cross friction massage, myofascial release  PATIENT EDUCATION:  Education details: DN education and aftercare  Person educated: Patient Education method: Explanation, Demonstration, and Handouts Education comprehension: verbalized understanding, returned demonstration, and needs further education  HOME EXERCISE PROGRAM: Access Code: [email or web address] URL: [email or web address] Date: 03/09/2023 Prepared by: Raynelle Fanning  Exercises - Sitting Knee Extension with Resistance  - 1 x daily - 7 x weekly - 3 sets - 10 reps - Seated March with Resistance  - 1 x daily - 7 x weekly - 3 sets - 10 reps - Seated Hip Abduction  with Resistance  - 1 x daily - 7 x weekly - 3 sets - 10 reps - Sit to Stand  - 3 x daily - 7 x weekly - 1-2 sets - 10 reps - Standing Hip Abduction with Resistance at Ankles and Counter Support  - 1 x daily - 7 x weekly - 3 sets - 10 reps - Standing Hip Extension with Resistance at Ankles and Counter Support  - 1 x daily - 7 x weekly - 3 sets - 10 reps - Standing Knee Flexion Stretch on Step  - 1 x daily - 7 x weekly - 2 sets - 10 reps  ASSESSMENT:  CLINICAL IMPRESSION: Demonstrating more weakness in L hip flexors and extensors. Most days she has 5-6/10 pain in both knees but after taking tylenol it reduces to 3/10. She has been able to complete progressed HEP as well as gym based exercises, with modifications to avoid increased knee pain LTG #3 is met. She continues to have a lot of pain with CKC knee flexion which limits squatting down as well as walking. She has had her first visit with aquatic therapy, which went well.   She is interested in continuing PT in order to continue aquatic therapy as well as land therapy.    OBJECTIVE IMPAIRMENTS: Abnormal gait, difficulty walking, decreased ROM, decreased strength, increased fascial restrictions, impaired flexibility, obesity, and pain.   ACTIVITY LIMITATIONS: standing, squatting, stairs, transfers, and locomotion level  PARTICIPATION LIMITATIONS: driving, shopping, community activity, occupation, and yard work  PERSONAL FACTORS: Age, Behavior  pattern, Education, Fitness, Past/current experiences, Social background, and Time since onset of injury/illness/exacerbation are also affecting patient's functional outcome.   REHAB POTENTIAL: Fair chronicity of pain, obesity   CLINICAL DECISION MAKING: Stable/uncomplicated  EVALUATION COMPLEXITY: Low   GOALS: Goals reviewed with patient? Yes  SHORT TERM GOALS: Target date: 03/19/2023   Will be compliant with appropriate progressive HEP  Baseline: Goal status: IN PROGRESS only partially  compliant  2.  Knee flexion ROM to improve by 15 degrees B  Baseline:  Goal status: PARTIALLY MET R knee met.  3.  R knee extension ROM to be no more than 5 degrees  Baseline:  Goal status: MET  4.  Piriformis mm spasm and flexibility impairment to be resolved  Baseline:  Goal status: MET   LONG TERM GOALS: Target date: 04/09/2023 extended to 04/30/23    MMT to improve by 1 grade in all weak groups  Baseline:  Goal status: IN PROGRESS- improved 04/09/23 check MMT chart  2.  Pain to be no more than 2/10 at worst in B knees and R piriformis/glute  Baseline:  Goal status: IN PROGRESS - 04/09/23 5-6/10 most days  3.  Will tolerate advanced HEP vs gym level programming to maintain functional status and prevent recurrence of severe pain  Baseline:  Goal status: MET- 04/09/23  4.  LEFS score to improve by at least 15 points to show improvement in functional status  Baseline:  13/80 Goal status: MET-  03/31/23; 34 / 80 = 42.5 %    PLAN:  PT FREQUENCY: 2x/week  PT DURATION: 6 weeks  PLANNED INTERVENTIONS: Therapeutic exercises, Therapeutic activity, Neuromuscular re-education, Balance training, Gait training, Patient/Family education, Self Care, Joint mobilization, Stair training, DME instructions, Aquatic Therapy, Dry Needling, Electrical stimulation, Spinal mobilization, Cryotherapy, Moist heat, Taping, Ultrasound, Ionotophoresis 4mg /ml Dexamethasone, Manual therapy, and Re-evaluation  PLAN FOR NEXT SESSION: split between land and water therapies; knee ROM, general strength and gait training, DN to hip  Darleene Cleaver, PTA  04/09/2023 9:21 AM   Jena Gauss, PT  04/09/2023 9:38 AM

## 2023-04-09 NOTE — Addendum Note (Signed)
Addended by: Jena Gauss on: 04/09/2023 09:40 AM   Modules accepted: Orders

## 2023-04-14 ENCOUNTER — Ambulatory Visit (HOSPITAL_BASED_OUTPATIENT_CLINIC_OR_DEPARTMENT_OTHER): Payer: Medicare Other | Admitting: Physical Therapy

## 2023-04-14 DIAGNOSIS — M25551 Pain in right hip: Secondary | ICD-10-CM

## 2023-04-14 DIAGNOSIS — M25661 Stiffness of right knee, not elsewhere classified: Secondary | ICD-10-CM

## 2023-04-14 DIAGNOSIS — M25561 Pain in right knee: Secondary | ICD-10-CM | POA: Diagnosis not present

## 2023-04-14 DIAGNOSIS — M25662 Stiffness of left knee, not elsewhere classified: Secondary | ICD-10-CM

## 2023-04-14 DIAGNOSIS — G8929 Other chronic pain: Secondary | ICD-10-CM

## 2023-04-14 NOTE — Therapy (Signed)
OUTPATIENT PHYSICAL THERAPY TREATMENT  Patient Name: [name] MRN: [id number] DOB:March 01, 1956, 67 y.o., female Today's Date: 04/14/2023  END OF SESSION:  PT End of Session - 04/14/23 0832     Visit Number 14    Number of Visits 19    Date for PT Re-Evaluation 04/30/23    Authorization Type MCR/T[employer]    PT Start Time [phone]   pt arrived late to pool   PT Stop Time 0900    PT Time Calculation (min) 36 min    Activity Tolerance Patient tolerated treatment well    Behavior During Therapy [facility] for tasks assessed/performed                   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   Blood transfusion without reported diagnosis    1980's   Colon cancer (HCC) 2009   colon   GERD (gastroesophageal reflux disease)    Hiatal hernia    Hypertension    Past Surgical History:  Procedure Laterality Date   CESAREAN SECTION     1982, 1988, 1989   CHOLECYSTECTOMY  1980   COLONOSCOPY     COLONOSCOPY WITH PROPOFOL N/A 10/02/2015   Procedure: COLONOSCOPY WITH PROPOFOL;  Surgeon: Meryl Dare, MD;  Location: [facility];  Service: Endoscopy;  Laterality: N/A;   laparoscopic sleeve gastrectomy N/A    Surgeon- Greig Right Teppara   PARTIAL COLECTOMY  2009   POLYPECTOMY     TUBAL LIGATION  1989   Patient Active Problem List   Diagnosis Date Noted   Hyperlipidemia 02/11/2023   Encounter for weight management 12/18/2022   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   Sebaceous cyst 11/02/2019   OSA (obstructive sleep apnea) 03/22/2018   Trochanteric bursitis of right hip 05/14/2017   Morbid obesity (HCC) 08/01/2016   IFG (impaired fasting glucose) 07/14/2016   Primary osteoarthritis of both hands 08/03/2015   Elevated LDL cholesterol level 09/16/2013   Trigger thumb of left hand 09/16/2013   Knee osteoarthritis 09/16/2013   HTN (hypertension), benign 09/16/2013   IDA (iron  deficiency anemia) 09/16/2013   Asthma 09/16/2013   Unspecified vitamin D deficiency 09/16/2013   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    PCP: Agapito Games, MD  REFERRING PROVIDER: Agapito Games, MD  REFERRING DIAG:  M79.18 (ICD-10-CM) - Right buttock pain  M17.0 (ICD-10-CM) - Primary osteoarthritis of both knees    THERAPY DIAG:  Chronic pain of right knee  Stiffness of right knee, not elsewhere classified  Chronic pain of left knee  Stiffness of left knee, not elsewhere classified  Pain in right hip  Rationale for Evaluation and Treatment: Rehabilitation  ONSET DATE: 02/09/2023  SUBJECTIVE:   SUBJECTIVE STATEMENT: Pt reports that she was sore after aquatic session, "but not too bad to where I couldn't go shopping".   Pt reports she took some pain medicine in the middle of night, since she knew she was coming to aquatic therapy in morning.   PERTINENT HISTORY:  PAIN:  Are you having pain? 3-4/10  bil knees Description:  tight, achy  PRECAUTIONS: ICD/Pacemaker  WEIGHT BEARING RESTRICTIONS: No  FALLS:  Has patient fallen in last 6 months? No  LIVING ENVIRONMENT: Lives with: lives with their spouse Lives in: House/apartment Stairs: no STE, 7 steps up, 7 steps down split  level home  Has following equipment at home: Single point cane  OCCUPATION: nurse case manager   PLOF: Independent, Independent with basic ADLs, Independent with gait, and Independent with transfers  PATIENT GOALS: know what I can do to prevent or reduce pain flares   NEXT MD VISIT:  OBJECTIVE:   DIAGNOSTIC FINDINGS:   PATIENT SURVEYS:  LEFS 13/80  COGNITION: Overall cognitive status: Within functional limits for tasks assessed     SENSATION: Not tested no complaints   MUSCLE LENGTH:  HS WNL B  Piriformis L WNL, R moderate limitation   LOWER EXTREMITY ROM:  Active ROM Right eval Left eval Right 03/11/23 Left 03/11/23 Right 03/16/23  Left 03/16/23 Right/Left AROM  Hip flexion         Knee flexion 60* 75* 80 84 79 75 77/83  Knee extension 0* 0*        (Blank rows = not tested)  LOWER EXTREMITY MMT:  MMT Right eval Left eval Right 03/26/23 Left 03/26/23 Right 04/09/23 Left 04/09/23  Hip flexion 3- 3- 4 4 4+ 4  Hip extension     4+ 4  Hip abduction 3 4 4  4+ 4 4+  Hip adduction        Hip internal rotation        Hip external rotation        Knee flexion 4 4 4+ 4+    Knee extension 4+ 4+      Ankle dorsiflexion 4+ 4+       (Blank rows = not tested)  GAIT: Distance walked: in clinic distances  Assistive device utilized: Single point cane Level of assistance: Modified independence Comments: antalgic gait pattern with wide BOS, limited stance times and step lengths   TODAY'S TREATMENT:                                                                                                                              DATE: 04/14/23  Pt seen for aquatic therapy today.  Treatment took place in water 3.5-4.75 ft in depth at the [facility] pool. Temp of water was 91.  Pt entered/exited the pool via stair using step to pattern with hand rail.  *without/ with UE support on yellow hand floats: forward/ backward walking x 2 laps ( Cues for heel/toe and toe/heel, and for even step length with backward walking), side stepping x 2 laps, marching 2 laps  *UE on yellow hand floats:hip abdct/ addct 3 x 5; Leg swings into hip flex/ext 3 x 5  * forward step/ and return to neutral x 10 reps for work on direction change and balance without locking knees *STS at bench in water, with feet on blue step, no UE support - cues for hip hinge and controlled descent x 10 * relaxed squats holding wall x 10 - cues to allow heels to come up, and vertical trunk  04/09/23 Therapeutic Activity: to improve assess performance. Assessed LTGs also spoke  with patient about progress and outlook on PT Therapeutic Exercise: to improve strength and  mobility.  Demo, verbal and tactile cues throughout for technique.  Nustep L5x22min Sit to stand x 10  Deadlift with slight bend in knees x 10 counter support Standing knee flexion stretch on step x 10 bil counter support   04/06/23  Pt seen for aquatic therapy today.  Treatment took place in water 3.5-4.75 ft in depth at the [facility] pool. Temp of water was 91.  Pt entered/exited the pool via stair using step to pattern with hand rail.  *intro to setting *UE support barbell: forward walking x 4 widths, back and side stepping x 2.  Slight unsteadiness with backward *standing ue support wall 3.6 ft: toe raises, heel raises, high knee marching; hip extension, hip add/abd; relaxed squats  - wall and HB support: hip circles cw&ccw R/L 2 set x 10-12 *Gait training ues support yell HB: forward (cues heel toe); backward (toe heel increased step length) *hip hiking bottom step R/L x 10   Pt requires the buoyancy and hydrostatic pressure of water for support, and to offload joints by unweighting joint load by at least 50 % in navel deep water and by at least 75-80% in chest to neck deep water.  Viscosity of the water is needed for resistance of strengthening. Water current perturbations provides challenge to standing balance requiring increased core activation.     04/02/23: Nustep L5, 6 min Side steps green TB 4x10 ft Knee extension 10# x 15 BLE; 5# x 15 R/L each Knee flexion 20# x 15 BLE; 10# x 15 R/L each Standing march 2# 2x10 2HHA Standing HS curls 2# 2x10 2HHA Standing for B toe raises and B plantar flexor stretch with forefeet on 1" block, 2 x 15 Seated for L lateral distal quads cross friction massage, myofascial release  PATIENT EDUCATION:  Education details: DN education and aftercare  Person educated: Patient Education method: Explanation, Demonstration, and Handouts Education comprehension: verbalized understanding, returned demonstration, and needs further  education  HOME EXERCISE PROGRAM: Access Code: [email or web address] URL: [email or web address] Date: 03/09/2023 Prepared by: Raynelle Fanning  Exercises - Sitting Knee Extension with Resistance  - 1 x daily - 7 x weekly - 3 sets - 10 reps - Seated March with Resistance  - 1 x daily - 7 x weekly - 3 sets - 10 reps - Seated Hip Abduction with Resistance  - 1 x daily - 7 x weekly - 3 sets - 10 reps - Sit to Stand  - 3 x daily - 7 x weekly - 1-2 sets - 10 reps - Standing Hip Abduction with Resistance at Ankles and Counter Support  - 1 x daily - 7 x weekly - 3 sets - 10 reps - Standing Hip Extension with Resistance at Ankles and Counter Support  - 1 x daily - 7 x weekly - 3 sets - 10 reps - Standing Knee Flexion Stretch on Step  - 1 x daily - 7 x weekly - 2 sets - 10 reps  AQUATIC Access Code: [email or web address] URL: [email or web address] Date: 04/14/2023 Prepared by: [facility] * not issued yet.   ASSESSMENT:  CLINICAL IMPRESSION: Pt requires cues for posture (less lateral lean when progressing legs in gait), and to allow knees to flex during swing through.  Pt reported no increase in pain while in the water.  Will plan to create aquatic HEP over next 2 visits and issue, so that pt may continue  exercises independently in community.  She would benefit from some SLS/balance exercises in water next visit. Progressing well towards remaining goals.    OBJECTIVE IMPAIRMENTS: Abnormal gait, difficulty walking, decreased ROM, decreased strength, increased fascial restrictions, impaired flexibility, obesity, and pain.   ACTIVITY LIMITATIONS: standing, squatting, stairs, transfers, and locomotion level  PARTICIPATION LIMITATIONS: driving, shopping, community activity, occupation, and yard work  PERSONAL FACTORS: Age, Behavior pattern, Education, Fitness, Past/current experiences, Social background, and Time since onset of injury/illness/exacerbation are also affecting  patient's functional outcome.   REHAB POTENTIAL: Fair chronicity of pain, obesity   CLINICAL DECISION MAKING: Stable/uncomplicated  EVALUATION COMPLEXITY: Low   GOALS: Goals reviewed with patient? Yes  SHORT TERM GOALS: Target date: 03/19/2023   Will be compliant with appropriate progressive HEP  Baseline: Goal status: IN PROGRESS only partially compliant  2.  Knee flexion ROM to improve by 15 degrees B  Baseline:  Goal status: PARTIALLY MET R knee met.  3.  R knee extension ROM to be no more than 5 degrees  Baseline:  Goal status: MET  4.  Piriformis mm spasm and flexibility impairment to be resolved  Baseline:  Goal status: MET   LONG TERM GOALS: Target date: 04/09/2023 extended to 04/30/23    MMT to improve by 1 grade in all weak groups  Baseline:  Goal status: IN PROGRESS- improved 04/09/23 check MMT chart  2.  Pain to be no more than 2/10 at worst in B knees and R piriformis/glute  Baseline:  Goal status: IN PROGRESS - 04/09/23 5-6/10 most days  3.  Will tolerate advanced HEP vs gym level programming to maintain functional status and prevent recurrence of severe pain  Baseline:  Goal status: MET- 04/09/23  4.  LEFS score to improve by at least 15 points to show improvement in functional status  Baseline:  13/80 Goal status: MET-  03/31/23; 34 / 80 = 42.5 %    PLAN:  PT FREQUENCY: 2x/week  PT DURATION: 6 weeks  PLANNED INTERVENTIONS: Therapeutic exercises, Therapeutic activity, Neuromuscular re-education, Balance training, Gait training, Patient/Family education, Self Care, Joint mobilization, Stair training, DME instructions, Aquatic Therapy, Dry Needling, Electrical stimulation, Spinal mobilization, Cryotherapy, Moist heat, Taping, Ultrasound, Ionotophoresis 4mg /ml Dexamethasone, Manual therapy, and Re-evaluation  PLAN FOR NEXT SESSION: split between land and water therapies; knee ROM, general strength and gait training, DN to hip  Mayer Camel,  PTA 04/14/23 10:13 AM [facility] [address] [place], [place], [address] Phone: [phone]   Fax:  [phone]

## 2023-04-16 ENCOUNTER — Ambulatory Visit: Payer: Medicare Other

## 2023-04-16 DIAGNOSIS — M25551 Pain in right hip: Secondary | ICD-10-CM

## 2023-04-16 DIAGNOSIS — M25561 Pain in right knee: Secondary | ICD-10-CM | POA: Diagnosis not present

## 2023-04-16 DIAGNOSIS — G8929 Other chronic pain: Secondary | ICD-10-CM

## 2023-04-16 DIAGNOSIS — M25661 Stiffness of right knee, not elsewhere classified: Secondary | ICD-10-CM

## 2023-04-16 DIAGNOSIS — M25662 Stiffness of left knee, not elsewhere classified: Secondary | ICD-10-CM

## 2023-04-16 NOTE — Therapy (Signed)
OUTPATIENT PHYSICAL THERAPY TREATMENT       Patient Name: [name] MRN: [id number] DOB:1956-10-16, 67 y.o., female Today's Date: 04/16/2023  END OF SESSION:  PT End of Session - 04/16/23 0924     Visit Number 15    Number of Visits 19    Date for PT Re-Evaluation 04/30/23    Authorization Type MCR/Tricare    PT Start Time 0855   pt late   PT Stop Time 0930    PT Time Calculation (min) 35 min    Activity Tolerance Patient tolerated treatment well    Behavior During Therapy [name] for tasks assessed/performed                    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   Blood transfusion without reported diagnosis    1980's   Colon cancer (HCC) 2009   colon   GERD (gastroesophageal reflux disease)    Hiatal hernia    Hypertension    Past Surgical History:  Procedure Laterality Date   CESAREAN SECTION     1982, 1988, 1989   CHOLECYSTECTOMY  1980   COLONOSCOPY     COLONOSCOPY WITH PROPOFOL N/A 10/02/2015   Procedure: COLONOSCOPY WITH PROPOFOL;  Surgeon: Christina Dare, MD;  Location: [facility];  Service: Endoscopy;  Laterality: N/A;   laparoscopic sleeve gastrectomy N/A    Surgeon- [name]   PARTIAL COLECTOMY  2009   POLYPECTOMY     TUBAL LIGATION  1989   Patient Active Problem List   Diagnosis Date Noted   Hyperlipidemia 02/11/2023   Encounter for weight management 12/18/2022   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   Sebaceous cyst 11/02/2019   OSA (obstructive sleep apnea) 03/22/2018   Trochanteric bursitis of right hip 05/14/2017   Morbid obesity (HCC) 08/01/2016   IFG (impaired fasting glucose) 07/14/2016   Primary osteoarthritis of both hands 08/03/2015   Elevated LDL cholesterol level 09/16/2013   Trigger thumb of left hand 09/16/2013   Knee osteoarthritis 09/16/2013   HTN (hypertension), benign 09/16/2013   IDA (iron  deficiency anemia) 09/16/2013   Asthma 09/16/2013   Unspecified vitamin D deficiency 09/16/2013   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    PCP: Christina Games, MD  REFERRING PROVIDER: Agapito Games, MD  REFERRING DIAG:  M79.18 (ICD-10-CM) - Right buttock pain  M17.0 (ICD-10-CM) - Primary osteoarthritis of both knees    THERAPY DIAG:  Chronic pain of right knee  Stiffness of right knee, not elsewhere classified  Chronic pain of left knee  Stiffness of left knee, not elsewhere classified  Pain in right hip  Rationale for Evaluation and Treatment: Rehabilitation  ONSET DATE: 02/09/2023  SUBJECTIVE:   SUBJECTIVE STATEMENT: Pt notes improved mobility. She enjoys aquatic therapy but it works you hard.  PERTINENT HISTORY:  PAIN:  Are you having pain? 3-4/10  bil knees  PRECAUTIONS: ICD/Pacemaker  WEIGHT BEARING RESTRICTIONS: No  FALLS:  Has patient fallen in last 6 months? No  LIVING ENVIRONMENT: Lives with: lives with their spouse Lives in: House/apartment Stairs: no STE, 7 steps up, 7 steps down split level home  Has following equipment at home: Single point cane  OCCUPATION: nurse case manager   PLOF: Independent, Independent with basic ADLs, Independent with gait, and Independent with transfers  PATIENT  GOALS: know what I can do to prevent or reduce pain flares   NEXT MD VISIT: [name] May 8th   OBJECTIVE:   DIAGNOSTIC FINDINGS:   PATIENT SURVEYS:  LEFS 13/80  COGNITION: Overall cognitive status: Within functional limits for tasks assessed     SENSATION: Not tested no complaints   MUSCLE LENGTH:  HS WNL B  Piriformis L WNL, R moderate limitation   LOWER EXTREMITY ROM:  Active ROM Right eval Left eval Right 03/11/23 Left 03/11/23 Right 03/16/23 Left 03/16/23 Right/Left AROM  Hip flexion         Knee flexion 60* 75* 80 84 79 75 77/83  Knee extension 0* 0*        (Blank rows = not tested)  LOWER  EXTREMITY MMT:  MMT Right eval Left eval Right 03/26/23 Left 03/26/23 Right 04/09/23 Left 04/09/23  Hip flexion 3- 3- 4 4 4+ 4  Hip extension     4+ 4  Hip abduction 3 4 4  4+ 4 4+  Hip adduction        Hip internal rotation        Hip external rotation        Knee flexion 4 4 4+ 4+    Knee extension 4+ 4+      Ankle dorsiflexion 4+ 4+       (Blank rows = not tested)  GAIT: Distance walked: in clinic distances  Assistive device utilized: Single point cane Level of assistance: Modified independence Comments: antalgic gait pattern with wide BOS, limited stance times and step lengths   TODAY'S TREATMENT:                                                                                                                              DATE: 04/16/23 Therapeutic Exercise: to improve strength and mobility.  Demo, verbal and tactile cues throughout for technique.  Nustep L6x70min Standing lunge stretch x 20 R/L Step ups on 2' book x 10 R/L SLS 2x for 10 sec holds Lateral toe taps to 2' book x 10 bil Leg press 15# x 25  Knee extension 15# 2x10 Knee flexion 15# 2x10  04/09/23 Therapeutic Activity: to improve assess performance. Assessed LTGs also spoke with patient about progress and outlook on PT Therapeutic Exercise: to improve strength and mobility.  Demo, verbal and tactile cues throughout for technique.  Nustep L5x72min Sit to stand x 10  Deadlift with slight bend in knees x 10 counter support Standing knee flexion stretch on step x 10 bil counter support   04/06/23  Pt seen for aquatic therapy today.  Treatment took place in water 3.5-4.75 ft in depth at the [facility] pool. Temp of water was 91.  Pt entered/exited the pool via stair using step to pattern with hand rail.  *intro to setting *UE support barbell: forward walking x 4 widths, back and side stepping x 2.  Slight unsteadiness with backward *standing ue support wall  3.6 ft: toe raises, heel raises, high knee  marching; hip extension, hip add/abd; relaxed squats  - wall and HB support: hip circles cw&ccw R/L 2 set x 10-12 *Gait training ues support yell HB: forward (cues heel toe); backward (toe heel increased step length) *hip hiking bottom step R/L x 10   Pt requires the buoyancy and hydrostatic pressure of water for support, and to offload joints by unweighting joint load by at least 50 % in navel deep water and by at least 75-80% in chest to neck deep water.  Viscosity of the water is needed for resistance of strengthening. Water current perturbations provides challenge to standing balance requiring increased core activation.     04/02/23: Nustep L5, 6 min Side steps green TB 4x10 ft Knee extension 10# x 15 BLE; 5# x 15 R/L each Knee flexion 20# x 15 BLE; 10# x 15 R/L each Standing march 2# 2x10 2HHA Standing HS curls 2# 2x10 2HHA Standing for B toe raises and B plantar flexor stretch with forefeet on 1" block, 2 x 15 Seated for L lateral distal quads cross friction massage, myofascial release  PATIENT EDUCATION:  Education details: DN education and aftercare  Person educated: Patient Education method: Explanation, Demonstration, and Handouts Education comprehension: verbalized understanding, returned demonstration, and needs further education  HOME EXERCISE PROGRAM: Access Code: [email or web address] URL: [email or web address] Date: 03/09/2023 Prepared by: Raynelle Fanning  Exercises - Sitting Knee Extension with Resistance  - 1 x daily - 7 x weekly - 3 sets - 10 reps - Seated March with Resistance  - 1 x daily - 7 x weekly - 3 sets - 10 reps - Seated Hip Abduction with Resistance  - 1 x daily - 7 x weekly - 3 sets - 10 reps - Sit to Stand  - 3 x daily - 7 x weekly - 1-2 sets - 10 reps - Standing Hip Abduction with Resistance at Ankles and Counter Support  - 1 x daily - 7 x weekly - 3 sets - 10 reps - Standing Hip Extension with Resistance at Ankles and Counter Support  - 1 x daily - 7 x  weekly - 3 sets - 10 reps - Standing Knee Flexion Stretch on Step  - 1 x daily - 7 x weekly - 2 sets - 10 reps  ASSESSMENT:  CLINICAL IMPRESSION: Progressed knee strengthening to improve function. Pt continues to have difficulty with closed chain knee flexion doing step ups. She also shows difficulty with WB on one leg alone with toe taps. Pt denied needing any updates for HEP. She is noting improved mobility. She arrived 10 min late to session   OBJECTIVE IMPAIRMENTS: Abnormal gait, difficulty walking, decreased ROM, decreased strength, increased fascial restrictions, impaired flexibility, obesity, and pain.   ACTIVITY LIMITATIONS: standing, squatting, stairs, transfers, and locomotion level  PARTICIPATION LIMITATIONS: driving, shopping, community activity, occupation, and yard work  PERSONAL FACTORS: Age, Behavior pattern, Education, Fitness, Past/current experiences, Social background, and Time since onset of injury/illness/exacerbation are also affecting patient's functional outcome.   REHAB POTENTIAL: Fair chronicity of pain, obesity   CLINICAL DECISION MAKING: Stable/uncomplicated  EVALUATION COMPLEXITY: Low   GOALS: Goals reviewed with patient? Yes  SHORT TERM GOALS: Target date: 03/19/2023   Will be compliant with appropriate progressive HEP  Baseline: Goal status: IN PROGRESS only partially compliant  2.  Knee flexion ROM to improve by 15 degrees B  Baseline:  Goal status: PARTIALLY MET R knee met.  3.  R knee extension ROM to  be no more than 5 degrees  Baseline:  Goal status: MET  4.  Piriformis mm spasm and flexibility impairment to be resolved  Baseline:  Goal status: MET  LONG TERM GOALS: Target date: 04/09/2023 extended to 04/30/23    MMT to improve by 1 grade in all weak groups  Baseline:  Goal status: IN PROGRESS- improved 04/09/23 check MMT chart  2.  Pain to be no more than 2/10 at worst in B knees and R piriformis/glute  Baseline:  Goal status: IN  PROGRESS - 04/09/23 5-6/10 most days  3.  Will tolerate advanced HEP vs gym level programming to maintain functional status and prevent recurrence of severe pain  Baseline:  Goal status: MET- 04/09/23  4.  LEFS score to improve by at least 15 points to show improvement in functional status  Baseline:  13/80 Goal status: MET-  03/31/23; 34 / 80 = 42.5 %    PLAN:  PT FREQUENCY: 2x/week  PT DURATION: 6 weeks  PLANNED INTERVENTIONS: Therapeutic exercises, Therapeutic activity, Neuromuscular re-education, Balance training, Gait training, Patient/Family education, Self Care, Joint mobilization, Stair training, DME instructions, Aquatic Therapy, Dry Needling, Electrical stimulation, Spinal mobilization, Cryotherapy, Moist heat, Taping, Ultrasound, Ionotophoresis 4mg /ml Dexamethasone, Manual therapy, and Re-evaluation  PLAN FOR NEXT SESSION: split between land and water therapies; progress knee strengthening  Darleene Cleaver, PTA  04/16/2023 9:31 AM

## 2023-04-20 ENCOUNTER — Ambulatory Visit (HOSPITAL_BASED_OUTPATIENT_CLINIC_OR_DEPARTMENT_OTHER): Payer: Medicare Other | Admitting: Physical Therapy

## 2023-04-20 ENCOUNTER — Encounter (HOSPITAL_BASED_OUTPATIENT_CLINIC_OR_DEPARTMENT_OTHER): Payer: Self-pay | Admitting: Physical Therapy

## 2023-04-20 DIAGNOSIS — M25662 Stiffness of left knee, not elsewhere classified: Secondary | ICD-10-CM

## 2023-04-20 DIAGNOSIS — M25661 Stiffness of right knee, not elsewhere classified: Secondary | ICD-10-CM

## 2023-04-20 DIAGNOSIS — M25561 Pain in right knee: Secondary | ICD-10-CM | POA: Diagnosis not present

## 2023-04-20 DIAGNOSIS — G8929 Other chronic pain: Secondary | ICD-10-CM

## 2023-04-20 NOTE — Therapy (Signed)
OUTPATIENT PHYSICAL THERAPY TREATMENT       Patient Name: UTA BLYDENBURGH MRN: [id number] DOB:[birth year], 67 y.o., female Today's Date: 04/20/2023  END OF SESSION:  PT End of Session - 04/20/23 0826     Visit Number 16    Number of Visits 19    Date for PT Re-Evaluation 04/30/23    Authorization Type MCR/Tricare    PT Start Time 0827    PT Stop Time 0900    PT Time Calculation (min) 33 min    Activity Tolerance Patient tolerated treatment well    Behavior During Therapy [facility] for tasks assessed/performed                    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   Blood transfusion without reported diagnosis    1980's   Colon cancer (HCC) 2009   colon   GERD (gastroesophageal reflux disease)    Hiatal hernia    Hypertension    Past Surgical History:  Procedure Laterality Date   CESAREAN SECTION     1982, 1988, 1989   CHOLECYSTECTOMY  1980   COLONOSCOPY     COLONOSCOPY WITH PROPOFOL N/A 10/02/2015   Procedure: COLONOSCOPY WITH PROPOFOL;  Surgeon: Meryl Dare, MD;  Location: [facility];  Service: Endoscopy;  Laterality: N/A;   laparoscopic sleeve gastrectomy N/A    Surgeon- Greig Right Teppara   PARTIAL COLECTOMY  2009   POLYPECTOMY     TUBAL LIGATION  1989   Patient Active Problem List   Diagnosis Date Noted   Hyperlipidemia 02/11/2023   Encounter for weight management 12/18/2022   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   Sebaceous cyst 11/02/2019   OSA (obstructive sleep apnea) 03/22/2018   Trochanteric bursitis of right hip 05/14/2017   Morbid obesity (HCC) 08/01/2016   IFG (impaired fasting glucose) 07/14/2016   Primary osteoarthritis of both hands 08/03/2015   Elevated LDL cholesterol level 09/16/2013   Trigger thumb of left hand 09/16/2013   Knee osteoarthritis 09/16/2013   HTN (hypertension), benign 09/16/2013   IDA (iron deficiency  anemia) 09/16/2013   Asthma 09/16/2013   Unspecified vitamin D deficiency 09/16/2013   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    PCP: Agapito Games, MD  REFERRING PROVIDER: Agapito Games, MD  REFERRING DIAG:  M79.18 (ICD-10-CM) - Right buttock pain  M17.0 (ICD-10-CM) - Primary osteoarthritis of both knees    THERAPY DIAG:  Chronic pain of right knee  Stiffness of right knee, not elsewhere classified  Chronic pain of left knee  Stiffness of left knee, not elsewhere classified  Rationale for Evaluation and Treatment: Rehabilitation  ONSET DATE: 02/09/2023  SUBJECTIVE:   SUBJECTIVE STATEMENT: Pt notes improved mobility. She enjoys aquatic therapy but it works you hard.  PERTINENT HISTORY:  PAIN:  Are you having pain? 3/10  bil knees  PRECAUTIONS: ICD/Pacemaker  WEIGHT BEARING RESTRICTIONS: No  FALLS:  Has patient fallen in last 6 months? No  LIVING ENVIRONMENT: Lives with: lives with their spouse Lives in: House/apartment Stairs: no STE, 7 steps up, 7 steps down split level home  Has following equipment at home: Single point cane  OCCUPATION: nurse case manager   PLOF: Independent, Independent with basic ADLs, Independent with gait, and Independent with transfers  PATIENT GOALS: know what I can do to prevent  or reduce pain flares   NEXT MD VISIT: Dr. Linford Arnold May 8th   OBJECTIVE:   DIAGNOSTIC FINDINGS:   PATIENT SURVEYS:  LEFS 13/80  COGNITION: Overall cognitive status: Within functional limits for tasks assessed     SENSATION: Not tested no complaints   MUSCLE LENGTH:  HS WNL B  Piriformis L WNL, R moderate limitation   LOWER EXTREMITY ROM:  Active ROM Right eval Left eval Right 03/11/23 Left 03/11/23 Right 03/16/23 Left 03/16/23 Right/Left AROM  Hip flexion         Knee flexion 60* 75* 80 84 79 75 77/83  Knee extension 0* 0*        (Blank rows = not tested)  LOWER EXTREMITY MMT:  MMT Right eval  Left eval Right 03/26/23 Left 03/26/23 Right 04/09/23 Left 04/09/23  Hip flexion 3- 3- 4 4 4+ 4  Hip extension     4+ 4  Hip abduction 3 4 4  4+ 4 4+  Hip adduction        Hip internal rotation        Hip external rotation        Knee flexion 4 4 4+ 4+    Knee extension 4+ 4+      Ankle dorsiflexion 4+ 4+       (Blank rows = not tested)  GAIT: Distance walked: in clinic distances  Assistive device utilized: Single point cane Level of assistance: Modified independence Comments: antalgic gait pattern with wide BOS, limited stance times and step lengths   TODAY'S TREATMENT:                                                                                                                              DATE: 04/20/23 Pt seen for aquatic therapy today.  Treatment took place in water 3.5-4.75 ft in depth at the [facility] pool. Temp of water was 91.  Pt entered/exited the pool via stair using step to pattern with hand rail.   *UE support barbell: forward walking x 6 widths, Forward march/kick x 2 widths, back and side stepping x 2.   *standing ue support barbell 3.6 ft: toe raises, heel raises, high knee marching; hip extension, hip flex; hip add/abd; relaxed squats; hip flex/ext and 3 way tap (balance) x 10  - wall and HB support: hip circles cw&ccw R/L 2 set x 10-12 *Gait training ues support yell HB: forward (cues heel toe); backward (toe heel increased step length) *hip hiking bottom step R/L x 10   Pt requires the buoyancy and hydrostatic pressure of water for support, and to offload joints by unweighting joint load by at least 50 % in navel deep water and by at least 75-80% in chest to neck deep water.  Viscosity of the water is needed for resistance of strengthening. Water current perturbations provides challenge to standing balance requiring increased core activation.    04/16/23 Therapeutic Exercise: to improve strength and mobility.  Demo,  verbal and tactile cues throughout  for technique.  Nustep L6x40min Standing lunge stretch x 20 R/L Step ups on 2' book x 10 R/L SLS 2x for 10 sec holds Lateral toe taps to 2' book x 10 bil Leg press 15# x 25  Knee extension 15# 2x10 Knee flexion 15# 2x10  04/09/23 Therapeutic Activity: to improve assess performance. Assessed LTGs also spoke with patient about progress and outlook on PT Therapeutic Exercise: to improve strength and mobility.  Demo, verbal and tactile cues throughout for technique.  Nustep L5x57min Sit to stand x 10  Deadlift with slight bend in knees x 10 counter support Standing knee flexion stretch on step x 10 bil counter support   04/06/23  Pt seen for aquatic therapy today.  Treatment took place in water 3.5-4.75 ft in depth at the [facility] pool. Temp of water was 91.  Pt entered/exited the pool via stair using step to pattern with hand rail.  *intro to setting *UE support barbell: forward walking x 4 widths, back and side stepping x 2.  Slight unsteadiness with backward *standing ue support wall 3.6 ft: toe raises, heel raises, high knee marching; hip extension, hip add/abd; relaxed squats  - wall and HB support: hip circles cw&ccw R/L 2 set x 10-12 *Gait training ues support yellow HB: forward (cues heel toe); backward (toe heel increased step length).     Pt requires the buoyancy and hydrostatic pressure of water for support, and to offload joints by unweighting joint load by at least 50 % in navel deep water and by at least 75-80% in chest to neck deep water.  Viscosity of the water is needed for resistance of strengthening. Water current perturbations provides challenge to standing balance requiring increased core activation.     04/02/23: Nustep L5, 6 min Side steps green TB 4x10 ft Knee extension 10# x 15 BLE; 5# x 15 R/L each Knee flexion 20# x 15 BLE; 10# x 15 R/L each Standing march 2# 2x10 2HHA Standing HS curls 2# 2x10 2HHA Standing for B toe raises and B plantar flexor  stretch with forefeet on 1" block, 2 x 15 Seated for L lateral distal quads cross friction massage, myofascial release  PATIENT EDUCATION:  Education details: DN education and aftercare  Person educated: Patient Education method: Explanation, Demonstration, and Handouts Education comprehension: verbalized understanding, returned demonstration, and needs further education  HOME EXERCISE PROGRAM: Access Code: [email or web address] URL: [email or web address] Date: 03/09/2023 Prepared by: Raynelle Fanning  Exercises - Sitting Knee Extension with Resistance  - 1 x daily - 7 x weekly - 3 sets - 10 reps - Seated March with Resistance  - 1 x daily - 7 x weekly - 3 sets - 10 reps - Seated Hip Abduction with Resistance  - 1 x daily - 7 x weekly - 3 sets - 10 reps - Sit to Stand  - 3 x daily - 7 x weekly - 1-2 sets - 10 reps - Standing Hip Abduction with Resistance at Ankles and Counter Support  - 1 x daily - 7 x weekly - 3 sets - 10 reps - Standing Hip Extension with Resistance at Ankles and Counter Support  - 1 x daily - 7 x weekly - 3 sets - 10 reps - Standing Knee Flexion Stretch on Step  - 1 x daily - 7 x weekly - 2 sets - 10 reps  ASSESSMENT:  CLINICAL IMPRESSION: Pt tolerates decreased ue support with exercises increasing balance challenge and strengthening well.  She requires vc and demonstration for proper execution of stair climbing using le strength rather than pulling up with ue.  She has difficulty with coordination.  Execution improves some with cues for decreased ue support although pt continues to use compensatory hip movements to avoid knee flex.  Progressed knee strengthening to improve function. Pt continues to have difficulty with closed chain knee flexion doing step ups. She also shows difficulty with WB on one leg alone with toe taps. Pt denied needing any updates for HEP. She is noting improved mobility. She arrived 10 min late to session   OBJECTIVE IMPAIRMENTS: Abnormal gait,  difficulty walking, decreased ROM, decreased strength, increased fascial restrictions, impaired flexibility, obesity, and pain.   ACTIVITY LIMITATIONS: standing, squatting, stairs, transfers, and locomotion level  PARTICIPATION LIMITATIONS: driving, shopping, community activity, occupation, and yard work  PERSONAL FACTORS: Age, Behavior pattern, Education, Fitness, Past/current experiences, Social background, and Time since onset of injury/illness/exacerbation are also affecting patient's functional outcome.   REHAB POTENTIAL: Fair chronicity of pain, obesity   CLINICAL DECISION MAKING: Stable/uncomplicated  EVALUATION COMPLEXITY: Low   GOALS: Goals reviewed with patient? Yes  SHORT TERM GOALS: Target date: 03/19/2023   Will be compliant with appropriate progressive HEP  Baseline: Goal status: IN PROGRESS only partially compliant  2.  Knee flexion ROM to improve by 15 degrees B  Baseline:  Goal status: PARTIALLY MET R knee met.  3.  R knee extension ROM to be no more than 5 degrees  Baseline:  Goal status: MET  4.  Piriformis mm spasm and flexibility impairment to be resolved  Baseline:  Goal status: MET  LONG TERM GOALS: Target date: 04/09/2023 extended to 04/30/23    MMT to improve by 1 grade in all weak groups  Baseline:  Goal status: IN PROGRESS- improved 04/09/23 check MMT chart  2.  Pain to be no more than 2/10 at worst in B knees and R piriformis/glute  Baseline:  Goal status: IN PROGRESS - 04/09/23 5-6/10 most days  3.  Will tolerate advanced HEP vs gym level programming to maintain functional status and prevent recurrence of severe pain  Baseline:  Goal status: MET- 04/09/23  4.  LEFS score to improve by at least 15 points to show improvement in functional status  Baseline:  13/80 Goal status: MET-  03/31/23; 34 / 80 = 42.5 %    PLAN:  PT FREQUENCY: 2x/week  PT DURATION: 6 weeks  PLANNED INTERVENTIONS: Therapeutic exercises, Therapeutic activity,  Neuromuscular re-education, Balance training, Gait training, Patient/Family education, Self Care, Joint mobilization, Stair training, DME instructions, Aquatic Therapy, Dry Needling, Electrical stimulation, Spinal mobilization, Cryotherapy, Moist heat, Taping, Ultrasound, Ionotophoresis 4mg /ml Dexamethasone, Manual therapy, and Re-evaluation  PLAN FOR NEXT SESSION: split between land and water therapies; progress knee strengthening  Corrie Dandy Tomma Lightning) Ezekiah Massie MPT 04/20/2023 8:32 AM

## 2023-04-22 ENCOUNTER — Ambulatory Visit: Payer: Medicare Other

## 2023-04-22 DIAGNOSIS — G8929 Other chronic pain: Secondary | ICD-10-CM

## 2023-04-22 DIAGNOSIS — M25561 Pain in right knee: Secondary | ICD-10-CM | POA: Diagnosis not present

## 2023-04-22 DIAGNOSIS — M25661 Stiffness of right knee, not elsewhere classified: Secondary | ICD-10-CM

## 2023-04-22 DIAGNOSIS — M25551 Pain in right hip: Secondary | ICD-10-CM

## 2023-04-22 DIAGNOSIS — M25662 Stiffness of left knee, not elsewhere classified: Secondary | ICD-10-CM

## 2023-04-22 NOTE — Therapy (Signed)
OUTPATIENT PHYSICAL THERAPY TREATMENT       Patient Name: [name] MRN: [id number] DOB:1956/06/25, 67 y.o., female Today's Date: 04/22/2023  END OF SESSION:  PT End of Session - 04/22/23 1453     Visit Number 17    Number of Visits 19    Date for PT Re-Evaluation 04/30/23    Authorization Type MCR/Tricare    PT Start Time 1448    PT Stop Time 1530    PT Time Calculation (min) 42 min    Activity Tolerance Patient tolerated treatment well    Behavior During Therapy [facility] for tasks assessed/performed                     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   Blood transfusion without reported diagnosis    1980's   Colon cancer (HCC) 2009   colon   GERD (gastroesophageal reflux disease)    Hiatal hernia    Hypertension    Past Surgical History:  Procedure Laterality Date   CESAREAN SECTION     1982, 1988, 1989   CHOLECYSTECTOMY  1980   COLONOSCOPY     COLONOSCOPY WITH PROPOFOL N/A 10/02/2015   Procedure: COLONOSCOPY WITH PROPOFOL;  Surgeon: Meryl Dare, MD;  Location: [facility];  Service: Endoscopy;  Laterality: N/A;   laparoscopic sleeve gastrectomy N/A    Surgeon- Greig Right Teppara   PARTIAL COLECTOMY  2009   POLYPECTOMY     TUBAL LIGATION  1989   Patient Active Problem List   Diagnosis Date Noted   Hyperlipidemia 02/11/2023   Encounter for weight management 12/18/2022   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   Sebaceous cyst 11/02/2019   OSA (obstructive sleep apnea) 03/22/2018   Trochanteric bursitis of right hip 05/14/2017   Morbid obesity (HCC) 08/01/2016   IFG (impaired fasting glucose) 07/14/2016   Primary osteoarthritis of both hands 08/03/2015   Elevated LDL cholesterol level 09/16/2013   Trigger thumb of left hand 09/16/2013   Knee osteoarthritis 09/16/2013   HTN (hypertension), benign 09/16/2013   IDA (iron deficiency  anemia) 09/16/2013   Asthma 09/16/2013   Unspecified vitamin D deficiency 09/16/2013   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    PCP: Agapito Games, MD  REFERRING PROVIDER: Agapito Games, MD  REFERRING DIAG:  M79.18 (ICD-10-CM) - Right buttock pain  M17.0 (ICD-10-CM) - Primary osteoarthritis of both knees    THERAPY DIAG:  Chronic pain of right knee  Stiffness of right knee, not elsewhere classified  Chronic pain of left knee  Stiffness of left knee, not elsewhere classified  Pain in right hip  Rationale for Evaluation and Treatment: Rehabilitation  ONSET DATE: 02/09/2023  SUBJECTIVE:   SUBJECTIVE STATEMENT: "The pain feels about the same today."  PERTINENT HISTORY:  PAIN:  Are you having pain? 3/10  bil knees  PRECAUTIONS: ICD/Pacemaker  WEIGHT BEARING RESTRICTIONS: No  FALLS:  Has patient fallen in last 6 months? No  LIVING ENVIRONMENT: Lives with: lives with their spouse Lives in: House/apartment Stairs: no STE, 7 steps up, 7 steps down split level home  Has following equipment at home: Single point cane  OCCUPATION: nurse case manager   PLOF: Independent, Independent with basic ADLs, Independent with gait, and Independent with transfers  PATIENT GOALS: know what I can do to prevent  or reduce pain flares   NEXT MD VISIT: Dr. Linford Arnold May 8th   OBJECTIVE:   DIAGNOSTIC FINDINGS:   PATIENT SURVEYS:  LEFS 13/80  COGNITION: Overall cognitive status: Within functional limits for tasks assessed     SENSATION: Not tested no complaints   MUSCLE LENGTH:  HS WNL B  Piriformis L WNL, R moderate limitation   LOWER EXTREMITY ROM:  Active ROM Right eval Left eval Right 03/11/23 Left 03/11/23 Right 03/16/23 Left 03/16/23 Right/Left AROM  Hip flexion         Knee flexion 60* 75* 80 84 79 75 77/83  Knee extension 0* 0*        (Blank rows = not tested)  LOWER EXTREMITY MMT:  MMT Right eval Left eval Right 03/26/23  Left 03/26/23 Right 04/09/23 Left 04/09/23  Hip flexion 3- 3- 4 4 4+ 4  Hip extension     4+ 4  Hip abduction 3 4 4  4+ 4 4+  Hip adduction        Hip internal rotation        Hip external rotation        Knee flexion 4 4 4+ 4+    Knee extension 4+ 4+      Ankle dorsiflexion 4+ 4+       (Blank rows = not tested)  GAIT: Distance walked: in clinic distances  Assistive device utilized: Single point cane Level of assistance: Modified independence Comments: antalgic gait pattern with wide BOS, limited stance times and step lengths   TODAY'S TREATMENT:                                                                                                                              DATE: 04/22/23 Therapeutic Exercise: to improve strength and mobility.  Demo, verbal and tactile cues throughout for technique.  Nustep L6x45min Gait with new walking stick 180 ft Deadlift x 10  Hip hike on step 2x10 bil Clock balance 2x R/L 1/2 circle pattern w/ walking stick for support Retro step 5x with walking stick  Knee flexion 15# 2x10 04/16/23 Therapeutic Exercise: to improve strength and mobility.  Demo, verbal and tactile cues throughout for technique.  Nustep L6x40min Standing lunge stretch x 20 R/L Step ups on 2' book x 10 R/L SLS 2x for 10 sec holds Lateral toe taps to 2' book x 10 bil Leg press 15# x 25  Knee extension 15# 2x10 Knee flexion 15# 2x10  04/09/23 Therapeutic Activity: to improve assess performance. Assessed LTGs also spoke with patient about progress and outlook on PT Therapeutic Exercise: to improve strength and mobility.  Demo, verbal and tactile cues throughout for technique.  Nustep L5x60min Sit to stand x 10  Deadlift with slight bend in knees x 10 counter support Standing knee flexion stretch on step x 10 bil counter support   04/06/23  Pt seen for aquatic therapy today.  Treatment took place in water 3.5-4.75 ft in  depth at the [facility] pool. Temp of water was 91.   Pt entered/exited the pool via stair using step to pattern with hand rail.  *intro to setting *UE support barbell: forward walking x 4 widths, back and side stepping x 2.  Slight unsteadiness with backward *standing ue support wall 3.6 ft: toe raises, heel raises, high knee marching; hip extension, hip add/abd; relaxed squats  - wall and HB support: hip circles cw&ccw R/L 2 set x 10-12 *Gait training ues support yell HB: forward (cues heel toe); backward (toe heel increased step length) *hip hiking bottom step R/L x 10   Pt requires the buoyancy and hydrostatic pressure of water for support, and to offload joints by unweighting joint load by at least 50 % in navel deep water and by at least 75-80% in chest to neck deep water.  Viscosity of the water is needed for resistance of strengthening. Water current perturbations provides challenge to standing balance requiring increased core activation.     04/02/23: Nustep L5, 6 min Side steps green TB 4x10 ft Knee extension 10# x 15 BLE; 5# x 15 R/L each Knee flexion 20# x 15 BLE; 10# x 15 R/L each Standing march 2# 2x10 2HHA Standing HS curls 2# 2x10 2HHA Standing for B toe raises and B plantar flexor stretch with forefeet on 1" block, 2 x 15 Seated for L lateral distal quads cross friction massage, myofascial release  PATIENT EDUCATION:  Education details: DN education and aftercare  Person educated: Patient Education method: Explanation, Demonstration, and Handouts Education comprehension: verbalized understanding, returned demonstration, and needs further education  HOME EXERCISE PROGRAM: Access Code: [email or web address] URL: [email or web address] Date: 03/09/2023 Prepared by: Raynelle Fanning  Exercises - Sitting Knee Extension with Resistance  - 1 x daily - 7 x weekly - 3 sets - 10 reps - Seated March with Resistance  - 1 x daily - 7 x weekly - 3 sets - 10 reps - Seated Hip Abduction with Resistance  - 1 x daily - 7 x weekly - 3 sets -  10 reps - Sit to Stand  - 3 x daily - 7 x weekly - 1-2 sets - 10 reps - Standing Hip Abduction with Resistance at Ankles and Counter Support  - 1 x daily - 7 x weekly - 3 sets - 10 reps - Standing Hip Extension with Resistance at Ankles and Counter Support  - 1 x daily - 7 x weekly - 3 sets - 10 reps - Standing Knee Flexion Stretch on Step  - 1 x daily - 7 x weekly - 2 sets - 10 reps  ASSESSMENT:  CLINICAL IMPRESSION: Pt arrives reporting not much change in bil knee pain. She has new walking sticks which help her to walk more upright. We worked on [employer] stabilization exercises as well as quad facilitation to improve proprioception. Postural cues were required with standing exercises.    OBJECTIVE IMPAIRMENTS: Abnormal gait, difficulty walking, decreased ROM, decreased strength, increased fascial restrictions, impaired flexibility, obesity, and pain.   ACTIVITY LIMITATIONS: standing, squatting, stairs, transfers, and locomotion level  PARTICIPATION LIMITATIONS: driving, shopping, community activity, occupation, and yard work  PERSONAL FACTORS: Age, Behavior pattern, Education, Fitness, Past/current experiences, Social background, and Time since onset of injury/illness/exacerbation are also affecting patient's functional outcome.   REHAB POTENTIAL: Fair chronicity of pain, obesity   CLINICAL DECISION MAKING: Stable/uncomplicated  EVALUATION COMPLEXITY: Low   GOALS: Goals reviewed with patient? Yes  SHORT TERM GOALS: Target date: 03/19/2023   Will  be compliant with appropriate progressive HEP  Baseline: Goal status: IN PROGRESS only partially compliant  2.  Knee flexion ROM to improve by 15 degrees B  Baseline:  Goal status: PARTIALLY MET R knee met.  3.  R knee extension ROM to be no more than 5 degrees  Baseline:  Goal status: MET  4.  Piriformis mm spasm and flexibility impairment to be resolved  Baseline:  Goal status: MET  LONG TERM GOALS: Target date: 04/09/2023 extended  to 04/30/23    MMT to improve by 1 grade in all weak groups  Baseline:  Goal status: IN PROGRESS- improved 04/09/23 check MMT chart  2.  Pain to be no more than 2/10 at worst in B knees and R piriformis/glute  Baseline:  Goal status: IN PROGRESS - 04/09/23 5-6/10 most days  3.  Will tolerate advanced HEP vs gym level programming to maintain functional status and prevent recurrence of severe pain  Baseline:  Goal status: MET- 04/09/23  4.  LEFS score to improve by at least 15 points to show improvement in functional status  Baseline:  13/80 Goal status: MET-  03/31/23; 34 / 80 = 42.5 %    PLAN:  PT FREQUENCY: 2x/week  PT DURATION: 6 weeks  PLANNED INTERVENTIONS: Therapeutic exercises, Therapeutic activity, Neuromuscular re-education, Balance training, Gait training, Patient/Family education, Self Care, Joint mobilization, Stair training, DME instructions, Aquatic Therapy, Dry Needling, Electrical stimulation, Spinal mobilization, Cryotherapy, Moist heat, Taping, Ultrasound, Ionotophoresis 4mg /ml Dexamethasone, Manual therapy, and Re-evaluation  PLAN FOR NEXT SESSION: split between land and water therapies; prepare for D/C  Darleene Cleaver, PTA  04/22/2023 3:32 PM

## 2023-04-29 ENCOUNTER — Ambulatory Visit (HOSPITAL_BASED_OUTPATIENT_CLINIC_OR_DEPARTMENT_OTHER): Payer: Medicare Other | Admitting: Physical Therapy

## 2023-04-29 ENCOUNTER — Encounter (HOSPITAL_BASED_OUTPATIENT_CLINIC_OR_DEPARTMENT_OTHER): Payer: Self-pay | Admitting: Physical Therapy

## 2023-04-29 DIAGNOSIS — G8929 Other chronic pain: Secondary | ICD-10-CM

## 2023-04-29 DIAGNOSIS — M25561 Pain in right knee: Secondary | ICD-10-CM | POA: Diagnosis not present

## 2023-04-29 DIAGNOSIS — M25661 Stiffness of right knee, not elsewhere classified: Secondary | ICD-10-CM

## 2023-04-29 NOTE — Therapy (Signed)
OUTPATIENT PHYSICAL THERAPY TREATMENT       Patient Name: [name] MRN: [id number] DOB:04-29-1956, 67 y.o., female Today's Date: 04/29/2023  END OF SESSION:  PT End of Session - 04/29/23 0832     Visit Number 18    Number of Visits 19    Date for PT Re-Evaluation 04/30/23    Authorization Type MCR/[employer]    PT Start Time 0829    PT Stop Time 0900    PT Time Calculation (min) 31 min    Activity Tolerance Patient tolerated treatment well    Behavior During Therapy [facility] for tasks assessed/performed                     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   Blood transfusion without reported diagnosis    1980's   Colon cancer (HCC) 2009   colon   GERD (gastroesophageal reflux disease)    Hiatal hernia    Hypertension    Past Surgical History:  Procedure Laterality Date   CESAREAN SECTION     1982, 1988, 1989   CHOLECYSTECTOMY  1980   COLONOSCOPY     COLONOSCOPY WITH PROPOFOL N/A 10/02/2015   Procedure: COLONOSCOPY WITH PROPOFOL;  Surgeon: Meryl Dare, MD;  Location: [facility];  Service: Endoscopy;  Laterality: N/A;   laparoscopic sleeve gastrectomy N/A    Surgeon- Greig Right Teppara   PARTIAL COLECTOMY  2009   POLYPECTOMY     TUBAL LIGATION  1989   Patient Active Problem List   Diagnosis Date Noted   Hyperlipidemia 02/11/2023   Encounter for weight management 12/18/2022   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   Sebaceous cyst 11/02/2019   OSA (obstructive sleep apnea) 03/22/2018   Trochanteric bursitis of right hip 05/14/2017   Morbid obesity (HCC) 08/01/2016   IFG (impaired fasting glucose) 07/14/2016   Primary osteoarthritis of both hands 08/03/2015   Elevated LDL cholesterol level 09/16/2013   Trigger thumb of left hand 09/16/2013   Knee osteoarthritis 09/16/2013   HTN (hypertension), benign 09/16/2013   IDA (iron deficiency  anemia) 09/16/2013   Asthma 09/16/2013   Unspecified vitamin D deficiency 09/16/2013   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    PCP: Agapito Games, MD  REFERRING PROVIDER: Agapito Games, MD  REFERRING DIAG:  M79.18 (ICD-10-CM) - Right buttock pain  M17.0 (ICD-10-CM) - Primary osteoarthritis of both knees    THERAPY DIAG:  Chronic pain of right knee  Stiffness of right knee, not elsewhere classified  Chronic pain of left knee  Rationale for Evaluation and Treatment: Rehabilitation  ONSET DATE: 02/09/2023  SUBJECTIVE:   SUBJECTIVE STATEMENT: "No changes in knee pain"  PERTINENT HISTORY:  PAIN:  Are you having pain? 3/10  bil knees  PRECAUTIONS: ICD/Pacemaker  WEIGHT BEARING RESTRICTIONS: No  FALLS:  Has patient fallen in last 6 months? No  LIVING ENVIRONMENT: Lives with: lives with their spouse Lives in: House/apartment Stairs: no STE, 7 steps up, 7 steps down split level home  Has following equipment at home: Single point cane  OCCUPATION: nurse case manager   PLOF: Independent, Independent with basic ADLs, Independent with gait, and Independent with transfers  PATIENT GOALS: know what I can do to prevent or reduce pain flares   NEXT MD VISIT: Dr. Linford Arnold May 8th  OBJECTIVE:   DIAGNOSTIC FINDINGS:   PATIENT SURVEYS:  LEFS 13/80  COGNITION: Overall cognitive status: Within functional limits for tasks assessed     SENSATION: Not tested no complaints   MUSCLE LENGTH:  HS WNL B  Piriformis L WNL, R moderate limitation   LOWER EXTREMITY ROM:  Active ROM Right eval Left eval Right 03/11/23 Left 03/11/23 Right 03/16/23 Left 03/16/23 Right/Left AROM  Hip flexion         Knee flexion 60* 75* 80 84 79 75 77/83  Knee extension 0* 0*        (Blank rows = not tested)  LOWER EXTREMITY MMT:  MMT Right eval Left eval Right 03/26/23 Left 03/26/23 Right 04/09/23 Left 04/09/23  Hip flexion 3- 3- 4 4 4+ 4  Hip extension      4+ 4  Hip abduction 3 4 4  4+ 4 4+  Hip adduction        Hip internal rotation        Hip external rotation        Knee flexion 4 4 4+ 4+    Knee extension 4+ 4+      Ankle dorsiflexion 4+ 4+       (Blank rows = not tested)  GAIT: Distance walked: in clinic distances  Assistive device utilized: Single point cane Level of assistance: Modified independence Comments: antalgic gait pattern with wide BOS, limited stance times and step lengths   TODAY'S TREATMENT:                                                                                                                              DATE: 04/29/23 Pt seen for aquatic therapy today.  Treatment took place in water 3.5-4.75 ft in depth at the [facility] pool. Temp of water was 91.  Pt entered/exited the pool via stair using step to pattern with hand rail.  - Walking  - Heel Toe Raises at [employer]   - Standing March at [facility]   - Squat   - Standing Hip Abduction Adduction at [facility]   - Standing Hip Flexion Extension at [facility]  - Standing Hip Circles at [facility]   - Standing Hip Internal and External Rotation   - Standing 3-Way Leg Reach   - Single Leg Stance at [facility]   -tandem forward walking  Pt requires the buoyancy and hydrostatic pressure of water for support, and to offload joints by unweighting joint load by at least 50 % in navel deep water and by at least 75-80% in chest to neck deep water.  Viscosity of the water is needed for resistance of strengthening. Water current perturbations provides challenge to standing balance requiring increased core activation.  04/22/23 Therapeutic Exercise: to improve strength and mobility.  Demo, verbal and tactile cues throughout for technique.  Nustep L6x67min Gait with new walking stick 180 ft Deadlift x 10  Hip hike on step 2x10 bil  Clock balance 2x R/L 1/2 circle pattern w/ walking stick for support Retro step 5x with walking stick  Knee flexion 15#  2x10 04/16/23 Therapeutic Exercise: to improve strength and mobility.  Demo, verbal and tactile cues throughout for technique.  Nustep L6x65min Standing lunge stretch x 20 R/L Step ups on 2' book x 10 R/L SLS 2x for 10 sec holds Lateral toe taps to 2' book x 10 bil Leg press 15# x 25  Knee extension 15# 2x10 Knee flexion 15# 2x10  04/09/23 Therapeutic Activity: to improve assess performance. Assessed LTGs also spoke with patient about progress and outlook on PT Therapeutic Exercise: to improve strength and mobility.  Demo, verbal and tactile cues throughout for technique.  Nustep L5x39min Sit to stand x 10  Deadlift with slight bend in knees x 10 counter support Standing knee flexion stretch on step x 10 bil counter support   04/06/23  Pt seen for aquatic therapy today.  Treatment took place in water 3.5-4.75 ft in depth at the [facility] pool. Temp of water was 91.  Pt entered/exited the pool via stair using step to pattern with hand rail.  *intro to setting *UE support barbell: forward walking x 4 widths, back and side stepping x 2.  Slight unsteadiness with backward *standing ue support wall 3.6 ft: toe raises, heel raises, high knee marching; hip extension, hip add/abd; relaxed squats  - wall and HB support: hip circles cw&ccw R/L 2 set x 10-12 *Gait training ues support yell HB: forward (cues heel toe); backward (toe heel increased step length) *hip hiking bottom step R/L x 10   Pt requires the buoyancy and hydrostatic pressure of water for support, and to offload joints by unweighting joint load by at least 50 % in navel deep water and by at least 75-80% in chest to neck deep water.  Viscosity of the water is needed for resistance of strengthening. Water current perturbations provides challenge to standing balance requiring increased core activation.     04/02/23: Nustep L5, 6 min Side steps green TB 4x10 ft Knee extension 10# x 15 BLE; 5# x 15 R/L each Knee flexion  20# x 15 BLE; 10# x 15 R/L each Standing march 2# 2x10 2HHA Standing HS curls 2# 2x10 2HHA Standing for B toe raises and B plantar flexor stretch with forefeet on 1" block, 2 x 15 Seated for L lateral distal quads cross friction massage, myofascial release  PATIENT EDUCATION:  Education details: DN education and aftercare  Person educated: Patient Education method: Explanation, Demonstration, and Handouts Education comprehension: verbalized understanding, returned demonstration, and needs further education  HOME EXERCISE PROGRAM: Access Code: [email or web address] URL: [email or web address] Date: 03/09/2023 Prepared by: Raynelle Fanning  Exercises - Sitting Knee Extension with Resistance  - 1 x daily - 7 x weekly - 3 sets - 10 reps - Seated March with Resistance  - 1 x daily - 7 x weekly - 3 sets - 10 reps - Seated Hip Abduction with Resistance  - 1 x daily - 7 x weekly - 3 sets - 10 reps - Sit to Stand  - 3 x daily - 7 x weekly - 1-2 sets - 10 reps - Standing Hip Abduction with Resistance at Ankles and Counter Support  - 1 x daily - 7 x weekly - 3 sets - 10 reps - Standing Hip Extension with Resistance at Ankles and Counter Support  - 1 x daily - 7 x weekly - 3 sets - 10 reps - Standing Knee  Flexion Stretch on Step  - 1 x daily - 7 x weekly - 2 sets - 10 reps  Access Code: [email or web address] URL: [email or web address] Date: 04/29/2023 Prepared by: Geni Bers  This aquatic home exercise program from MedBridge utilizes pictures from land based exercises, but has been adapted prior to lamination and issuance.   Exercises - Walking  - Heel Toe Raises at [facility]  - 1 x daily - 1-2 x weekly - 1-2 sets - 10 reps - Standing March at [facility]  - 1 x daily - 1-2 x weekly - 1-2 sets - 10 reps - Squat  - 1 x daily - 1-2 x weekly - 1-2 sets - 10 reps - Standing Hip Abduction Adduction at [facility] Wall  - 1 x daily - 1-2 x weekly - 1-2 sets - 10 reps - Standing Hip Flexion Extension at [employer]  - 1 x daily - 1-2 x weekly - 1-2 sets - 10 reps - Standing Hip Circles at [employer]  - 1 x daily - 1-2 x weekly - 1-2 sets - 10 reps - Standing Hip Internal and External Rotation  - 1 x daily - 1-2 x weekly - 1-2 sets - 10 reps - Water Step Up on Bottom Step  - 1 x daily - 1-2 x weekly - 1-2 sets - 10 reps - Standing 3-Way Leg Reach  - 1 x daily - 1-2 x weekly - 10 reps - Single Leg Stance at Pool Wall    ASSESSMENT:  CLINICAL IMPRESSION: Pt instructed and issued laminated final aquatic HEP.  She requires minor vc and demonstration as well as clarifications on program.  Written modifications added to laminated sheet.  She is planning on getting access to pool after return from a trip in the few weeks to complete HEP. She tolerates well without increase in pain.  She has reached her max potential in setting without need for return.  Next/last scheduled session land based. Goals ongoing   OBJECTIVE IMPAIRMENTS: Abnormal gait, difficulty walking, decreased ROM, decreased strength, increased fascial restrictions, impaired flexibility, obesity, and pain.   ACTIVITY LIMITATIONS: standing, squatting, stairs, transfers, and locomotion level  PARTICIPATION LIMITATIONS: driving, shopping, community activity, occupation, and yard work  PERSONAL FACTORS: Age, Behavior pattern, Education, Fitness, Past/current experiences, Social background, and Time since onset of injury/illness/exacerbation are also affecting patient's functional outcome.   REHAB POTENTIAL: Fair chronicity of pain, obesity   CLINICAL DECISION MAKING: Stable/uncomplicated  EVALUATION COMPLEXITY: Low   GOALS: Goals reviewed with patient? Yes  SHORT TERM GOALS: Target date: 03/19/2023   Will be compliant with appropriate progressive HEP  Baseline: Goal status: IN PROGRESS only partially compliant  2.  Knee flexion ROM to improve by 15 degrees B  Baseline:  Goal status: PARTIALLY MET R knee met.  3.  R knee extension  ROM to be no more than 5 degrees  Baseline:  Goal status: MET  4.  Piriformis mm spasm and flexibility impairment to be resolved  Baseline:  Goal status: MET  LONG TERM GOALS: Target date: 04/09/2023 extended to 04/30/23    MMT to improve by 1 grade in all weak groups  Baseline:  Goal status: IN PROGRESS- improved 04/09/23 check MMT chart  2.  Pain to be no more than 2/10 at worst in B knees and R piriformis/glute  Baseline:  Goal status: IN PROGRESS - 04/09/23 5-6/10 most days  3.  Will tolerate advanced HEP vs gym level programming to maintain functional status  and prevent recurrence of severe pain  Baseline:  Goal status: MET- 04/09/23  4.  LEFS score to improve by at least 15 points to show improvement in functional status  Baseline:  13/80 Goal status: MET-  03/31/23; 34 / 80 = 42.5 %    PLAN:  PT FREQUENCY: 2x/week  PT DURATION: 6 weeks  PLANNED INTERVENTIONS: Therapeutic exercises, Therapeutic activity, Neuromuscular re-education, Balance training, Gait training, Patient/Family education, Self Care, Joint mobilization, Stair training, DME instructions, Aquatic Therapy, Dry Needling, Electrical stimulation, Spinal mobilization, Cryotherapy, Moist heat, Taping, Ultrasound, Ionotophoresis 4mg /ml Dexamethasone, Manual therapy, and Re-evaluation  PLAN FOR NEXT SESSION: split between land and water therapies; prepare for D/C  Corrie Dandy Tomma Lightning) Wilberth Damon MPT 04/29/2023 8:33 AM

## 2023-04-30 ENCOUNTER — Ambulatory Visit: Payer: Medicare Other

## 2023-04-30 ENCOUNTER — Other Ambulatory Visit: Payer: Self-pay

## 2023-04-30 DIAGNOSIS — M25561 Pain in right knee: Secondary | ICD-10-CM | POA: Diagnosis not present

## 2023-04-30 DIAGNOSIS — M25661 Stiffness of right knee, not elsewhere classified: Secondary | ICD-10-CM

## 2023-04-30 DIAGNOSIS — M25551 Pain in right hip: Secondary | ICD-10-CM

## 2023-04-30 DIAGNOSIS — M25662 Stiffness of left knee, not elsewhere classified: Secondary | ICD-10-CM

## 2023-04-30 DIAGNOSIS — G8929 Other chronic pain: Secondary | ICD-10-CM

## 2023-04-30 NOTE — Therapy (Signed)
OUTPATIENT PHYSICAL THERAPY DISCHARGE SUMMARY   Progress Note Reporting Period 02/26/23 to 04/30/23  See note below for Objective Data and Assessment of Progress/Goals.        Patient Name: [name] MRN: [id number] DOB:[birth year], 67 y.o., female Today's Date: 04/30/2023  END OF SESSION:  PT End of Session - 04/30/23 0803     Visit Number 19    Date for PT Re-Evaluation 04/30/23    Authorization Type MCR/[employer]    PT Start Time 0802    PT Stop Time 0846    PT Time Calculation (min) 44 min    Activity Tolerance Patient tolerated treatment well    Behavior During Therapy [facility] for tasks assessed/performed                      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   Blood transfusion without reported diagnosis    1980's   Colon cancer (HCC) 2009   colon   GERD (gastroesophageal reflux disease)    Hiatal hernia    Hypertension    Past Surgical History:  Procedure Laterality Date   CESAREAN SECTION     1982, 1988, 1989   CHOLECYSTECTOMY  1980   COLONOSCOPY     COLONOSCOPY WITH PROPOFOL N/A 10/02/2015   Procedure: COLONOSCOPY WITH PROPOFOL;  Surgeon: Meryl Dare, MD;  Location: [facility];  Service: Endoscopy;  Laterality: N/A;   laparoscopic sleeve gastrectomy N/A    Surgeon- Greig Right Teppara   PARTIAL COLECTOMY  2009   POLYPECTOMY     TUBAL LIGATION  1989   Patient Active Problem List   Diagnosis Date Noted   Hyperlipidemia 02/11/2023   Encounter for weight management 12/18/2022   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   Sebaceous cyst 11/02/2019   OSA (obstructive sleep apnea) 03/22/2018   Trochanteric bursitis of right hip 05/14/2017   Morbid obesity (HCC) 08/01/2016   IFG (impaired fasting glucose) 07/14/2016   Primary osteoarthritis of both hands 08/03/2015   Elevated LDL cholesterol level 09/16/2013   Trigger thumb of left  hand 09/16/2013   Knee osteoarthritis 09/16/2013   HTN (hypertension), benign 09/16/2013   IDA (iron deficiency anemia) 09/16/2013   Asthma 09/16/2013   Unspecified vitamin D deficiency 09/16/2013   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    PCP: Agapito Games, MD  REFERRING PROVIDER: Agapito Games, MD  REFERRING DIAG:  M79.18 (ICD-10-CM) - Right buttock pain  M17.0 (ICD-10-CM) - Primary osteoarthritis of both knees    THERAPY DIAG:  Chronic pain of right knee  Stiffness of right knee, not elsewhere classified  Chronic pain of left knee  Stiffness of left knee, not elsewhere classified  Pain in right hip  Rationale for Evaluation and Treatment: Rehabilitation  ONSET DATE: 02/09/2023  SUBJECTIVE:   SUBJECTIVE STATEMENT: "The pain feels about the same today."  PERTINENT HISTORY:  PAIN:  Are you having pain? 3/10  bil knees  PRECAUTIONS: ICD/Pacemaker  WEIGHT BEARING RESTRICTIONS: No  FALLS:  Has patient fallen in last 6 months? No  LIVING ENVIRONMENT: Lives with: lives with their spouse Lives in: House/apartment Stairs: no STE, 7 steps up, 7 steps down split level home  Has following equipment at home: Single point cane  OCCUPATION: nurse case manager   PLOF: Independent, Independent with basic ADLs, Independent  with gait, and Independent with transfers  PATIENT GOALS: know what I can do to prevent or reduce pain flares   NEXT MD VISIT: Dr. Linford Arnold May 8th   OBJECTIVE:   DIAGNOSTIC FINDINGS:   PATIENT SURVEYS:  LEFS 13/80  COGNITION: Overall cognitive status: Within functional limits for tasks assessed     SENSATION: Not tested no complaints   MUSCLE LENGTH:  HS WNL B  Piriformis L WNL, R moderate limitation   LOWER EXTREMITY ROM:  Active ROM Right eval Left eval Right 03/11/23 Left 03/11/23 Right 03/16/23 Left 03/16/23 Right/Left AROM R/L AROM  Hip flexion          Knee flexion 60* 75* 80 84 79 75 77/83  77/81  Knee extension 0* 0*         (Blank rows = not tested)  LOWER EXTREMITY MMT:  MMT Right eval Left eval Right 03/26/23 Left 03/26/23 Right 04/09/23  Left 04/09/23 Left 04/30/23 Right 04/30/23  Hip flexion 3- 3- 4 4 4+  4 4+ 4+  Hip extension     4+  4    Hip abduction 3 4 4  4+ 4  4+    Hip adduction           Hip internal rotation           Hip external rotation           Knee flexion 4 4 4+ 4+    5 5  Knee extension 4+ 4+      5 5  Ankle dorsiflexion 4+ 4+      4+ 4+   (Blank rows = not tested)  GAIT: Distance walked: in clinic distances  Assistive device utilized: Single point cane Level of assistance: Modified independence Comments: antalgic gait pattern with wide BOS, limited stance times and step lengths   TODAY'S TREATMENT:                                                                                                                              DATE: 04/30/23 Nustep level 4 6 min 30 sec Clock balance 2x R/L 1/2 circle pattern w/ walking stick for support Knee flexion 15# 2x10 Trigger Point Dry-Needling  Treatment instructions: Expect mild to moderate muscle soreness. S/S of pneumothorax if dry needled over a lung field, and to seek immediate medical attention should they occur. Patient verbalized understanding of these instructions and education. Patient Consent Given: Yes Education handout provided: No Muscles treated: R piriformis, R glut med, R glut max, R glut min Electrical stimulation performed: No Parameters: N/A Treatment response/outcome: Twitch Response Elicited and Palpable Increase in Muscle Length  Moist heat applied B knees and lower back for 10 min after session per patient's request, not included in session or charges  04/22/23 Therapeutic Exercise: to improve strength and mobility.  Demo, verbal and tactile cues throughout for technique.  Nustep L6x76min Gait with new walking stick 180 ft Deadlift x 10  Hip hike on step 2x10 bil Clock balance  2x R/L 1/2 circle pattern w/ walking stick for support Retro step 5x with walking stick  Knee flexion 15# 2x10 04/16/23 Therapeutic Exercise: to improve strength and mobility.  Demo, verbal and tactile cues throughout for technique.  Nustep L6x23min Standing lunge stretch x 20 R/L Step ups on 2' book x 10 R/L SLS 2x for 10 sec holds Lateral toe taps to 2' book x 10 bil Leg press 15# x 25  Knee extension 15# 2x10 Knee flexion 15# 2x10  04/09/23 Therapeutic Activity: to improve assess performance. Assessed LTGs also spoke with patient about progress and outlook on PT Therapeutic Exercise: to improve strength and mobility.  Demo, verbal and tactile cues throughout for technique.  Nustep L5x81min Sit to stand x 10  Deadlift with slight bend in knees x 10 counter support Standing knee flexion stretch on step x 10 bil counter support   04/06/23  Pt seen for aquatic therapy today.  Treatment took place in water 3.5-4.75 ft in depth at the [facility] pool. Temp of water was 91.  Pt entered/exited the pool via stair using step to pattern with hand rail.  *intro to setting *UE support barbell: forward walking x 4 widths, back and side stepping x 2.  Slight unsteadiness with backward *standing ue support wall 3.6 ft: toe raises, heel raises, high knee marching; hip extension, hip add/abd; relaxed squats  - wall and HB support: hip circles cw&ccw R/L 2 set x 10-12 *Gait training ues support yell HB: forward (cues heel toe); backward (toe heel increased step length) *hip hiking bottom step R/L x 10   Pt requires the buoyancy and hydrostatic pressure of water for support, and to offload joints by unweighting joint load by at least 50 % in navel deep water and by at least 75-80% in chest to neck deep water.  Viscosity of the water is needed for resistance of strengthening. Water current perturbations provides challenge to standing balance requiring increased core  activation.     04/02/23: Nustep L5, 6 min Side steps green TB 4x10 ft Knee extension 10# x 15 BLE; 5# x 15 R/L each Knee flexion 20# x 15 BLE; 10# x 15 R/L each Standing march 2# 2x10 2HHA Standing HS curls 2# 2x10 2HHA Standing for B toe raises and B plantar flexor stretch with forefeet on 1" block, 2 x 15 Seated for L lateral distal quads cross friction massage, myofascial release  PATIENT EDUCATION:  Education details: DN education and aftercare  Person educated: Patient Education method: Explanation, Demonstration, and Handouts Education comprehension: verbalized understanding, returned demonstration, and needs further education  HOME EXERCISE PROGRAM: Access Code: [email or web address] URL: [email or web address] Date: 03/09/2023 Prepared by: Raynelle Fanning  Exercises - Sitting Knee Extension with Resistance  - 1 x daily - 7 x weekly - 3 sets - 10 reps - Seated March with Resistance  - 1 x daily - 7 x weekly - 3 sets - 10 reps - Seated Hip Abduction with Resistance  - 1 x daily - 7 x weekly - 3 sets - 10 reps - Sit to Stand  - 3 x daily - 7 x weekly - 1-2 sets - 10 reps - Standing Hip Abduction with Resistance at Ankles and Counter Support  - 1 x daily - 7 x weekly - 3 sets - 10 reps - Standing Hip Extension with Resistance at Ankles and Counter Support  - 1 x daily - 7 x weekly - 3 sets -  10 reps - Standing Knee Flexion Stretch on Step  - 1 x daily - 7 x weekly - 2 sets - 10 reps  ASSESSMENT:  CLINICAL IMPRESSION: Pt arrives reporting she is ready for discharge.  She has enjoyed the aquatic PT and plans to join gym at [employer].  She reports she is now ready to lose weight and be able to get TKA's.  Overall her MMT was much improved as well as LEFS.  She still has knee pain but to be expected with advanced DJD.  DC today from skilled PT.   OBJECTIVE IMPAIRMENTS: Abnormal gait, difficulty walking, decreased ROM, decreased strength, increased fascial restrictions, impaired  flexibility, obesity, and pain.   ACTIVITY LIMITATIONS: standing, squatting, stairs, transfers, and locomotion level  PARTICIPATION LIMITATIONS: driving, shopping, community activity, occupation, and yard work  PERSONAL FACTORS: Age, Behavior pattern, Education, Fitness, Past/current experiences, Social background, and Time since onset of injury/illness/exacerbation are also affecting patient's functional outcome.   REHAB POTENTIAL: Fair chronicity of pain, obesity   CLINICAL DECISION MAKING: Stable/uncomplicated  EVALUATION COMPLEXITY: Low   GOALS: Goals reviewed with patient? Yes  SHORT TERM GOALS: Target date: 03/19/2023   Will be compliant with appropriate progressive HEP  Baseline: Goal status: IN PROGRESS only partially compliant  2.  Knee flexion ROM to improve by 15 degrees B  Baseline:  Goal status: PARTIALLY MET R knee met.  3.  R knee extension ROM to be no more than 5 degrees  Baseline:  Goal status: MET  4.  Piriformis mm spasm and flexibility impairment to be resolved  Baseline:  Goal status: MET  LONG TERM GOALS: Target date: 04/09/2023 extended to 04/30/23    MMT to improve by 1 grade in all weak groups  Baseline:  Goal status: IN PROGRESS- improved 04/09/23 check MMT chart 04/30/23:  improved with MMT to wnl  2.  Pain to be no more than 2/10 at worst in B knees and R piriformis/glute  Baseline:  Goal status: IN PROGRESS - 04/09/23 5-6/10 most days  3.  Will tolerate advanced HEP vs gym level programming to maintain functional status and prevent recurrence of severe pain  Baseline:  Goal status: MET- 04/09/23 04/30/23: patient is joining the gym at [place] and will be participating in water exercises and utilizing the gym as well  4.  LEFS score to improve by at least 15 points to show improvement in functional status  Baseline:  13/80 Goal status: MET-  03/31/23; 34 / 80 = 42.5 % 04/30/23:  31/80: 36%     PLAN:  PT FREQUENCY: 2x/week  PT DURATION: 6  weeks  PLANNED INTERVENTIONS: Therapeutic exercises, Therapeutic activity, Neuromuscular re-education, Balance training, Gait training, Patient/Family education, Self Care, Joint mobilization, Stair training, DME instructions, Aquatic Therapy, Dry Needling, Electrical stimulation, Spinal mobilization, Cryotherapy, Moist heat, Taping, Ultrasound, Ionotophoresis 4mg /ml Dexamethasone, Manual therapy, and Re-evaluation  PLAN FOR NEXT SESSION: DC today  Tiernan Millikin L Wandell Scullion, PT  04/30/2023 12:23 PM

## 2023-05-04 ENCOUNTER — Ambulatory Visit: Payer: TRICARE For Life (TFL) | Admitting: Family Medicine

## 2023-06-08 ENCOUNTER — Ambulatory Visit: Payer: TRICARE For Life (TFL) | Admitting: Family Medicine

## 2023-06-29 LAB — HM MAMMOGRAPHY

## 2023-07-15 ENCOUNTER — Encounter: Payer: Self-pay | Admitting: Family Medicine

## 2023-07-27 ENCOUNTER — Ambulatory Visit: Payer: TRICARE For Life (TFL) | Admitting: Family Medicine

## 2023-08-31 ENCOUNTER — Ambulatory Visit: Payer: TRICARE For Life (TFL) | Admitting: Family Medicine

## 2023-10-09 ENCOUNTER — Ambulatory Visit (INDEPENDENT_AMBULATORY_CARE_PROVIDER_SITE_OTHER): Payer: Medicare Other | Admitting: Family Medicine

## 2023-10-09 VITALS — BP 121/68 | HR 103 | Ht 60.0 in | Wt 244.0 lb

## 2023-10-09 DIAGNOSIS — M19049 Primary osteoarthritis, unspecified hand: Secondary | ICD-10-CM

## 2023-10-09 DIAGNOSIS — R7309 Other abnormal glucose: Secondary | ICD-10-CM

## 2023-10-09 DIAGNOSIS — R252 Cramp and spasm: Secondary | ICD-10-CM

## 2023-10-09 DIAGNOSIS — Z7689 Persons encountering health services in other specified circumstances: Secondary | ICD-10-CM

## 2023-10-09 DIAGNOSIS — R350 Frequency of micturition: Secondary | ICD-10-CM | POA: Diagnosis not present

## 2023-10-09 DIAGNOSIS — Z23 Encounter for immunization: Secondary | ICD-10-CM | POA: Diagnosis not present

## 2023-10-09 DIAGNOSIS — J4540 Moderate persistent asthma, uncomplicated: Secondary | ICD-10-CM | POA: Diagnosis not present

## 2023-10-09 LAB — POCT URINALYSIS DIP (CLINITEK)
Glucose, UA: NEGATIVE mg/dL
Leukocytes, UA: NEGATIVE
Nitrite, UA: NEGATIVE
Spec Grav, UA: >=1.03 — AB (ref 1.010–1.025)
Urobilinogen, UA: 1 U/dL
pH, UA: 5.5 (ref 5.0–8.0)

## 2023-10-09 MED ORDER — FLUTICASONE PROPIONATE 50 MCG/ACT NA SUSP: 2.0000 | Freq: Every day | NASAL | 6 refills | Status: AC

## 2023-10-09 MED ORDER — NITROFURANTOIN MONOHYD MACRO 100 MG PO CAPS: 100.0000 mg | ORAL_CAPSULE | Freq: Two times a day (BID) | ORAL | 0 refills | Status: DC

## 2023-10-09 MED ORDER — ALBUTEROL SULFATE (2.5 MG/3ML) 0.083% IN NEBU
2.5000 mg | INHALATION_SOLUTION | RESPIRATORY_TRACT | 2 refills | Status: AC | PRN
Start: 2023-10-09 — End: ?

## 2023-10-09 MED ORDER — ALBUTEROL SULFATE HFA 108 (90 BASE) MCG/ACT IN AERS
2.0000 | INHALATION_SPRAY | Freq: Four times a day (QID) | RESPIRATORY_TRACT | 2 refills | Status: AC | PRN
Start: 2023-10-09 — End: ?

## 2023-10-09 NOTE — Patient Instructions (Signed)
Once the insurance rolls over in January let me know what weight loss medicines are covered on your current plan.  You can always just send me a note through MyChart if you would like.

## 2023-10-09 NOTE — Assessment & Plan Note (Signed)
She reports that she is doing well overall she still had a little bit of a chronic cough but otherwise no recent flares or exacerbations but she does need refills on her inhalers.

## 2023-10-09 NOTE — Assessment & Plan Note (Addendum)
Once the insurance rolls over in January let me know what weight loss medicines are covered on your current plan.  You can always just send me a note through MyChart if you would like.

## 2023-10-09 NOTE — Progress Notes (Unsigned)
Established Patient Office Visit  Subjective   Patient ID: [name], female    DOB: Nov 27, 1955  Age: 67 y.o. MRN: [id number]  Chief Complaint  Patient presents with   Weight Check   Urinary Frequency    X3 days     HPI  Hypertension- Pt denies chest pain, SOB, dizziness, or heart palpitations.  Taking meds as directed w/o problems.  Denies medication side effects.    Follow-up weight management- She had previously tried Qsymia but it caused heartburn.  And we tried to get Contrave covered but she reports that she is not actually taking it.  F/U Asthma - doing ok overall.   Has been going to the [facility] and pain ctr in [place] and received an injection in your knee.    She also feels like she is getting some arthritis in her hands she is starting to get some joint soreness and discomfort particularly with twisting etc.  She sometimes feels a little weak.  She occasionally gets hand cramps with certain activities like using her mixer.  C/O of urinary urgency and frequency most at night. Started suddenly.  Some discomfort. No hematuria.   {History (Optional):23778}  ROS    Objective:     BP 121/68   Pulse (!) 103   Ht 5' (1.524 m)   Wt 244 lb (110.7 kg)   SpO2 100%   BMI 47.65 kg/m  {Vitals History (Optional):23777}  Physical Exam Vitals and nursing note reviewed.  Constitutional:      Appearance: Normal appearance.  HENT:     Head: Normocephalic and atraumatic.  Eyes:     Conjunctiva/sclera: Conjunctivae normal.  Cardiovascular:     Rate and Rhythm: Normal rate and regular rhythm.  Pulmonary:     Effort: Pulmonary effort is normal.     Breath sounds: Normal breath sounds.  Skin:    General: Skin is warm and dry.  Neurological:     Mental Status: She is alert.  Psychiatric:        Mood and Affect: Mood normal.      Results for orders placed or performed in visit on 10/09/23  POCT URINALYSIS DIP (CLINITEK)  Result Value Ref Range   Color, UA  yellow yellow   Clarity, UA clear clear   Glucose, UA negative negative mg/dL   Bilirubin, UA small (A) negative   Ketones, POC UA trace (5) (A) negative mg/dL   Spec Grav, UA >=8.295 (A) 1.010 - 1.025   Blood, UA trace-lysed (A) negative   pH, UA 5.5 5.0 - 8.0   POC PROTEIN,UA trace negative, trace   Urobilinogen, UA 1.0 0.2 or 1.0 E.U./dL   Nitrite, UA Negative Negative   Leukocytes, UA Negative Negative    {Labs (Optional):23779}  The 10-year ASCVD risk score (Arnett DK, et al., 2019) is: 9%    Assessment & Plan:   Problem List Items Addressed This Visit       Respiratory   Asthma    She reports that she is doing well overall she still had a little bit of a chronic cough but otherwise no recent flares or exacerbations but she does need refills on her inhalers.      Relevant Medications   albuterol (PROVENTIL) (2.5 MG/3ML) 0.083% nebulizer solution   albuterol (VENTOLIN HFA) 108 (90 Base) MCG/ACT inhaler   Other Relevant Orders   CMP14+EGFR   Hemoglobin A1c   CK (Creatine Kinase)   Magnesium   CBC with Differential/Platelet  Other   Encounter for weight management - Primary    Once the insurance rolls over in January let me know what weight loss medicines are covered on your current plan.  You can always just send me a note through MyChart if you would like.      Other Visit Diagnoses     Urinary frequency       Relevant Orders   POCT URINALYSIS DIP (CLINITEK) (Completed)   CMP14+EGFR   Hemoglobin A1c   CK (Creatine Kinase)   Magnesium   CBC with Differential/Platelet   Urine Culture   Hand cramps       Relevant Orders   CMP14+EGFR   Hemoglobin A1c   CK (Creatine Kinase)   Magnesium   CBC with Differential/Platelet   Abnormal glucose       Relevant Orders   CMP14+EGFR   Hemoglobin A1c   CK (Creatine Kinase)   Magnesium   CBC with Differential/Platelet   Encounter for immunization       Relevant Orders   Flu Vaccine Trivalent High Dose  (Fluad) (Completed)   Hand arthritis           UTI - will x with macrobrid. Will send for culture.   Return in about 6 months (around 04/08/2024).    Nani Gasser, MD

## 2023-10-10 LAB — CMP14+EGFR
ALT: 9 [IU]/L (ref 0–32)
AST: 13 [IU]/L (ref 0–40)
Albumin: 4 g/dL (ref 3.9–4.9)
Alkaline Phosphatase: 130 [IU]/L — ABNORMAL HIGH (ref 44–121)
BUN/Creatinine Ratio: 13 (ref 12–28)
BUN: 12 mg/dL (ref 8–27)
Bilirubin Total: 0.8 mg/dL (ref 0.0–1.2)
CO2: 25 mmol/L (ref 20–29)
Calcium: 9.4 mg/dL (ref 8.7–10.3)
Chloride: 103 mmol/L (ref 96–106)
Creatinine, Ser: 0.95 mg/dL (ref 0.57–1.00)
Globulin, Total: 3 g/dL (ref 1.5–4.5)
Glucose: 96 mg/dL (ref 70–99)
Potassium: 4.4 mmol/L (ref 3.5–5.2)
Sodium: 141 mmol/L (ref 134–144)
Total Protein: 7 g/dL (ref 6.0–8.5)
eGFR: 66 mL/min/{1.73_m2} (ref >59)

## 2023-10-10 LAB — CK: Total CK: 79 U/L (ref 32–182)

## 2023-10-10 LAB — CBC WITH DIFFERENTIAL/PLATELET
Basophils Absolute: 0 10*3/uL (ref 0.0–0.2)
Basos: 0 %
EOS (ABSOLUTE): 0.2 10*3/uL (ref 0.0–0.4)
Eos: 2 %
Hematocrit: 48.5 % — ABNORMAL HIGH (ref 34.0–46.6)
Hemoglobin: 14.8 g/dL (ref 11.1–15.9)
Immature Grans (Abs): 0 10*3/uL (ref 0.0–0.1)
Immature Granulocytes: 0 %
Lymphocytes Absolute: 1.3 10*3/uL (ref 0.7–3.1)
Lymphs: 14 %
MCH: 24.1 pg — ABNORMAL LOW (ref 26.6–33.0)
MCHC: 30.5 g/dL — ABNORMAL LOW (ref 31.5–35.7)
MCV: 79 fL (ref 79–97)
Monocytes Absolute: 0.8 10*3/uL (ref 0.1–0.9)
Monocytes: 8 %
Neutrophils Absolute: 7.1 10*3/uL — ABNORMAL HIGH (ref 1.4–7.0)
Neutrophils: 76 %
Platelets: 295 10*3/uL (ref 150–450)
RBC: 6.13 x10E6/uL — ABNORMAL HIGH (ref 3.77–5.28)
RDW: 15.2 % (ref 11.7–15.4)
WBC: 9.5 10*3/uL (ref 3.4–10.8)

## 2023-10-10 LAB — MAGNESIUM: Magnesium: 2.2 mg/dL (ref 1.6–2.3)

## 2023-10-10 LAB — HEMOGLOBIN A1C
Est. average glucose Bld gHb Est-mCnc: 120 mg/dL
Hgb A1c MFr Bld: 5.8 % — ABNORMAL HIGH (ref 4.8–5.6)

## 2023-10-11 LAB — URINE CULTURE

## 2023-10-12 NOTE — Progress Notes (Signed)
Hi [name], metabolic panel overall looks good except the alkaline phosphatase, which is a liver enzyme, is just mildly elevated.  Not in a worrisome range but we do need to follow that and plan to recheck again in about 2 months.  Hemoglobin A1c was 5.8 so into the prediabetes range just really work on cutting back on sweets and carbs and breads and rice and pastas.  Hemoglobin looks good.  Urine culture is negative just shows skin bacteria.  Muscle enzyme level is normal magnesium is normal.  Potassium is also normal so nothing specifically off to cause the cramping.

## 2023-10-13 ENCOUNTER — Other Ambulatory Visit: Payer: Self-pay | Admitting: *Deleted

## 2023-10-13 DIAGNOSIS — R748 Abnormal levels of other serum enzymes: Secondary | ICD-10-CM

## 2023-10-14 ENCOUNTER — Encounter: Payer: Self-pay | Admitting: Family Medicine

## 2023-10-18 ENCOUNTER — Other Ambulatory Visit: Payer: Self-pay

## 2023-10-18 ENCOUNTER — Ambulatory Visit: Payer: Medicare Other

## 2023-10-18 ENCOUNTER — Ambulatory Visit
Admission: EM | Admit: 2023-10-18 | Discharge: 2023-10-18 | Disposition: A | Discharge status: Home / Self Care | Payer: Medicare Other | Attending: Family Medicine | Admitting: Family Medicine

## 2023-10-18 DIAGNOSIS — R5383 Other fatigue: Secondary | ICD-10-CM | POA: Diagnosis not present

## 2023-10-18 DIAGNOSIS — R059 Cough, unspecified: Secondary | ICD-10-CM | POA: Diagnosis not present

## 2023-10-18 DIAGNOSIS — R9389 Abnormal findings on diagnostic imaging of other specified body structures: Secondary | ICD-10-CM | POA: Diagnosis not present

## 2023-10-18 LAB — POCT INFLUENZA A/B
Influenza A, POC: NEGATIVE
Influenza B, POC: NEGATIVE

## 2023-10-18 LAB — POC SARS CORONAVIRUS 2 AG -  ED: SARS Coronavirus 2 Ag: NEGATIVE

## 2023-10-18 MED ORDER — HYDROCODONE BIT-HOMATROP MBR 5-1.5 MG/5ML PO SOLN: 5.0000 mL | Freq: Four times a day (QID) | ORAL | 0 refills | Status: DC | PRN

## 2023-10-18 MED ORDER — BENZONATATE 200 MG PO CAPS: 200.0000 mg | ORAL_CAPSULE | Freq: Three times a day (TID) | ORAL | 0 refills | Status: AC | PRN

## 2023-10-18 MED ORDER — LEVOFLOXACIN 500 MG PO TABS: 500.0000 mg | ORAL_TABLET | Freq: Every day | ORAL | 0 refills | Status: AC

## 2023-10-18 NOTE — ED Notes (Addendum)
Rounding on patient Patient requesting a snack, patients spouse went to lobby and obtained pretzels for the patient. Patient and spouse made aware of status of radiology readings.

## 2023-10-18 NOTE — ED Provider Notes (Signed)
[facility]    CSN: [id number] Arrival date & time: 10/18/23  0954      History   Chief Complaint Chief Complaint  Patient presents with   Fatigue   Shortness of Breath    HPI [name] is a 67 y.o. female.   HPI Pleasant 67 year old female presents with cough, fatigue, shortness of breath for 3 days.  Additionally, patient reports a rash of bilateral knees since this morning. PMH significant for morbid obesity, HTN, and colon cancer.  Patient is accompanied by her husband this morning.  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   Sebaceous cyst 11/02/2019   OSA (obstructive sleep apnea) 03/22/2018   Trochanteric bursitis of right hip 05/14/2017   Morbid obesity (HCC) 08/01/2016   IFG (impaired fasting glucose) 07/14/2016   Primary osteoarthritis of both hands 08/03/2015   Elevated LDL cholesterol level 09/16/2013   Trigger thumb of left hand 09/16/2013   Knee osteoarthritis 09/16/2013   IDA (iron deficiency anemia) 09/16/2013   Asthma 09/16/2013   Vitamin D deficiency 09/16/2013   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    Past Surgical History:  Procedure Laterality Date   CESAREAN SECTION     1982, 1988, 1989   CHOLECYSTECTOMY  1980   COLONOSCOPY     COLONOSCOPY WITH PROPOFOL N/A 10/02/2015   Procedure: COLONOSCOPY WITH PROPOFOL;  Surgeon: Meryl Dare, MD;  Location: [facility];  Service: Endoscopy;  Laterality: N/A;   laparoscopic sleeve gastrectomy N/A    Surgeon- Greig Right Teppara   PARTIAL  COLECTOMY  2009   POLYPECTOMY     TUBAL LIGATION  1989    OB History     Gravida  3   Para  3   Term  3   Preterm      AB      Living         SAB      IAB      Ectopic      Multiple      Live Births  3            Home Medications    Prior to Admission medications   Medication Sig Start Date End Date Taking? Authorizing Provider  benzonatate (TESSALON) 200 MG capsule Take 1 capsule (200 mg total) by mouth 3 (three) times daily as needed for up to 7 days. 10/18/23 10/25/23 Yes Trevor Iha, FNP  HYDROcodone bit-homatropine (HYCODAN) 5-1.5 MG/5ML syrup Take 5 mLs by mouth every 6 (six) hours as needed for cough. 10/18/23  Yes Trevor Iha, FNP  levofloxacin (LEVAQUIN) 500 MG tablet Take 1 tablet (500 mg total) by mouth daily for 10 days. 10/18/23 10/28/23 Yes Trevor Iha, FNP  acetaminophen (TYLENOL) 500 MG tablet Take 500 mg by mouth every 6 (six) hours as needed.    [provider]  albuterol (PROVENTIL) (2.5 MG/3ML) 0.083% nebulizer solution Take 3 mLs (2.5 mg total) by nebulization every  4 (four) hours as needed for wheezing or shortness of breath. 10/09/23   Agapito Games, MD  albuterol (VENTOLIN HFA) 108 (90 Base) MCG/ACT inhaler Inhale 2 puffs into the lungs every 6 (six) hours as needed for wheezing or shortness of breath. 10/09/23   Agapito Games, MD  cetirizine (ZYRTEC) 10 MG tablet Take 10 mg by mouth daily.     [provider]  fluticasone (FLONASE) 50 MCG/ACT nasal spray Place 2 sprays into both nostrils daily. 10/09/23   Agapito Games, MD  Multiple Vitamin (MULTIVITAMIN) tablet Take 1 tablet by mouth daily.    [provider]  nitrofurantoin, macrocrystal-monohydrate, (MACROBID) 100 MG capsule Take 1 capsule (100 mg total) by mouth 2 (two) times daily. 10/09/23   Agapito Games, MD    Family History Family History  Problem Relation Age of Onset   Hypertension Father    Heart attack Father     Stroke Paternal Grandmother    Hypertension Paternal Grandfather    Colon cancer Neg Hx    Stomach cancer Neg Hx    Esophageal cancer Neg Hx     Social History Social History   Tobacco Use   Smoking status: Never   Smokeless tobacco: Never  Vaping Use   Vaping status: Never Used  Substance Use Topics   Alcohol use: No    Alcohol/week: 0.0 standard drinks of alcohol   Drug use: No     Allergies   Augmentin [amoxicillin-pot clavulanate]   Review of Systems Review of Systems  Constitutional:  Positive for fever.  Respiratory:  Positive for cough and shortness of breath.   All other systems reviewed and are negative.    Physical Exam Triage Vital Signs ED Triage Vitals [10/18/23 1008]  Encounter Vitals Group     BP      Systolic BP Percentile      Diastolic BP Percentile      Pulse      Resp      Temp      Temp src      SpO2      Weight      Height      Head Circumference      Peak Flow      Pain Score 8     Pain Loc      Pain Education      Exclude from Growth Chart    No data found.  Updated Vital Signs BP 106/68 (BP Location: Left Arm)   Pulse (!) 119   Temp 98.6 F (37 C)   Resp 20   SpO2 97%   Physical Exam Vitals and nursing note reviewed.  Constitutional:      General: She is not in acute distress.    Appearance: She is obese. She is ill-appearing. She is not toxic-appearing.  HENT:     Head: Normocephalic and atraumatic.     Right Ear: Tympanic membrane, ear canal and external ear normal.     Left Ear: Tympanic membrane, ear canal and external ear normal.     Mouth/Throat:     Mouth: Mucous membranes are moist.     Pharynx: Oropharynx is clear.  Eyes:     Extraocular Movements: Extraocular movements intact.     Conjunctiva/sclera: Conjunctivae normal.     Pupils: Pupils are equal, round, and reactive to light.  Cardiovascular:     Rate and Rhythm: Normal rate and regular rhythm.     Pulses: Normal pulses.  Heart sounds:  Normal heart sounds.  Pulmonary:     Effort: Pulmonary effort is normal.     Breath sounds: No wheezing, rhonchi or rales.     Comments: Diminished breath sounds noted throughout with infrequent cough on exam Musculoskeletal:        General: Normal range of motion.     Cervical back: Normal range of motion and neck supple.  Skin:    General: Skin is warm and dry.  Neurological:     General: No focal deficit present.     Mental Status: She is alert and oriented to person, place, and time. Mental status is at baseline.  Psychiatric:        Mood and Affect: Mood normal.        Behavior: Behavior normal.        Thought Content: Thought content normal.      UC Treatments / Results  Labs (all labs ordered are listed, but only abnormal results are displayed) Labs Reviewed  POCT INFLUENZA A/B - Normal  POC SARS CORONAVIRUS 2 AG -  ED    EKG   Radiology DG Chest 2 View Result Date: 10/18/2023 CLINICAL DATA:  67 year old female with cough and fatigue for 1 week. Congestion, abnormal pulmonary auscultation. EXAM: CHEST - 2 VIEW COMPARISON:  Chest radiographs 08/17/2008. FINDINGS: PA and lateral views 1118 hours. Lung volumes and mediastinal contours have not significantly changed, within normal limits. Coarse bilateral pulmonary interstitial opacity, chronic but substantially increased since 2009. Visualized tracheal air column is within normal limits. No pleural fluid. No consolidation. No convincing pulmonary edema. No acute osseous abnormality identified. Cholecystectomy clips. Negative visible bowel gas. IMPRESSION: Widespread bilateral pulmonary interstitial opacity has progressed since 2009. Favor acute viral/atypical respiratory infection in this clinical setting. Progressed chronic interstitial lung disease felt less likely. Electronically Signed   By: Odessa Fleming M.D.   On: 10/18/2023 11:45    Procedures Procedures (including critical care time)  Medications Ordered in  UC Medications - No data to display  Initial Impression / Assessment and Plan / UC Course  I have reviewed the triage vital signs and the nursing notes.  Pertinent labs & imaging results that were available during my care of the patient were reviewed by me and considered in my medical decision making (see chart for details).     MDM: 1.  Cough-suspicious for right upper lobe CAP will treat with Levaquin 500 mg tablet: Take 1 tablet daily x 10 days, Rx'd Tessalon 200 mg capsule: Take 1 capsule 3 times daily, as needed for cough, Rx'd Hycodan 5-1.5 mg / 5 mL syrup: Take 5 mL every 6 hours for cough; 2.  Abnormal chest x-ray-CXR revealed above.  Advised patient repeat CXR on or about 11/18/2023 to ensure infection has been resolved; 3.  Fatigue, unspecified type-advised patient current symptoms are related to current lung infection. Advised patient of chest x-ray results with hardcopy and images provided.  Advised patient to take medication as directed with food to completion.  Advised may take Tessalon capsules daily or as needed for cough.  Advised may use Hycodan cough syrup at night prior to sleep for cough due to sedative effects.  Encouraged increase daily water intake to 64 ounces per day while taking these medications.  Advised patient to repeat chest x-ray on or about 11/18/2023 to ensure infection has resolved.  Patient discharged home, hemodynamically stable. Final Clinical Impressions(s) / UC Diagnoses   Final diagnoses:  Fatigue, unspecified type  Cough, unspecified  type  Abnormal chest x-ray     Discharge Instructions      Advised patient of chest x-ray results with hardcopy and images provided.  Advised patient to take medication as directed with food to completion.  Advised may take Tessalon capsules daily or as needed for cough.  Advised may use Hycodan cough syrup at night prior to sleep for cough due to sedative effects.  Encouraged increase daily water intake to 64 ounces per  day while taking these medications.  Advised patient to repeat chest x-ray on or about 11/18/2023 to ensure infection has resolved.     ED Prescriptions     Medication Sig Dispense Auth. Provider   levofloxacin (LEVAQUIN) 500 MG tablet Take 1 tablet (500 mg total) by mouth daily for 10 days. 10 tablet Trevor Iha, FNP   benzonatate (TESSALON) 200 MG capsule Take 1 capsule (200 mg total) by mouth 3 (three) times daily as needed for up to 7 days. 40 capsule Trevor Iha, FNP   HYDROcodone bit-homatropine (HYCODAN) 5-1.5 MG/5ML syrup Take 5 mLs by mouth every 6 (six) hours as needed for cough. 120 mL Trevor Iha, FNP      I have reviewed the PDMP during this encounter.   Trevor Iha, FNP 10/18/23 1226

## 2023-10-18 NOTE — Discharge Instructions (Addendum)
Advised patient of chest x-ray results with hardcopy and images provided.  Advised patient to take medication as directed with food to completion.  Advised may take Tessalon capsules daily or as needed for cough.  Advised may use Hycodan cough syrup at night prior to sleep for cough due to sedative effects.  Encouraged increase daily water intake to 64 ounces per day while taking these medications.  Advised patient to repeat chest x-ray on or about 11/18/2023 to ensure infection has resolved.

## 2023-10-18 NOTE — ED Triage Notes (Addendum)
Pt reports 3 days of SHOB with activity, wheezing, fatigue, and increased thirst.  The patient also report she has a rash to both knees that she noticed today.   Home interventions: tylenol (taken 2 hours ago per pt), albuterol inhaler

## 2023-10-19 ENCOUNTER — Ambulatory Visit: Payer: Medicare Other | Admitting: Family Medicine

## 2023-10-19 ENCOUNTER — Telehealth: Payer: Self-pay | Admitting: Family Medicine

## 2023-10-19 DIAGNOSIS — J4540 Moderate persistent asthma, uncomplicated: Secondary | ICD-10-CM

## 2023-10-19 DIAGNOSIS — J45909 Unspecified asthma, uncomplicated: Secondary | ICD-10-CM

## 2023-10-19 NOTE — Telephone Encounter (Signed)
This patient spoke with Medicare wellness nurse and said that she needed a nebulizer she was just diagnosed with pneumonia.  Order printed. Please fax to any [facility] supplier

## 2023-10-19 NOTE — Progress Notes (Signed)
Spoke with patient, she ws diagnosed with pneumonia 10/18/23. She will reschedule this medicare wellness visit for later date.

## 2023-10-19 NOTE — Telephone Encounter (Signed)
Copied from CRM [id number]. Topic: Clinical - Home Health Verbal Orders >> Oct 19, 2023  4:32 PM Almira Coaster wrote: Caller/Agency: [facility] Callback Number: [phone] Service Requested: Order with providers NPI for a Nebulizer to be faxed to [phone] Frequency:  Any new concerns about the patient?

## 2023-10-20 MED ORDER — AMBULATORY NON FORMULARY MEDICATION: 0 refills | Status: DC

## 2023-10-20 NOTE — Telephone Encounter (Signed)
[facility] IS CALLING BACK FOR PATIENT STATING THAT THEY NEED LENGTH OF TIME PT NEEDS TO USE  THE NEBULIZER NEEDS TO BE LISTED ON THE ORDER

## 2023-10-20 NOTE — Telephone Encounter (Signed)
Order placed and faxed

## 2023-10-21 NOTE — Telephone Encounter (Signed)
I faxed the paperwork again circling the length of time on the paperwork. Also added NPI.

## 2023-10-23 NOTE — Telephone Encounter (Signed)
Did we get all faxed for her?

## 2023-10-26 ENCOUNTER — Other Ambulatory Visit: Payer: Self-pay

## 2023-10-26 DIAGNOSIS — J45909 Unspecified asthma, uncomplicated: Secondary | ICD-10-CM

## 2023-10-26 MED ORDER — AMBULATORY NON FORMULARY MEDICATION: 0 refills | Status: DC

## 2023-10-26 NOTE — Telephone Encounter (Signed)
Spoke with patient. She is feeling a lot better and breathing a lot better - not coughing as much  but ears still feel full.  She had heard from nebulizer company but they wanted her to jump through numerous hoops to get the nebulizer machine that she declined their services.  She is going to reach out to local pharmacies to see if she can locate one that she can just purchase the machine only from and will reach out to our office when she knows where to send the prescription for this.

## 2023-10-26 NOTE — Telephone Encounter (Signed)
Patient called back- requesting nebulizer order  be faxed to  [facility] Faxed order today 10/26/23 @ 12:03pm =kph

## 2023-10-29 ENCOUNTER — Telehealth: Payer: Self-pay

## 2023-10-29 NOTE — Telephone Encounter (Signed)
Copied from CRM [id number]. Topic: General - Other >> Oct 29, 2023  9:35 AM [name] wrote: Reason for CRM: [facility]/ [facility]. Wanted to note patient was upset and refused the nebulizer that was ordered for her.

## 2023-10-29 NOTE — Telephone Encounter (Signed)
OK, what was she upset about?  Looks like [facility] had refaxed everything on the 18th because they said they were missing a little bit of information.

## 2023-10-30 NOTE — Telephone Encounter (Signed)
Telephone message 10/19/23- patient did not want to "jump through hoops " that [facility] required  She only wanted to purchase the nebulizer machine only.  Order was sent to [facility] at patient's request.

## 2023-11-10 ENCOUNTER — Ambulatory Visit: Payer: Medicare Other | Admitting: Family Medicine

## 2023-11-10 ENCOUNTER — Encounter: Payer: Self-pay | Admitting: Family Medicine

## 2023-11-10 VITALS — Ht 60.0 in | Wt 244.0 lb

## 2023-11-10 DIAGNOSIS — Z0000 Encounter for general adult medical examination without abnormal findings: Secondary | ICD-10-CM | POA: Diagnosis not present

## 2023-11-10 NOTE — Progress Notes (Signed)
 Subjective:   [name] is a 68 y.o. female who presents for Medicare Annual (Subsequent) preventive examination.  Visit Complete: Virtual I connected with  [name] on 11/10/23 by a audio enabled telemedicine application and verified that I am speaking with the correct person using two identifiers.  Patient Location: Home  Provider Location: Office/Clinic  I discussed the limitations of evaluation and management by telemedicine. The patient expressed understanding and agreed to proceed.  Vital Signs: Because this visit was a virtual/telehealth visit, some criteria may be missing or patient reported. Any vitals not documented were not able to be obtained and vitals that have been documented are patient reported.  Patient Medicare AWV questionnaire was completed by the patient on 10/09/23; I have confirmed that all information answered by patient is correct and no changes since this date.  Cardiac Risk Factors include: advanced age (>42men, >46 women);obesity (BMI >30kg/m2)     Objective:    Today's Vitals   11/10/23 1428 11/10/23 1431  Weight: 244 lb (110.7 kg)   Height: 5' (1.524 m)   PainSc:  3    Body mass index is 47.65 kg/m.     11/10/2023    2:41 PM 02/26/2023    2:05 PM 02/09/2023    9:33 AM 10/13/2022    1:20 PM 05/21/2017    8:04 AM 10/02/2015    9:40 AM 09/11/2014    8:23 AM  Advanced Directives  Does Patient Have a Medical Advance Directive? Yes Yes Yes No No No No  Type of Estate Agent of [place];Living will Healthcare Power of [place];Living will Healthcare Power of [place];Living will      Does patient want to make changes to medical advance directive? No - Patient declined  No - Guardian declined      Copy of Healthcare Power of Attorney in Chart?  No - copy requested No - copy requested      Would patient like information on creating a medical advance directive?    No - Patient declined No - Patient declined No - patient  declined information     Current Medications (verified) Outpatient Encounter Medications as of 11/10/2023  Medication Sig   acetaminophen  (TYLENOL ) 500 MG tablet Take 500 mg by mouth every 6 (six) hours as needed.   albuterol  (PROVENTIL ) (2.5 MG/3ML) 0.083% nebulizer solution Take 3 mLs (2.5 mg total) by nebulization every 4 (four) hours as needed for wheezing or shortness of breath.   albuterol  (VENTOLIN  HFA) 108 (90 Base) MCG/ACT inhaler Inhale 2 puffs into the lungs every 6 (six) hours as needed for wheezing or shortness of breath.   AMBULATORY NON FORMULARY MEDICATION Medication Name: nebulizer with tubing. DX: J45.909 Fax: [facility] [phone]   cetirizine (ZYRTEC) 10 MG tablet Take 10 mg by mouth daily.    fluticasone  (FLONASE ) 50 MCG/ACT nasal spray Place 2 sprays into both nostrils daily.   Multiple Vitamin (MULTIVITAMIN) tablet Take 1 tablet by mouth daily.   HYDROcodone  bit-homatropine (HYCODAN) 5-1.5 MG/5ML syrup Take 5 mLs by mouth every 6 (six) hours as needed for cough. (Patient not taking: Reported on 11/10/2023)   nitrofurantoin , macrocrystal-monohydrate, (MACROBID ) 100 MG capsule Take 1 capsule (100 mg total) by mouth 2 (two) times daily. (Patient not taking: Reported on 11/10/2023)   No facility-administered encounter medications on file as of 11/10/2023.    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   Blood transfusion without reported diagnosis    1980's   Colon cancer (HCC) 2009   colon   GERD (gastroesophageal reflux disease)    Hiatal hernia    Hypertension    Past Surgical History:  Procedure Laterality Date   CESAREAN SECTION     1982, 1988, 1989   CHOLECYSTECTOMY  1980   COLONOSCOPY     COLONOSCOPY WITH PROPOFOL  N/A 10/02/2015   Procedure: COLONOSCOPY WITH PROPOFOL ;  Surgeon: Gwendlyn ONEIDA Buddy,  MD;  Location: [facility];  Service: Endoscopy;  Laterality: N/A;   laparoscopic sleeve gastrectomy N/A    Surgeon- Bethann Teppara   PARTIAL COLECTOMY  2009   POLYPECTOMY     TUBAL LIGATION  1989   Family History  Problem Relation Age of Onset   Hypertension Father    Heart attack Father    Stroke Paternal Grandmother    Hypertension Paternal Grandfather    Colon cancer Neg Hx    Stomach cancer Neg Hx    Esophageal cancer Neg Hx    Social History   Socioeconomic History   Marital status: Married    Spouse name: Lynwood   Number of children: 3   Years of education: 16   Highest education level: Bachelor's degree (e.g., BA, AB, BS)  Occupational History   Occupation: Retired [employer]  Tobacco Use   Smoking status: Never   Smokeless tobacco: Never  Vaping Use   Vaping status: Never Used  Substance and Sexual Activity   Alcohol use: No    Alcohol/week: 0.0 standard drinks of alcohol   Drug use: No   Sexual activity: Not on file  Other Topics Concern   Not on file  Social History Narrative   Lives with her husband. She enjoys reading.   Social Drivers of Corporate Investment Banker Strain: Low Risk  (11/10/2023)   Overall Financial Resource Strain (CARDIA)    Difficulty of Paying Living Expenses: Not hard at all  Food Insecurity: No Food Insecurity (11/10/2023)   Hunger Vital Sign    Worried About Running Out of Food in the Last Year: Never true    Ran Out of Food in the Last Year: Never true  Transportation Needs: No Transportation Needs (11/10/2023)   PRAPARE - Administrator, Civil Service (Medical): No    Lack of Transportation (Non-Medical): No  Physical Activity: Sufficiently Active (11/10/2023)   Exercise Vital Sign    Days of Exercise per Week: 3 days    Minutes of Exercise per Session: 60 min  Stress: No Stress Concern Present (11/10/2023)   [employer] of Occupational Health - Occupational Stress Questionnaire    Feeling of Stress : Only a little   Social Connections: Socially Integrated (11/10/2023)   Social Connection and Isolation Panel [NHANES]    Frequency of Communication with Friends and Family: More than three times a week    Frequency of Social Gatherings with Friends and Family: Once a week    Attends Religious Services: More than 4 times per year    Active Member of [employer] or Organizations: Yes    Attends Banker Meetings: Never    Marital Status: Married    Tobacco Counseling Counseling given: Not Answered   Clinical Intake:  Pre-visit preparation completed: Yes  Pain : 0-10 Pain Score: 3  Pain Type: Chronic pain Pain Location: Knee Pain Orientation: Right, Left  Pain Descriptors / Indicators: Constant Pain Onset: More than a month ago Pain Frequency: Constant Pain Relieving Factors: heat, tylenol  Effect of Pain on Daily Activities: affects walking  Pain Relieving Factors: heat, tylenol   BMI - recorded: 47.6 Nutritional Status: BMI > 30  Obese Nutritional Risks: None Diabetes: No  How often do you need to have someone help you when you read instructions, pamphlets, or other written materials from your doctor or pharmacy?: 1 - Never What is the last grade level you completed in school?: 16  Interpreter Needed?: No      Activities of Daily Living    11/10/2023    2:34 PM  In your present state of health, do you have any difficulty performing the following activities:  Hearing? 0  Vision? 0  Comment wears reading glasses  Difficulty concentrating or making decisions? 0  Walking or climbing stairs? 1  Dressing or bathing? 0  Doing errands, shopping? 0  Preparing Food and eating ? N  Using the Toilet? N  In the past six months, have you accidently leaked urine? N  Do you have problems with loss of bowel control? N  Managing your Medications? N  Managing your Finances? N  Housekeeping or managing your Housekeeping? N    Patient Care Team: Alvan Dorothyann BIRCH, MD as PCP - General  (Family Medicine)   Indicate any recent Medical Services you may have received from other than [facility] providers in the past year (date may be approximate).     Assessment:   This is a routine wellness examination for [name].  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 Has hearing aids but does not wear them Vision Screening - Comments:: Unable to test, wears reading glasses, no issues reported.    Goals Addressed             This Visit's Progress    Set My Weight Loss Goal       Needs to lose 44 pounds Get knee surgery.       Depression Screen    11/10/2023    2:40 PM 02/09/2023    9:33 AM 12/18/2022    4:16 PM 10/13/2022    1:21 PM 03/12/2022    9:59 AM 03/12/2022    9:36 AM 02/05/2021    3:46 PM  PHQ 2/9 Scores  PHQ - 2 Score 0 0 0 0 0 0 0    Fall Risk    11/10/2023    2:42 PM 02/09/2023    9:32 AM 12/18/2022    4:16 PM 10/13/2022    1:21 PM 03/12/2022    9:59 AM  Fall Risk   Falls in the past year? 0 1 0 0 0  Number falls in past yr: 0 0 0 0 0  Injury with Fall? 0 0 0 0 0  Risk for fall due to : No Fall Risks History of fall(s);Orthopedic patient No Fall Risks No Fall Risks No Fall Risks  Follow up  Falls evaluation completed Falls evaluation completed Falls evaluation completed Falls prevention discussed    MEDICARE RISK AT HOME: Medicare Risk at Home Any stairs in or around the home?: Yes If so, are there any without handrails?: Yes Home free of loose throw rugs in walkways, pet beds, electrical cords, etc?: Yes Adequate lighting in your home to reduce risk of falls?: Yes Life alert?: No Use of a cane, walker or w/c?: Yes Grab bars in the bathroom?: No Shower chair  or bench in shower?: Yes Elevated toilet seat or a handicapped toilet?: Yes  TIMED UP AND GO:  Was the test performed?  No    Cognitive Function:        11/10/2023    2:44 PM 10/13/2022    1:34 PM  6CIT Screen  What Year? 0 points 0 points   What month? 0 points 0 points  What time? 0 points 0 points  Count back from 20 0 points 0 points  Months in reverse 0 points 0 points  Repeat phrase 0 points 0 points  Total Score 0 points 0 points    Immunizations Immunization History  Administered Date(s) Administered   Fluad Quad(high Dose 65+) 10/13/2022   Fluad Trivalent(High Dose 65+) 10/09/2023   Influenza,inj,Quad PF,6+ Mos 08/05/2017, 10/31/2019, 10/07/2021   Influenza,trivalent, recombinat, inj, PF 08/02/2012   Influenza-Unspecified 08/02/2012, 08/05/2017, 08/21/2018   PFIZER(Purple Top)SARS-COV-2 Vaccination 02/10/2020, 03/03/2020, 10/12/2020   PNEUMOCOCCAL CONJUGATE-20 02/09/2023   Pneumococcal Polysaccharide-23 06/30/2018, 08/21/2018   Tdap 08/02/2014   Zoster Recombinant(Shingrix) 10/31/2019, 06/01/2020    TDAP status: Up to date  Flu Vaccine status: Up to date  Pneumococcal vaccine status: Up to date  Covid-19 vaccine status: Declined, Education has been provided regarding the importance of this vaccine but patient still declined. Advised may receive this vaccine at local pharmacy or Health Dept.or vaccine clinic. Aware to provide a copy of the vaccination record if obtained from local pharmacy or Health Dept. Verbalized acceptance and understanding.  Qualifies for Shingles Vaccine? Yes   Zostavax completed No   Shingrix Completed?: Yes  Screening Tests Health Maintenance  Topic Date Due   COVID-19 Vaccine (4 - 2024-25 season) 07/05/2023   DTaP/Tdap/Td (2 - Td or Tdap) 08/02/2024   Medicare Annual Wellness (AWV)  11/09/2024   MAMMOGRAM  06/28/2025   Colonoscopy  05/10/2026   Pneumonia Vaccine 58+ Years old  Completed   INFLUENZA VACCINE  Completed   DEXA SCAN  Completed   Hepatitis C Screening  Completed   Zoster Vaccines- Shingrix  Completed   HPV VACCINES  Aged Out    Health Maintenance  Health Maintenance Due  Topic Date Due   COVID-19 Vaccine (4 - 2024-25 season) 07/05/2023    Colorectal  cancer screening: Type of screening: Colonoscopy. Completed 05/10/2021. Repeat every 5 years  Mammogram status: Completed 06/29/2023. Repeat every year  Bone Density status: Completed 11/26/22. Results reflect: Bone density results: OSTEOPENIA. Repeat every 2 years.  Lung Cancer Screening: (Low Dose CT Chest recommended if Age 34-80 years, 20 pack-year currently smoking OR have quit w/in 15years.) does not qualify.   Lung Cancer Screening Referral: n/a  Additional Screening:  Hepatitis C Screening: does qualify; Completed 08/03/2015  Vision Screening: Recommended annual ophthalmology exams for early detection of glaucoma and other disorders of the eye. Is the patient up to date with their annual eye exam?  No  Who is the provider or what is the name of the office in which the patient attends annual eye exams? [facility]  If pt is not established with a provider, would they like to be referred to a provider to establish care? No .   Dental Screening: Recommended annual dental exams for proper oral hygiene  Diabetic Foot Exam: n/a   Community Resource Referral / Chronic Care Management: CRR required this visit?  No   CCM required this visit?  No     Plan:     I have personally reviewed and noted the following in the  patient's chart:   Medical and social history Use of alcohol, tobacco or illicit drugs  Current medications and supplements including opioid prescriptions. Patient is not currently taking opioid prescriptions. Functional ability and status Nutritional status Physical activity Advanced directives List of other physicians Hospitalizations, surgeries, and ER visits in previous 12 months: has not been to hospital or admitted.  Vitals Screenings to include cognitive, depression, and falls Referrals and appointments  In addition, I have reviewed and discussed with patient certain preventive protocols, quality metrics, and best practice recommendations. A written  personalized care plan for preventive services as well as general preventive health recommendations were provided to patient.     Christina JONELLE Brownie, FNP   11/10/2023   After Visit Summary: (MyChart) Due to this being a telephonic visit, the after visit summary with patients personalized plan was offered to patient via MyChart   Follow-up with PCP as scheduled.  Declines future Covid vaccines.

## 2023-12-02 ENCOUNTER — Ambulatory Visit: Payer: Medicare Other

## 2023-12-02 ENCOUNTER — Ambulatory Visit (INDEPENDENT_AMBULATORY_CARE_PROVIDER_SITE_OTHER): Payer: Medicare Other | Admitting: Family Medicine

## 2023-12-02 ENCOUNTER — Encounter: Payer: Self-pay | Admitting: Family Medicine

## 2023-12-02 VITALS — BP 135/83 | HR 86 | Ht 60.0 in | Wt 247.0 lb

## 2023-12-02 DIAGNOSIS — Z6841 Body Mass Index (BMI) 40.0 and over, adult: Secondary | ICD-10-CM | POA: Diagnosis not present

## 2023-12-02 DIAGNOSIS — R9389 Abnormal findings on diagnostic imaging of other specified body structures: Secondary | ICD-10-CM | POA: Diagnosis not present

## 2023-12-02 DIAGNOSIS — R21 Rash and other nonspecific skin eruption: Secondary | ICD-10-CM

## 2023-12-02 DIAGNOSIS — Z7689 Persons encountering health services in other specified circumstances: Secondary | ICD-10-CM

## 2023-12-02 DIAGNOSIS — R059 Cough, unspecified: Secondary | ICD-10-CM

## 2023-12-02 MED ORDER — TIRZEPATIDE-WEIGHT MANAGEMENT 5 MG/0.5ML ~~LOC~~ SOLN: 5.0000 mg | SUBCUTANEOUS | 0 refills | Status: DC

## 2023-12-02 MED ORDER — TRIAMCINOLONE ACETONIDE 0.5 % EX OINT: 1.0000 | TOPICAL_OINTMENT | Freq: Two times a day (BID) | CUTANEOUS | 0 refills | Status: DC

## 2023-12-02 MED ORDER — TIRZEPATIDE-WEIGHT MANAGEMENT 2.5 MG/0.5ML ~~LOC~~ SOLN: 2.5000 mg | SUBCUTANEOUS | 0 refills | Status: DC

## 2023-12-02 NOTE — Assessment & Plan Note (Addendum)
Visit #: 1 Starting ZOXWRU:045 lbs / BMI 48   Current weight: 247 lbs  Previous weight:  Change in weight: Goal weight:  BMI 40  Dietary goals: Work on cutting back on soda and sweetened beverages such as juice.  She really struggles with that.  Really encouraged her to work on a strategy to cut back and increase water intake with the ultimate goal of completely stopping drinking soda and sweetened beverages.  Discussed the importance of getting adequate protein in and reviewed some foods that are high in protein Exercise goals: Stay active.  She is somewhat limited because of her knee arthritis. Medication: Start 2.5 mg Zepbound. Follow-up and referrals: 7 to 8 weeks.

## 2023-12-02 NOTE — Progress Notes (Signed)
Established Patient Office Visit  Subjective  Patient ID: [name], female    DOB: Mar 31, 1956  Age: 68 y.o. MRN: [id number]  Chief Complaint  Patient presents with   Weight Loss    HPI  She is here today to follow-up on weight management we were waiting until the new year to see what her insurance would cover and she did do her research.  They will cover Wegovy, Zepbound and Saxenda with a prior authorization.  She really wants to be able to get her BMI down so that she can have her knee surgery.  She did have the injections done recently for her knees and says that has been helpful so far.  She has also been experiencing a rash on her abdomen and upper chest where she had sprayed some old perfume.  She found an old tube of triamcinolone and says that she used it but needs a new tube.  She was also recently seen in urgent care for cough.  She was diagnosed with possible pneumonia and treated with Levaquin.  They did recommend that she have a repeat x-ray done in about 4 weeks.  Should she would like to get that done today if possible.    ROS    Objective:     BP 135/83   Pulse 86   Ht 5' (1.524 m)   Wt 247 lb (112 kg)   SpO2 99%   BMI 48.24 kg/m    Physical Exam Vitals and nursing note reviewed.  Constitutional:      Appearance: Normal appearance.  HENT:     Head: Normocephalic and atraumatic.  Eyes:     Conjunctiva/sclera: Conjunctivae normal.  Cardiovascular:     Rate and Rhythm: Normal rate and regular rhythm.  Pulmonary:     Effort: Pulmonary effort is normal.     Breath sounds: Normal breath sounds.  Skin:    General: Skin is warm and dry.  Neurological:     Mental Status: She is alert.  Psychiatric:        Mood and Affect: Mood normal.      No results found for any visits on 12/02/23.    The 10-year ASCVD risk score (Arnett DK, et al., 2019) is: 12.2%    Assessment & Plan:   Problem List Items Addressed This Visit       Other    Encounter for weight management - Primary   Visit #: 1 Starting WUJWJX:914 lbs / BMI 48   Current weight: 247 lbs  Previous weight:  Change in weight: Goal weight:  BMI 40  Dietary goals: Work on cutting back on soda and sweetened beverages such as juice.  She really struggles with that.  Really encouraged her to work on a strategy to cut back and increase water intake with the ultimate goal of completely stopping drinking soda and sweetened beverages.  Discussed the importance of getting adequate protein in and reviewed some foods that are high in protein Exercise goals: Stay active.  She is somewhat limited because of her knee arthritis. Medication: Start 2.5 mg Zepbound. Follow-up and referrals: 7 to 8 weeks.       Relevant Medications   tirzepatide (ZEPBOUND) 2.5 MG/0.5ML injection vial   tirzepatide 5 MG/0.5ML injection vial (Start on 12/24/2023)   Other Visit Diagnoses       Cough, unspecified type       Relevant Orders   DG Chest 2 View     Rash  Relevant Medications   triamcinolone ointment (KENALOG) 0.5 %     Abnormal chest x-ray       Relevant Orders   DG Chest 2 View     BMI 45.0-49.9, adult (HCC)       Relevant Medications   tirzepatide (ZEPBOUND) 2.5 MG/0.5ML injection vial   tirzepatide 5 MG/0.5ML injection vial (Start on 12/24/2023)       Return in about 2 months (around 01/30/2024) for weight mgt.    Nani Gasser, MD

## 2023-12-11 ENCOUNTER — Encounter: Payer: Self-pay | Admitting: Family Medicine

## 2023-12-11 DIAGNOSIS — R9389 Abnormal findings on diagnostic imaging of other specified body structures: Secondary | ICD-10-CM

## 2023-12-11 DIAGNOSIS — R059 Cough, unspecified: Secondary | ICD-10-CM

## 2023-12-15 ENCOUNTER — Telehealth: Payer: Self-pay

## 2023-12-15 ENCOUNTER — Other Ambulatory Visit: Payer: Self-pay | Admitting: *Deleted

## 2023-12-15 NOTE — Telephone Encounter (Signed)
Prior auth for: ZEPBOUND 2.5 Determination: APPROVED Auth #: [id number] Valid from: 11/15/23 to 12/14/24 Patient notified via MyChart

## 2023-12-16 NOTE — Addendum Note (Signed)
Addended by: Nani Gasser D on: 12/16/2023 12:29 PM   Modules accepted: Orders

## 2023-12-17 MED ORDER — ONDANSETRON 4 MG PO TBDP: 4.0000 mg | ORAL_TABLET | Freq: Three times a day (TID) | ORAL | 0 refills | Status: DC | PRN

## 2023-12-17 NOTE — Progress Notes (Signed)
Meds ordered this encounter  Medications   triamcinolone ointment (KENALOG) 0.5 %    Sig: Apply 1 Application topically 2 (two) times daily.    Dispense:  30 g    Refill:  0   tirzepatide (ZEPBOUND) 2.5 MG/0.5ML injection vial    Sig: Inject 2.5 mg into the skin once a week.    Dispense:  2 mL    Refill:  0   tirzepatide 5 MG/0.5ML injection vial    Sig: Inject 5 mg into the skin once a week.    Dispense:  2 mL    Refill:  0   ondansetron (ZOFRAN-ODT) 4 MG disintegrating tablet    Sig: Take 1 tablet (4 mg total) by mouth every 8 (eight) hours as needed for nausea or vomiting.    Dispense:  20 tablet    Refill:  0

## 2023-12-17 NOTE — Addendum Note (Signed)
Addended by: Nani Gasser D on: 12/17/2023 01:06 PM   Modules accepted: Orders

## 2023-12-18 NOTE — Telephone Encounter (Signed)
Orders Placed This Encounter  Procedures   CT Chest Wo Contrast    Standing Status:   Future    Expiration Date:   12/17/2024    Preferred imaging location?:   [facility]

## 2023-12-21 ENCOUNTER — Ambulatory Visit: Payer: Medicare Other

## 2023-12-21 DIAGNOSIS — R059 Cough, unspecified: Secondary | ICD-10-CM

## 2023-12-21 DIAGNOSIS — R9389 Abnormal findings on diagnostic imaging of other specified body structures: Secondary | ICD-10-CM

## 2023-12-21 DIAGNOSIS — K449 Diaphragmatic hernia without obstruction or gangrene: Secondary | ICD-10-CM

## 2023-12-21 DIAGNOSIS — R918 Other nonspecific abnormal finding of lung field: Secondary | ICD-10-CM

## 2023-12-23 ENCOUNTER — Other Ambulatory Visit: Payer: TRICARE For Life (TFL)

## 2024-01-08 ENCOUNTER — Encounter: Payer: Self-pay | Admitting: Family Medicine

## 2024-01-08 NOTE — Progress Notes (Signed)
 Hi [name] you CT shows you have possible sarcoidosis but it is not clear. I would like to get you in with a pulmonologist to help tigure this out.  Do you have a preference for provider or location?

## 2024-01-11 ENCOUNTER — Telehealth: Payer: Self-pay

## 2024-01-11 DIAGNOSIS — R918 Other nonspecific abnormal finding of lung field: Secondary | ICD-10-CM

## 2024-01-11 DIAGNOSIS — R9389 Abnormal findings on diagnostic imaging of other specified body structures: Secondary | ICD-10-CM

## 2024-01-11 NOTE — Telephone Encounter (Unsigned)
 Copied from CRM [id number]. Topic: Clinical - Medical Advice >> Jan 11, 2024  8:16 AM Nila Nephew wrote: Reason for CRM: Patient returning call to [facility] regarding Ct results.

## 2024-01-12 NOTE — Telephone Encounter (Signed)
 My chart message sent to patient for to respond

## 2024-01-14 NOTE — Telephone Encounter (Signed)
 Referral sent

## 2024-01-22 ENCOUNTER — Ambulatory Visit
Admission: EM | Admit: 2024-01-22 | Discharge: 2024-01-22 | Disposition: A | Discharge status: Home / Self Care | Attending: Family Medicine | Admitting: Family Medicine

## 2024-01-22 DIAGNOSIS — J0100 Acute maxillary sinusitis, unspecified: Secondary | ICD-10-CM

## 2024-01-22 DIAGNOSIS — R059 Cough, unspecified: Secondary | ICD-10-CM

## 2024-01-22 MED ORDER — PREDNISONE 20 MG PO TABS: ORAL_TABLET | ORAL | 0 refills | Status: DC

## 2024-01-22 MED ORDER — DOXYCYCLINE HYCLATE 100 MG PO CAPS: 100.0000 mg | ORAL_CAPSULE | Freq: Two times a day (BID) | ORAL | 0 refills | Status: DC

## 2024-01-22 MED ORDER — BENZONATATE 200 MG PO CAPS: 200.0000 mg | ORAL_CAPSULE | Freq: Three times a day (TID) | ORAL | 0 refills | Status: AC | PRN

## 2024-01-22 NOTE — ED Triage Notes (Signed)
 Pt c/o cough and congestion x 3 days. Intermittent sharp pain in both ears. Denies fever. Taking zyrtec, delsym and flonase prn.

## 2024-01-22 NOTE — ED Provider Notes (Signed)
 [facility]    CSN: [id number] Arrival date & time: 01/22/24  1303      History   Chief Complaint Chief Complaint  Patient presents with   Cough   Nasal Congestion    HPI [name] is a 68 y.o. female.   HPI 68 year old female presents with cough and congestion for 3 days with intermittent sharp pain in both ears.  PMH significant for morbid obesity, colon cancer, and HTN.  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   Sebaceous cyst 11/02/2019   OSA (obstructive sleep apnea) 03/22/2018   Trochanteric bursitis of right hip 05/14/2017   Morbid obesity (HCC) 08/01/2016   IFG (impaired fasting glucose) 07/14/2016   Primary osteoarthritis of both hands 08/03/2015   Elevated LDL cholesterol level 09/16/2013   Trigger thumb of left hand 09/16/2013   Knee osteoarthritis 09/16/2013   IDA (iron deficiency anemia) 09/16/2013   Asthma 09/16/2013   Vitamin D deficiency 09/16/2013   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    Past Surgical History:  Procedure Laterality Date   CESAREAN SECTION     1982, 1988, 1989   CHOLECYSTECTOMY  1980   COLONOSCOPY     COLONOSCOPY WITH PROPOFOL N/A 10/02/2015   Procedure: COLONOSCOPY WITH PROPOFOL;  Surgeon: Meryl Dare, MD;  Location: [facility];  Service: Endoscopy;  Laterality: N/A;   laparoscopic sleeve gastrectomy N/A    Surgeon- Greig Right Teppara   PARTIAL COLECTOMY  2009   POLYPECTOMY     TUBAL LIGATION  1989    OB History     Gravida  3   Para  3   Term  3    Preterm      AB      Living         SAB      IAB      Ectopic      Multiple      Live Births  3            Home Medications    Prior to Admission medications   Medication Sig Start Date End Date Taking? Authorizing Provider  acetaminophen (TYLENOL) 500 MG tablet Take 500 mg by mouth every 6 (six) hours as needed.    [provider]  albuterol (PROVENTIL) (2.5 MG/3ML) 0.083% nebulizer solution Take 3 mLs (2.5 mg total) by nebulization every 4 (four) hours as needed for wheezing or shortness of breath. 10/09/23   Agapito Games, MD  albuterol (VENTOLIN HFA) 108 (90 Base) MCG/ACT inhaler Inhale 2 puffs into the lungs every 6 (six) hours as needed for wheezing or shortness of breath. 10/09/23   Agapito Games, MD  benzonatate (TESSALON) 200 MG capsule Take 1 capsule (200 mg total) by mouth 3 (three) times daily as needed for up to 7 days. 01/22/24 01/29/24 Yes Trevor Iha, FNP  cetirizine (ZYRTEC) 10 MG tablet Take 10 mg by mouth daily.  [provider]  doxycycline (VIBRAMYCIN) 100 MG capsule Take 1 capsule (100 mg total) by mouth 2 (two) times daily for 7 days. 01/22/24 01/29/24 Yes Trevor Iha, FNP  fluticasone (FLONASE) 50 MCG/ACT nasal spray Place 2 sprays into both nostrils daily. 10/09/23   Agapito Games, MD  Multiple Vitamin (MULTIVITAMIN) tablet Take 1 tablet by mouth daily.    [provider]  ondansetron (ZOFRAN-ODT) 4 MG disintegrating tablet Take 1 tablet (4 mg total) by mouth every 8 (eight) hours as needed for nausea or vomiting. 12/17/23   Agapito Games, MD  predniSONE (DELTASONE) 20 MG tablet Take 3 tabs PO daily x 5 days. 01/22/24  Yes Trevor Iha, FNP  tirzepatide (ZEPBOUND) 2.5 MG/0.5ML injection vial Inject 2.5 mg into the skin once a week. 12/02/23   Agapito Games, MD  tirzepatide 5 MG/0.5ML injection vial Inject 5 mg into the skin once a week. 12/24/23   Agapito Games, MD   triamcinolone ointment (KENALOG) 0.5 % Apply 1 Application topically 2 (two) times daily. 12/02/23   Agapito Games, MD    Family History Family History  Problem Relation Age of Onset   Hypertension Father    Heart attack Father    Stroke Paternal Grandmother    Hypertension Paternal Grandfather    Colon cancer Neg Hx    Stomach cancer Neg Hx    Esophageal cancer Neg Hx     Social History Social History   Tobacco Use   Smoking status: Never   Smokeless tobacco: Never  Vaping Use   Vaping status: Never Used  Substance Use Topics   Alcohol use: No    Alcohol/week: 0.0 standard drinks of alcohol   Drug use: No     Allergies   Augmentin [amoxicillin-pot clavulanate]   Review of Systems Review of Systems  HENT:  Positive for congestion, sinus pressure and sinus pain.   Respiratory:  Positive for cough.   All other systems reviewed and are negative.    Physical Exam Triage Vital Signs ED Triage Vitals  Encounter Vitals Group     BP      Systolic BP Percentile      Diastolic BP Percentile      Pulse      Resp      Temp      Temp src      SpO2      Weight      Height      Head Circumference      Peak Flow      Pain Score      Pain Loc      Pain Education      Exclude from Growth Chart    No data found.  Updated Vital Signs BP 126/83 (BP Location: Right Arm)   Pulse 96   Temp 98 F (36.7 C) (Oral)   Resp 17   Wt 240 lb 9.6 oz (109.1 kg)   SpO2 98%   BMI 46.99 kg/m      Physical Exam Vitals and nursing note reviewed.  Constitutional:      Appearance: Normal appearance. She is obese. She is ill-appearing.  HENT:     Head: Normocephalic and atraumatic.     Right Ear: Tympanic membrane and external ear normal.     Left Ear: Tympanic membrane and external ear normal.     Ears:     Comments: Significant eustachian tube dysfunction noted bilaterally    Nose:  Right Turbinates: Enlarged.     Left Turbinates: Enlarged.     Right  Sinus: Maxillary sinus tenderness present.     Left Sinus: Maxillary sinus tenderness present.     Comments: Turbinates are erythematous/edematous    Mouth/Throat:     Mouth: Mucous membranes are moist.     Pharynx: Oropharynx is clear.  Eyes:     Extraocular Movements: Extraocular movements intact.     Conjunctiva/sclera: Conjunctivae normal.     Pupils: Pupils are equal, round, and reactive to light.  Cardiovascular:     Rate and Rhythm: Normal rate and regular rhythm.     Pulses: Normal pulses.     Heart sounds: Normal heart sounds.  Pulmonary:     Effort: Pulmonary effort is normal.     Breath sounds: Normal breath sounds. No wheezing, rhonchi or rales.  Musculoskeletal:        General: Normal range of motion.     Cervical back: Normal range of motion and neck supple.  Skin:    General: Skin is warm and dry.  Neurological:     General: No focal deficit present.     Mental Status: She is alert and oriented to person, place, and time. Mental status is at baseline.  Psychiatric:        Mood and Affect: Mood normal.        Behavior: Behavior normal.      UC Treatments / Results  Labs (all labs ordered are listed, but only abnormal results are displayed) Labs Reviewed - No data to display  EKG   Radiology No results found.  Procedures Procedures (including critical care time)  Medications Ordered in UC Medications - No data to display  Initial Impression / Assessment and Plan / UC Course  I have reviewed the triage vital signs and the nursing notes.  Pertinent labs & imaging results that were available during my care of the patient were reviewed by me and considered in my medical decision making (see chart for details).     MDM: 1.  Acute nonrecurrent maxillary sinusitis-Rx'd doxycycline 100 mg capsule: Take 1 capsule twice daily x 7 days; 2.  Cough, unspecified type-Rx'd prednisone 20 mg tablet: Take 3 tabs p.o. daily x 5 days, Rx'd Tessalon 200 mg capsules:  Take 1 capsule 3 times daily, as needed for cough. Advised patient to take medications as directed with food to completion.  Advised patient to take prednisone with first dose of doxycycline for the next 5 of 7 days.  Advised may take Tessalon capsules daily or as needed for cough.  Encouraged to increase daily water intake to 64 ounces per day while taking these medications.  Advised if symptoms worsen and/or unresolved please follow-up with your PCP or here for further evaluation.  Final Clinical Impressions(s) / UC Diagnoses   Final diagnoses:  Acute non-recurrent maxillary sinusitis  Cough, unspecified type     Discharge Instructions      Advised patient to take medications as directed with food to completion.  Advised patient to take prednisone with first dose of doxycycline for the next 5 of 7 days.  Advised may take Tessalon capsules daily or as needed for cough.  Encouraged to increase daily water intake to 64 ounces per day while taking these medications.  Advised if symptoms worsen and/or unresolved please follow-up with your PCP or here for further evaluation.     ED Prescriptions     Medication Sig Dispense Auth. Provider   doxycycline (  VIBRAMYCIN) 100 MG capsule Take 1 capsule (100 mg total) by mouth 2 (two) times daily for 7 days. 14 capsule Trevor Iha, FNP   predniSONE (DELTASONE) 20 MG tablet Take 3 tabs PO daily x 5 days. 15 tablet Trevor Iha, FNP   benzonatate (TESSALON) 200 MG capsule Take 1 capsule (200 mg total) by mouth 3 (three) times daily as needed for up to 7 days. 40 capsule Trevor Iha, FNP      PDMP not reviewed this encounter.   Trevor Iha, FNP 01/22/24 [phone]

## 2024-01-22 NOTE — Discharge Instructions (Addendum)
 Advised patient to take medications as directed with food to completion.  Advised patient to take prednisone with first dose of doxycycline for the next 5 of 7 days.  Advised may take Tessalon capsules daily or as needed for cough.  Encouraged to increase daily water intake to 64 ounces per day while taking these medications.  Advised if symptoms worsen and/or unresolved please follow-up with your PCP or here for further evaluation.

## 2024-02-01 ENCOUNTER — Encounter: Payer: Self-pay | Admitting: Family Medicine

## 2024-02-01 ENCOUNTER — Ambulatory Visit (INDEPENDENT_AMBULATORY_CARE_PROVIDER_SITE_OTHER): Payer: TRICARE For Life (TFL) | Admitting: Family Medicine

## 2024-02-01 VITALS — BP 133/79 | HR 95 | Ht 60.0 in | Wt 240.0 lb

## 2024-02-01 DIAGNOSIS — Z713 Dietary counseling and surveillance: Secondary | ICD-10-CM | POA: Diagnosis not present

## 2024-02-01 DIAGNOSIS — Z7689 Persons encountering health services in other specified circumstances: Secondary | ICD-10-CM

## 2024-02-01 MED ORDER — ONDANSETRON 4 MG PO TBDP: 4.0000 mg | ORAL_TABLET | Freq: Three times a day (TID) | ORAL | 0 refills | Status: AC | PRN

## 2024-02-01 NOTE — Assessment & Plan Note (Signed)
 Visit #: 2 Starting WGNFAO:130 lbs / BMI 48    Current weight: 240 lbs  Previous weight: 247 lbs  Change in weight: down 7 lbs  Goal weight:  BMI 46.  Dietary goals: Work on cutting back on soda and sweetened beverages such as juice.  She really struggles with that.  Really encouraged her to work on a strategy to cut back and increase water intake with the ultimate goal of completely stopping drinking soda and sweetened beverages.  Discussed the importance of getting adequate protein in and reviewed some foods that are high in protein Exercise goals: Stay active.  She is somewhat limited because of her knee arthritis. Medication: Started 5 mg Zepbound this week. Follow-up and referrals: 7 - 8 weeks.

## 2024-02-01 NOTE — Addendum Note (Signed)
 Addended by: Nani Gasser D on: 02/01/2024 04:23 PM   Modules accepted: Orders

## 2024-02-01 NOTE — Progress Notes (Addendum)
   Established Patient Office Visit  Subjective  Patient ID: [name], female    DOB: Oct 21, 1956  Age: 68 y.o. MRN: [id number]  Chief Complaint  Patient presents with   Weight Check    HPI  F/U wt mgt - start 5mg  this week on the Zepbound.  So far tolerating it well with side effects.   Has f/u with Pulm in July about her Abnormal CXR.     ROS    Objective:     BP 133/79   Pulse 95   Ht 5' (1.524 m)   Wt 240 lb (108.9 kg)   SpO2 99%   BMI 46.87 kg/m    Physical Exam Vitals and nursing note reviewed.  Constitutional:      Appearance: Normal appearance.  HENT:     Head: Normocephalic and atraumatic.  Eyes:     Conjunctiva/sclera: Conjunctivae normal.  Cardiovascular:     Rate and Rhythm: Normal rate and regular rhythm.  Pulmonary:     Effort: Pulmonary effort is normal.     Breath sounds: Normal breath sounds.  Skin:    General: Skin is warm and dry.  Neurological:     Mental Status: She is alert.  Psychiatric:        Mood and Affect: Mood normal.      No results found for any visits on 02/01/24.    The 10-year ASCVD risk score (Arnett DK, et al., 2019) is: 11.8%    Assessment & Plan:   Problem List Items Addressed This Visit       Other   Encounter for weight management - Primary   Visit #: 2 Starting GEXBMW:413 lbs / BMI 48    Current weight: 240 lbs  Previous weight: 247 lbs  Change in weight: down 7 lbs  Goal weight:  BMI 46.  Dietary goals: Work on cutting back on soda and sweetened beverages such as juice.  She really struggles with that.  Really encouraged her to work on a strategy to cut back and increase water intake with the ultimate goal of completely stopping drinking soda and sweetened beverages.  Discussed the importance of getting adequate protein in and reviewed some foods that are high in protein Exercise goals: Stay active.  She is somewhat limited because of her knee arthritis. Medication: Started 5 mg Zepbound this  week. Follow-up and referrals: 7 - 8 weeks.         Encouraged her to call if ready to go up on her dose for next month. O/w f/u in 7-8 weeks.    Return in about 7 weeks (around 03/21/2024) for Wt Mgt.    Nani Gasser, MD

## 2024-02-19 ENCOUNTER — Encounter: Payer: Self-pay | Admitting: Family Medicine

## 2024-02-22 ENCOUNTER — Other Ambulatory Visit: Payer: Self-pay | Admitting: *Deleted

## 2024-02-22 ENCOUNTER — Other Ambulatory Visit (HOSPITAL_COMMUNITY): Payer: Self-pay

## 2024-02-22 ENCOUNTER — Telehealth: Payer: Self-pay

## 2024-02-22 DIAGNOSIS — Z7689 Persons encountering health services in other specified circumstances: Secondary | ICD-10-CM

## 2024-02-22 MED ORDER — TIRZEPATIDE-WEIGHT MANAGEMENT 5 MG/0.5ML ~~LOC~~ SOLN: 5.0000 mg | SUBCUTANEOUS | 0 refills | Status: DC

## 2024-02-22 NOTE — Telephone Encounter (Signed)
 Ozempic [name] is approved exclusively as an adjunct to diet and exercise to improve glycemic control in adults with type 2 diabetes mellitus. A review of patient's medical chart reveals no documented diagnosis of type 2 diabetes or an A1C indicative of diabetes. Therefore, they do not currently meet the criteria for prior authorization of this medication. If clinically appropriate, alternative options such as Saxenda , Zepbound, or Wegovy  may be considered for this patient. ICD10 code should have chart notes to support. Cover my meds UEA:[id number]

## 2024-02-24 NOTE — Telephone Encounter (Signed)
 I am confused.  She is on the Zepbound which is the weight loss version and the diagnosis code is weight management.  She has prediabetes but not diabetes.  Did we try to do a prior Auth for Zepbound?

## 2024-02-24 NOTE — Telephone Encounter (Signed)
 Pharmacy Patient Advocate Encounter  Received notification from [facility] that Prior Authorization for Mounjaro has been DENIED.  No reason given; No denial letter received via Fax or CMM. It has been requested and will be uploaded to the media tab once received.   PA #/Case ID/Reference #: [id number]

## 2024-02-25 ENCOUNTER — Other Ambulatory Visit (HOSPITAL_COMMUNITY): Payer: Self-pay

## 2024-02-25 ENCOUNTER — Telehealth: Payer: Self-pay

## 2024-02-25 NOTE — Telephone Encounter (Addendum)
 Pharmacy Patient Advocate Encounter   Received notification from Pt Calls Messages that prior authorization for Zepbound 5 is required/requested.   Insurance verification completed.   The patient is insured through [employer] .   Per test claim: PA required; PA submitted to above mentioned insurance via CoverMyMeds Key/confirmation #/[id number] Status is pending

## 2024-02-25 NOTE — Telephone Encounter (Signed)
 Ran a test claim for the pen injectors and its covered.

## 2024-02-25 NOTE — Telephone Encounter (Signed)
  This current rx is for vial and patient has approval already for zepbound pen injectors.

## 2024-02-26 ENCOUNTER — Other Ambulatory Visit: Payer: Self-pay | Admitting: Family Medicine

## 2024-02-26 DIAGNOSIS — Z7689 Persons encountering health services in other specified circumstances: Secondary | ICD-10-CM

## 2024-02-26 MED ORDER — ZEPBOUND 5 MG/0.5ML ~~LOC~~ SOAJ: 5.0000 mg | SUBCUTANEOUS | 0 refills | Status: DC

## 2024-02-26 NOTE — Telephone Encounter (Signed)
 Called pt to let her know that medication was sent to [facility] today by provider. Will route to PCP that was sent to wrong pharmacy.   Copied from CRM [id number]. Topic: Clinical - Prescription Issue >> Feb 26, 2024 11:34 AM Hamdi H wrote: Reason for CRM: Patient called in stating that she can't get her tirzepatide 5 MG/0.5ML injection vial because the pharmacy told her that the prescription was sent as mounjaro instead of zepbound or tirzepatide. Patients insurance will not cover mounjaro. Patient is saying she needs this issue resolved before end of day today and she doesn't want a mychart message sent to her, she wants a nurse to call her back on her cell phone and leave a detailed message if she doesn't answer. >> Feb 26, 2024  5:37 PM Corin V wrote: Patient called in and was upset that [facility] told her they would not be delivering the prescription until 5/6. She is due tomorrow to get her next injection and is asking that it be sent to [id number] - [place], [place] - [address] AT [facility] [address]  before the end of the day. She received another call partway through the conversation and asked that this be taken care of and hung up.

## 2024-02-26 NOTE — Telephone Encounter (Signed)
 Spoke tech at [facility], states that they need a precription for the zepboud. States that they tried to run it and it was denied by Dr. Greer Leak.   Copied from CRM [id number]. Topic: Clinical - Prescription Issue >> Feb 26, 2024 11:34 AM [name] wrote: Reason for CRM: Patient called in stating that she can't get her tirzepatide 5 MG/0.5ML injection vial because the pharmacy told her that the prescription was sent as mounjaro instead of zepbound or tirzepatide. Patients insurance will not cover mounjaro. Patient is saying she needs this issue resolved before end of day today and she doesn't want a mychart message sent to her, she wants a nurse to call her back on her cell phone and leave a detailed message if she doesn't answer.

## 2024-02-27 ENCOUNTER — Other Ambulatory Visit (HOSPITAL_COMMUNITY): Payer: Self-pay

## 2024-02-29 ENCOUNTER — Telehealth: Payer: Self-pay

## 2024-02-29 ENCOUNTER — Other Ambulatory Visit (HOSPITAL_COMMUNITY): Payer: Self-pay

## 2024-02-29 NOTE — Telephone Encounter (Signed)
 It was sent to [facility].

## 2024-02-29 NOTE — Telephone Encounter (Signed)

## 2024-03-01 ENCOUNTER — Ambulatory Visit: Payer: Self-pay

## 2024-03-01 ENCOUNTER — Other Ambulatory Visit: Payer: Self-pay

## 2024-03-01 MED ORDER — ZEPBOUND 5 MG/0.5ML ~~LOC~~ SOAJ: 5.0000 mg | SUBCUTANEOUS | 0 refills | Status: DC

## 2024-03-01 NOTE — Telephone Encounter (Signed)
 Duplicate message. Caller was transferred to CAL 9:58am by specialist.    Copied from CRM [id number]. Topic: Clinical - Prescription Issue >> Feb 26, 2024 11:34 AM [name] wrote: Reason for CRM: Patient called in stating that she can't get her tirzepatide 5 MG/0.5ML injection vial because the pharmacy told her that the prescription was sent as mounjaro instead of zepbound or tirzepatide. Patients insurance will not cover mounjaro. Patient is saying she needs this issue resolved before end of day today and she doesn't want a mychart message sent to her, she wants a nurse to call her back on her cell phone and leave a detailed message if she doesn't answer. >> Mar 01, 2024  9:58 AM [name] wrote: Patient calling in on update for medicaiton due to not getting sent to right pharmacy or to the pharmacy. Request to sepak to office . Called and transferred.   >> Feb 26, 2024  5:37 PM [name] wrote: Patient called in and was upset that [facility] her they would not be delivering the prescription until 5/6. She is due tomorrow to get her next injection and is asking that it be sent to [id number] - [place], [place] - [address] AT [facility] [address]  before the end of the day. She received another call partway through the conversation and asked that this be taken care of and hung up. Reason for Disposition  Caller has already spoken with the PCP and has no further questions.  Protocols used: No Contact or Duplicate Contact Call-A-AH

## 2024-03-01 NOTE — Telephone Encounter (Signed)
 Pt. [name] calling back regarding the status of her medication:    tirzepatide (ZEPBOUND) 5 MG/0.5ML Pen

## 2024-03-02 NOTE — Telephone Encounter (Signed)
 I called and confirmed the pharmacy does have the prescription. They do not have the medication in stock and had to order the medication. It should be there by tomorrow.

## 2024-03-04 ENCOUNTER — Telehealth: Payer: Self-pay

## 2024-03-04 NOTE — Telephone Encounter (Signed)
 Patient called - very upset - states she went to [facility] to get zepbound  prescription  and was told that they could not fill it as it was [facility] on 02/26/2024 She did not want to  use [facility] and had called earlier to have the [facility] stopped at [facility] and resent to correct pharmacy [facility] but this was not done.   [facility]ed [facility] at [phone] spoke with representative - states that they will issue a stop on this order but will take 72 hours to process. Asked if this could be done less than 72 hours as patient is needing her medication for this weekend and was told that it could not - the only thing we can do is call on Monday to see the status at that time.   Called patient left a detailed voice mail message as above on listed home # -verbal authorization to do this was given by patient.  Patient asked that we also inform Dr. Greer Leak of this as well.

## 2024-03-09 ENCOUNTER — Ambulatory Visit
Admission: EM | Admit: 2024-03-09 | Discharge: 2024-03-09 | Disposition: A | Discharge status: Home / Self Care | Attending: Family Medicine | Admitting: Family Medicine

## 2024-03-09 DIAGNOSIS — M25562 Pain in left knee: Secondary | ICD-10-CM

## 2024-03-09 DIAGNOSIS — M170 Bilateral primary osteoarthritis of knee: Secondary | ICD-10-CM | POA: Diagnosis not present

## 2024-03-09 DIAGNOSIS — M25561 Pain in right knee: Secondary | ICD-10-CM

## 2024-03-09 MED ORDER — OXYCODONE-ACETAMINOPHEN 5-325 MG PO TABS: 1.0000 | ORAL_TABLET | Freq: Three times a day (TID) | ORAL | 0 refills | Status: AC | PRN

## 2024-03-09 MED ORDER — PREDNISONE 10 MG (21) PO TBPK: ORAL_TABLET | Freq: Every day | ORAL | 0 refills | Status: DC

## 2024-03-09 NOTE — ED Provider Notes (Signed)
 [facility]    CSN: [id number] Arrival date & time: 03/09/24  1315      History   Chief Complaint Chief Complaint  Patient presents with   Knee Pain    Bilateral    HPI [name] is a 68 y.o. female.   HPI Pleasant 67 year old female presents with bilateral knee pain for 4-5 days.  Patient reports that she has used Epsom salt soaks as needed and has history of arthritis.  Reports that prednisone  and cyclobenzaprine usually works for the symptoms.  PMH significant for morbid obesity, HTN, and colon cancer.  Right knee x-ray from 06/28/2022 revealed severe tricompartmental osteoarthritic changes of the right knee most pronounced in the medial compartment, knee joint effusion noted with loose bodies in supra patella pouch  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   Sebaceous cyst 11/02/2019   OSA (obstructive sleep apnea) 03/22/2018   Trochanteric bursitis of right hip 05/14/2017   Morbid obesity (HCC) 08/01/2016   IFG (impaired fasting glucose) 07/14/2016   Primary osteoarthritis of both hands 08/03/2015   Elevated LDL cholesterol level 09/16/2013   Trigger thumb of left hand 09/16/2013   Knee osteoarthritis 09/16/2013   IDA (iron deficiency anemia) 09/16/2013   Asthma 09/16/2013   Vitamin D  deficiency 09/16/2013   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    Past Surgical History:  Procedure Laterality Date   CESAREAN SECTION     1982, 1988, 1989   CHOLECYSTECTOMY  1980   COLONOSCOPY     COLONOSCOPY WITH  PROPOFOL  N/A 10/02/2015   Procedure: COLONOSCOPY WITH PROPOFOL ;  Surgeon: Asencion Blacksmith, MD;  Location: [facility];  Service: Endoscopy;  Laterality: N/A;   laparoscopic sleeve gastrectomy N/A    Surgeon- Todd Fossa Teppara   PARTIAL COLECTOMY  2009   POLYPECTOMY     TUBAL LIGATION  1989    OB History     Gravida  3   Para  3   Term  3   Preterm      AB      Living         SAB      IAB      Ectopic      Multiple      Live Births  3            Home Medications    Prior to Admission medications   Medication Sig Start Date End Date Taking? Authorizing Provider  oxyCODONE-acetaminophen  (PERCOCET/ROXICET) 5-325 MG tablet Take 1 tablet by mouth every 8 (eight) hours as needed for up to 5 days for severe pain (pain score 7-10). 03/09/24 03/14/24 Yes Leonides Ramp, FNP  predniSONE  (STERAPRED UNI-PAK 21 TAB) 10 MG (21) TBPK tablet Take by mouth daily. Take 6 tabs by mouth daily  for 2 days, then 5 tabs for 2 days, then 4 tabs for 2 days, then 3 tabs for 2 days, 2 tabs for 2 days, then  1 tab by mouth daily for 2 days 03/09/24  Yes Leonides Ramp, FNP  acetaminophen  (TYLENOL ) 500 MG tablet Take 500 mg by mouth every 6 (six) hours as needed.    [provider]  albuterol  (PROVENTIL ) (2.5 MG/3ML) 0.083% nebulizer solution Take 3 mLs (2.5 mg total) by nebulization every 4 (four) hours as needed for wheezing or shortness of breath. 10/09/23   Cydney Draft, MD  albuterol  (VENTOLIN  HFA) 108 (90 Base) MCG/ACT inhaler Inhale 2 puffs into the lungs every 6 (six) hours as needed for wheezing or shortness of breath. 10/09/23   Cydney Draft, MD  cetirizine (ZYRTEC) 10 MG tablet Take 10 mg by mouth daily.     [provider]  fluticasone  (FLONASE ) 50 MCG/ACT nasal spray Place 2 sprays into both nostrils daily. 10/09/23   Cydney Draft, MD  Multiple Vitamin (MULTIVITAMIN) tablet Take 1 tablet by mouth daily.    [provider]  ondansetron   (ZOFRAN -ODT) 4 MG disintegrating tablet Take 1 tablet (4 mg total) by mouth every 8 (eight) hours as needed for nausea or vomiting. 02/01/24   Cydney Draft, MD  tirzepatide  (ZEPBOUND ) 5 MG/0.5ML Pen Inject 5 mg into the skin once a week. 03/01/24   Cydney Draft, MD  tirzepatide  5 MG/0.5ML injection vial Inject 5 mg into the skin once a week. 02/22/24   Cydney Draft, MD    Family History Family History  Problem Relation Age of Onset   Hypertension Father    Heart attack Father    Stroke Paternal Grandmother    Hypertension Paternal Grandfather    Colon cancer Neg Hx    Stomach cancer Neg Hx    Esophageal cancer Neg Hx     Social History Social History   Tobacco Use   Smoking status: Never   Smokeless tobacco: Never  Vaping Use   Vaping status: Never Used  Substance Use Topics   Alcohol use: No    Alcohol/week: 0.0 standard drinks of alcohol   Drug use: No     Allergies   Augmentin  [amoxicillin -pot clavulanate]   Review of Systems Review of Systems  Musculoskeletal:        Bilateral knee pain     Physical Exam Triage Vital Signs ED Triage Vitals  Encounter Vitals Group     BP      Systolic BP Percentile      Diastolic BP Percentile      Pulse      Resp      Temp      Temp src      SpO2      Weight      Height      Head Circumference      Peak Flow      Pain Score      Pain Loc      Pain Education      Exclude from Growth Chart    No data found.  Updated Vital Signs BP 118/79 (BP Location: Right Arm)   Pulse 86   Temp 97.7 F (36.5 C) (Oral)   Resp 17   SpO2 99%    Physical Exam Vitals and nursing note reviewed.  Constitutional:      Appearance: Normal appearance. She is obese.  HENT:     Head: Normocephalic and atraumatic.     Mouth/Throat:     Mouth: Mucous membranes are moist.     Pharynx: Oropharynx is clear.  Eyes:  Extraocular Movements: Extraocular movements intact.     Conjunctiva/sclera:  Conjunctivae normal.     Pupils: Pupils are equal, round, and reactive to light.  Cardiovascular:     Rate and Rhythm: Normal rate and regular rhythm.     Pulses: Normal pulses.     Heart sounds: Normal heart sounds.  Pulmonary:     Effort: Pulmonary effort is normal.     Breath sounds: Normal breath sounds. No wheezing, rhonchi or rales.  Musculoskeletal:        General: Normal range of motion.     Cervical back: Normal range of motion and neck supple.     Comments: Bilateral knees swollen and tender at inferior patellar surfaces, exam limited due to pain today  Skin:    General: Skin is warm and dry.  Neurological:     General: No focal deficit present.     Mental Status: She is alert and oriented to person, place, and time. Mental status is at baseline.  Psychiatric:        Mood and Affect: Mood normal.        Behavior: Behavior normal.      UC Treatments / Results  Labs (all labs ordered are listed, but only abnormal results are displayed) Labs Reviewed - No data to display  EKG   Radiology No results found.  Procedures Procedures (including critical care time)  Medications Ordered in UC Medications - No data to display  Initial Impression / Assessment and Plan / UC Course  I have reviewed the triage vital signs and the nursing notes.  Pertinent labs & imaging results that were available during my care of the patient were reviewed by me and considered in my medical decision making (see chart for details).     MDM: 1.  Acute pain of both knees-Rx'd Percocet 5/325 mg tablet: Take 1 tablet every 8 hours, as needed for severe/acute pain of bilateral knees; 2.  Primary osteoarthritis of both knees-Rx'd Sterapred Unipak 21 tab 10 mg taper. Advised patient to take medication as directed with food to completion.  Advised may use Percocet for acute/severe breakthrough knee pain.  Patient advised of sedative effects of this medication.  Encouraged to increase daily water  intake to 64 ounces per day while taking these medications.  Advised if symptoms worsen and/or unresolved please follow-up with your PCP, [facility], or here for further evaluation.  Patient discharged home, hemodynamically stable. Final Clinical Impressions(s) / UC Diagnoses   Final diagnoses:  Acute pain of both knees  Primary osteoarthritis of both knees     Discharge Instructions      Advised patient to take medication as directed with food to completion.  Advised may use Percocet for acute/severe breakthrough knee pain.  Patient advised of sedative effects of this medication.  Encouraged to increase daily water intake to 64 ounces per day while taking these medications.  Advised if symptoms worsen and/or unresolved please follow-up with your PCP, [facility], or here for further evaluation.     ED Prescriptions     Medication Sig Dispense Auth. Provider   predniSONE  (STERAPRED UNI-PAK 21 TAB) 10 MG (21) TBPK tablet Take by mouth daily. Take 6 tabs by mouth daily  for 2 days, then 5 tabs for 2 days, then 4 tabs for 2 days, then 3 tabs for 2 days, 2 tabs for 2 days, then 1 tab by mouth daily for 2 days 42 tablet Leonides Ramp, FNP   oxyCODONE-acetaminophen  (PERCOCET/ROXICET)  5-325 MG tablet Take 1 tablet by mouth every 8 (eight) hours as needed for up to 5 days for severe pain (pain score 7-10). 15 tablet Zayne Marovich, FNP      I have reviewed the PDMP during this encounter.   Leonides Ramp, FNP 03/09/24 1450

## 2024-03-09 NOTE — ED Triage Notes (Signed)
 Pt c/o bilateral knee for a few days. Epsom soaks prn. Hx of arthritis. Usually prednisone  and cyclobenzaprine helps. Hx of gastric sleeve so can't take NSAIDs.

## 2024-03-09 NOTE — Discharge Instructions (Addendum)
 Advised patient to take medication as directed with food to completion.  Advised may use Percocet for acute/severe breakthrough knee pain.  Patient advised of sedative effects of this medication.  Encouraged to increase daily water intake to 64 ounces per day while taking these medications.  Advised if symptoms worsen and/or unresolved please follow-up with your PCP, [facility], or here for further evaluation.

## 2024-03-22 ENCOUNTER — Encounter: Payer: Self-pay | Admitting: Family Medicine

## 2024-03-22 ENCOUNTER — Ambulatory Visit (INDEPENDENT_AMBULATORY_CARE_PROVIDER_SITE_OTHER): Admitting: Family Medicine

## 2024-03-22 VITALS — BP 126/79 | HR 97 | Ht 60.0 in | Wt 238.0 lb

## 2024-03-22 DIAGNOSIS — Z7689 Persons encountering health services in other specified circumstances: Secondary | ICD-10-CM

## 2024-03-22 DIAGNOSIS — Z713 Dietary counseling and surveillance: Secondary | ICD-10-CM | POA: Diagnosis not present

## 2024-03-22 MED ORDER — TIRZEPATIDE-WEIGHT MANAGEMENT 7.5 MG/0.5ML ~~LOC~~ SOAJ: 7.5000 mg | SUBCUTANEOUS | 1 refills | Status: DC

## 2024-03-22 NOTE — Assessment & Plan Note (Addendum)
 Visit #: 3 Starting Weight: 247 lbs / BMI 48    Current weight: 238 lbs  Previous weight: 240 lbs  Change in weight from last OV: down 2 lbs  Goal weight: 202 lbs/BMI under 40 Dietary goals: Work on cutting back on soda and sweetened beverages such as juice.  She really struggles with that.  Work on eating a protein breakfast. .  Discussed the importance of getting adequate protein in and reviewed some foods that are high in protein Exercise goals: Stay active.  She is somewhat limited because of her knee arthritis. Work on Event organiser.   Medication: Inc to 7.5 mg Zepbound  this week. Follow-up and referrals: 7 - 8 weeks  She has nausea meds at home.

## 2024-03-22 NOTE — Progress Notes (Signed)
   Established Patient Office Visit  Subjective  Patient ID: KIMMI ACOCELLA, female    DOB: Oct 28, 1956  Age: 68 y.o. MRN: [id number]  Chief Complaint  Patient presents with   Weight Check    HPI  Here for f/u wt WUJ:WJXB 2 lbs since here last time. On 5mg  of Zepbound .  Still struggling with cutting out Mtn Dew. Planning on switching to a carbonated flavored water. Still working towards her goal of knee surgery.    Has been on prednisone  for her knees.  Still on taper for flare with her knees.  Was having a hard time walking and wanted to be able to go to a family funeral.  Her brother-in-law died recently.     ROS    Objective:     BP 126/79   Pulse 97   Ht 5' (1.524 m)   Wt 238 lb 0.3 oz (108 kg)   SpO2 95%   BMI 46.49 kg/m    Physical Exam Vitals and nursing note reviewed.  Constitutional:      Appearance: Normal appearance.  HENT:     Head: Normocephalic and atraumatic.  Eyes:     Conjunctiva/sclera: Conjunctivae normal.  Cardiovascular:     Rate and Rhythm: Normal rate and regular rhythm.  Pulmonary:     Effort: Pulmonary effort is normal.     Breath sounds: Normal breath sounds.  Skin:    General: Skin is warm and dry.  Neurological:     Mental Status: She is alert.  Psychiatric:        Mood and Affect: Mood normal.      No results found for any visits on 03/22/24.    The 10-year ASCVD risk score (Arnett DK, et al., 2019) is: 10.5%    Assessment & Plan:   Problem List Items Addressed This Visit       Other   Encounter for weight management - Primary   Visit #: 3 Starting Weight: 247 lbs / BMI 48    Current weight: 238 lbs  Previous weight: 240 lbs  Change in weight from last OV: down 2 lbs  Goal weight: 202 lbs/BMI under 40 Dietary goals: Work on cutting back on soda and sweetened beverages such as juice.  She really struggles with that.  Work on eating a protein breakfast. .  Discussed the importance of getting adequate protein in and  reviewed some foods that are high in protein Exercise goals: Stay active.  She is somewhat limited because of her knee arthritis. Work on Event organiser.   Medication: Inc to 7.5 mg Zepbound  this week. Follow-up and referrals: 7 - 8 weeks  She has nausea meds at home.        Return in about 6 weeks (around 05/03/2024) for weight mgt .    Duaine German, MD

## 2024-04-11 ENCOUNTER — Ambulatory Visit: Payer: TRICARE For Life (TFL) | Admitting: Physician Assistant

## 2024-05-03 ENCOUNTER — Ambulatory Visit: Admitting: Family Medicine

## 2024-05-05 ENCOUNTER — Ambulatory Visit (HOSPITAL_BASED_OUTPATIENT_CLINIC_OR_DEPARTMENT_OTHER): Admitting: Pulmonary Disease

## 2024-05-19 ENCOUNTER — Ambulatory Visit: Admitting: Internal Medicine

## 2024-05-24 ENCOUNTER — Other Ambulatory Visit: Payer: Self-pay | Admitting: Family Medicine

## 2024-05-24 ENCOUNTER — Ambulatory Visit: Admitting: Family Medicine

## 2024-05-25 ENCOUNTER — Encounter: Payer: Self-pay | Admitting: Family Medicine

## 2024-05-25 DIAGNOSIS — Z7689 Persons encountering health services in other specified circumstances: Secondary | ICD-10-CM

## 2024-05-25 NOTE — Telephone Encounter (Signed)
 I can refill the 7.5 for another month or we can go up to the 10 mg.  See which she would prefer.  But she does need to schedule her follow-up.

## 2024-05-25 NOTE — Telephone Encounter (Signed)
 Please see patient's attached message.  States was late for appt scheduled yesterday and was told that she had to reschedule the visit - only next available  was September.  She  is requesting refill of Zepbound  -  Last filled as 7.5mg  03/22/2024 Last OV 03/22/2024 Upcoming appt = none

## 2024-05-26 MED ORDER — ZEPBOUND 10 MG/0.5ML ~~LOC~~ SOAJ
10.0000 mg | SUBCUTANEOUS | 0 refills | Status: DC
Start: 2024-05-26 — End: 2024-06-15

## 2024-05-30 ENCOUNTER — Encounter: Payer: Self-pay | Admitting: Family Medicine

## 2024-05-30 ENCOUNTER — Ambulatory Visit (INDEPENDENT_AMBULATORY_CARE_PROVIDER_SITE_OTHER): Admitting: Family Medicine

## 2024-05-30 VITALS — BP 140/82 | HR 93 | Ht 60.0 in | Wt 226.0 lb

## 2024-05-30 DIAGNOSIS — M25511 Pain in right shoulder: Secondary | ICD-10-CM | POA: Diagnosis not present

## 2024-05-30 DIAGNOSIS — Z7689 Persons encountering health services in other specified circumstances: Secondary | ICD-10-CM | POA: Diagnosis not present

## 2024-05-30 MED ORDER — PREDNISONE 20 MG PO TABS: 40.0000 mg | ORAL_TABLET | Freq: Every day | ORAL | 0 refills | Status: DC

## 2024-05-30 NOTE — Progress Notes (Signed)
 Established Patient Office Visit  Subjective  Patient ID: [name], female    DOB: Oct 21, 1956  Age: 68 y.o. MRN: [id number]  Chief Complaint  Patient presents with   Encounter for weight management     Patient will pick up prescription for zepbound  today - pharmacy is awaiting shipment.     HPI  Follow-up weight management-she is currently doing really well overall she has not had any major side effects or problems in fact we will try to get the 10 mg approved with her insurance.  It unfortunately got sent to [facility] but really needs to be sent to [facility] on [address].  But she is 12 pounds down from the last time she was here.  She has been trying to be more consistent with getting breakfast in.  She has also been battling some right shoulder pain for a few weeks.  She did have an old injury years ago when she grabbed a gurney with that right arm.  But nothing recent.  She says Tylenol  does help a little bit.  She has been using a topical rub on it as well.  It has been painful to sleep on that side and even just lifting her arm above 90 degrees some days is very difficult and painful she does feel like the Tylenol  did help she just does not really like youTylenol and because of her gastric sleeve history she tries to avoid NSAIDs.    ROS    Objective:     BP (!) 140/82   Pulse 93   Ht 5' (1.524 m)   Wt 226 lb (102.5 kg)   SpO2 98%   BMI 44.14 kg/m    Physical Exam Vitals and nursing note reviewed.  Constitutional:      Appearance: Normal appearance.  HENT:     Head: Normocephalic and atraumatic.  Eyes:     Conjunctiva/sclera: Conjunctivae normal.  Cardiovascular:     Rate and Rhythm: Normal rate and regular rhythm.  Pulmonary:     Effort: Pulmonary effort is normal.     Breath sounds: Normal breath sounds.  Musculoskeletal:     Comments: Right shoulder with normal range of motion though she has pain with extension above 90 degrees and pain with  reaching over and reaching over her back.  No significant tenderness directly over the shoulder joint.  Skin:    General: Skin is warm and dry.  Neurological:     Mental Status: She is alert.  Psychiatric:        Mood and Affect: Mood normal.      No results found for any visits on 05/30/24.    The 10-year ASCVD risk score (Arnett DK, et al., 2019) is: 13.1%    Assessment & Plan:   Problem List Items Addressed This Visit       Other   Encounter for weight management - Primary   Visit #: 4 Starting Weight: 247 lbs / BMI 48    Current weight: 226 lbs/BMI 44 Previous weight: 238 lbs  Change in weight from last OV: down 12 lbs  Goal weight: 202 lbs/BMI under 40 Dietary goals: Work on cutting back on soda and sweetened beverages such as juice.  She really struggles with that.  Work on eating a protein breakfast. .  Discussed the importance of getting adequate protein in and reviewed some foods that are high in protein Exercise goals: Stay active.  She is limited from knee arthritis. Work on  chair exercises.   Medication: Inc to 10 mg Zepbound  this week. Follow-up and referrals: 7 - 8 weeks   She has nausea meds at home.      Other Visit Diagnoses       Acute pain of right shoulder          Right shoulder pain-most consistent with bursitis-given handout with exercises to do on her own at home she cannot take NSAIDs and does not really want to take Tylenol  she does not like taking Tylenol .  Will treat with 5 days of prednisone .  Return in about 8 weeks (around 07/25/2024) for weight mgt .    Dorothyann Byars, MD

## 2024-05-30 NOTE — Assessment & Plan Note (Addendum)
 Visit #: 4 Starting Weight: 247 lbs / BMI 48    Current weight: 226 lbs/BMI 44 Previous weight: 238 lbs  Change in weight from last OV: down 12 lbs  Goal weight: 202 lbs/BMI under 40 Dietary goals: Work on cutting back on soda and sweetened beverages such as juice.  She really struggles with that.  Work on eating a protein breakfast. .  Discussed the importance of getting adequate protein in and reviewed some foods that are high in protein Exercise goals: Stay active.  She is limited from knee arthritis. Work on Event organiser.   Medication: Inc to 10 mg Zepbound  this week. Follow-up and referrals: 7 - 8 weeks   She has nausea meds at home.

## 2024-06-15 ENCOUNTER — Other Ambulatory Visit: Payer: Self-pay | Admitting: *Deleted

## 2024-06-15 ENCOUNTER — Other Ambulatory Visit (HOSPITAL_COMMUNITY): Payer: Self-pay

## 2024-06-15 ENCOUNTER — Other Ambulatory Visit: Payer: Self-pay | Admitting: Family Medicine

## 2024-06-15 DIAGNOSIS — Z7689 Persons encountering health services in other specified circumstances: Secondary | ICD-10-CM

## 2024-06-16 ENCOUNTER — Other Ambulatory Visit (HOSPITAL_COMMUNITY): Payer: Self-pay

## 2024-06-22 ENCOUNTER — Encounter: Payer: Self-pay | Admitting: Internal Medicine

## 2024-06-22 ENCOUNTER — Ambulatory Visit (INDEPENDENT_AMBULATORY_CARE_PROVIDER_SITE_OTHER): Admitting: Internal Medicine

## 2024-06-22 ENCOUNTER — Telehealth: Payer: Self-pay | Admitting: Internal Medicine

## 2024-06-22 VITALS — BP 124/84 | HR 99 | Temp 98.2°F | Ht 60.0 in | Wt 222.8 lb

## 2024-06-22 DIAGNOSIS — J4520 Mild intermittent asthma, uncomplicated: Secondary | ICD-10-CM

## 2024-06-22 DIAGNOSIS — D860 Sarcoidosis of lung: Secondary | ICD-10-CM

## 2024-06-22 NOTE — Patient Instructions (Addendum)
 It was a pleasure to see you today!  Please schedule follow up with myself in 2 months.  If my schedule is not open yet, we will contact you with a reminder closer to that time. Please call [phone] if you haven't heard from us  a month before, and always call us  sooner if issues or concerns arise. You can also send us  a message through MyChart, but but aware that this is not to be used for urgent issues and it may take up to 5-7 days to receive a reply. Please be aware that you will likely be able to view your results before I have a chance to respond to them. Please give us  5 business days to respond to any non-urgent results.    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 Your CT scan suggests sarcoidosis, and we need to confirm this with a biopsy via bronchoscopy. We will also conduct pulmonary function testing to assess your lung function and review your recent blood work to check for other organ involvement. Prednisone  is not recommended due to your weight loss goals and its side effects.  -ASTHMA, CHILDHOOD ONSET: Asthma is a condition where your airways narrow and swell, producing extra mucus, which can make breathing difficult. Your asthma is triggered by allergies and sinus infections, and you rarely use your inhaler, finding some relief when you do.  -SEASONAL ALLERGIC RHINITIS: Seasonal allergic rhinitis, commonly known as hay fever, causes cold-like symptoms due to allergens. You manage this with Zyrtec, which provides some relief, and your symptoms are less severe than in the past.

## 2024-06-22 NOTE — H&P (View-Only) (Signed)
 [name]    [id number]    1956-10-31  Primary Care Physician:Metheney, Dorothyann BIRCH, MD  Referring Physician: Alvan Dorothyann BIRCH, MD [address] [place],  [place] [address] Reason for Consultation: history of pneumonia and abnormal ct chest Date of Consultation: 06/22/2024  Chief complaint:   Chief Complaint  Patient presents with   Consult    History pneumonia    Asthma    Abnormal chest-x-ray and ct chest     HPI:  Discussed the use of AI scribe software for clinical note transcription with the patient, who gave verbal consent to proceed.  History of Present Illness [name] is a 68 year old female with recurrent pneumonia who presents for evaluation of abnormal CT scan findings. She was referred by Dr. Gregorio for evaluation of possible sarcoidosis after an abnormal CT scan.  She experienced pneumonia in March, which was treated with antibiotics as an outpatient. A follow-up chest x-ray was abnormal, leading to a CT scan that also showed abnormalities. She has a history of recurrent respiratory infections, including a significant upper respiratory infection before COVID-19, which took about a month to recover from. She experiences pneumonia or bronchitis approximately once a year, often following a bad cold.  She has a history of asthma, which flares up with sinus infections secondary to allergies. She takes Zyrtec for seasonal allergies, which helps manage her symptoms. She rarely uses her inhalers, only during illness or at night if she experiences a coughing fit. Her mother also had asthma.  She experiences shortness of breath, which she attributes partly to her knee issues and the effort required to move around her split-level house. Her shortness of breath is more noticeable when climbing stairs or walking long distances. She does not use her inhaler regularly but finds it helpful when she does.  She underwent a gastric sleeve  procedure in 2019 or 2020, resulting in significant weight loss of about 130 pounds. She is currently on a weight loss journey to meet the requirements for knee replacement surgery, aiming to reduce her weight to under 200 pounds. She is currently at 222 pounds and is taking Zepbound .  She has a history of sleep apnea, diagnosed during her pre-operative evaluation for gastric sleeve surgery, but she no longer uses CPAP following her weight loss. No current issues with sleep apnea.  No history of smoking or significant secondhand smoke exposure since childhood. 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 She lives with her husband and has three daughters. She has a dog and no animal allergies.   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  Exposures: lives at home with husband. Dogs.  Smoking history: never smoker, passive smoke exposure in childhood.   Social History   Occupational History   Occupation: Retired Charity fundraiser  Tobacco Use   Smoking status: Never    Passive exposure: Past   Smokeless tobacco: Never  Vaping Use   Vaping status: Never Used  Substance and Sexual Activity   Alcohol use: No    Alcohol/week: 0.0 standard drinks of alcohol   Drug use: No   Sexual activity: Not on file    Relevant family history:  Family History  Problem Relation Age of Onset   Asthma Mother    Hypertension Father    Heart attack Father    Lupus Sister    Stroke Paternal Grandmother    Hypertension Paternal Actor  Colon cancer Neg Hx    Stomach cancer Neg Hx    Esophageal cancer Neg Hx     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   Blood transfusion without reported diagnosis    1980's   Colon cancer (HCC) 2009   colon   GERD (gastroesophageal reflux disease)    Hiatal hernia    Hypertension     Past Surgical History:  Procedure Laterality  Date   CESAREAN SECTION     1982, 1988, 1989   CHOLECYSTECTOMY  1980   COLONOSCOPY     COLONOSCOPY WITH PROPOFOL  N/A 10/02/2015   Procedure: COLONOSCOPY WITH PROPOFOL ;  Surgeon: Gwendlyn ONEIDA Buddy, MD;  Location: [facility];  Service: Endoscopy;  Laterality: N/A;   laparoscopic sleeve gastrectomy N/A    Surgeon- Bethann Teppara   PARTIAL COLECTOMY  2009   POLYPECTOMY     TUBAL LIGATION  1989     Physical Exam: Blood pressure 124/84, pulse 99, temperature 98.2 F (36.8 C), temperature source Oral, height 5' (1.524 m), weight 222 lb 12.8 oz (101.1 kg), SpO2 100%. Gen:      No acute distress, obese ENT:  mild cobblestoning, no nasal polyps, mucus membranes moist Lungs:    No increased respiratory effort, symmetric chest wall excursion, clear to auscultation bilaterally, no wheezes or crackles CV:         Regular rate and rhythm; no murmurs, rubs, or gallops.  No pedal edema Abd:      + bowel sounds; soft, non-tender; no distension MSK: no acute synovitis of DIP or PIP joints, no mechanics hands.  Skin:      Warm and dry; no rashes Neuro: normal speech, no focal facial asymmetry Psych: alert and oriented x3, normal mood and affect   Data Reviewed/Medical Decision Making:  Independent interpretation of tests: Imaging:  Review of patient's CT Chest Feb 2025 images revealed mild tubular bronchiectasis, architectural distortion calcified hilar adenopathy c/w sarcoidosis. The patient's images have been independently reviewed by me.    PFTs:    Labs:  Lab Results  Component Value Date   NA 141 10/09/2023   K 4.4 10/09/2023   CO2 25 10/09/2023   GLUCOSE 96 10/09/2023   BUN 12 10/09/2023   CREATININE 0.95 10/09/2023   CALCIUM 9.4 10/09/2023   EGFR 66 10/09/2023   GFRNONAA 84 02/04/2021   Lab Results  Component Value Date   WBC 9.5 10/09/2023   HGB 14.8 10/09/2023   HCT 48.5 (H) 10/09/2023   MCV 79 10/09/2023   PLT 295 10/09/2023   AP elevated dec 2024  Immunization  status:  Immunization History  Administered Date(s) Administered   Fluad Quad(high Dose 65+) 10/13/2022   Fluad Trivalent(High Dose 65+) 10/09/2023   Influenza,inj,Quad PF,6+ Mos 08/05/2017, 10/31/2019, 10/07/2021   Influenza,trivalent, recombinat, inj, PF 08/02/2012   Influenza-Unspecified 08/02/2012, 08/05/2017, 08/21/2018   PFIZER(Purple Top)SARS-COV-2 Vaccination 02/10/2020, 03/03/2020, 10/12/2020   PNEUMOCOCCAL CONJUGATE-20 02/09/2023   Pneumococcal Polysaccharide-23 06/30/2018, 08/21/2018   Tdap 08/02/2014   Zoster Recombinant(Shingrix) 10/31/2019, 06/01/2020     I reviewed prior external note(s) from primary care  I reviewed the result(s) of the labs and imaging as noted above.   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 Mild shortness of breath noted. Likely mild disease  given age and symptoms. Biopsy via bronchoscopy needed for confirmation. Discussed sarcoidosis as a multisystem inflammatory disorder, granuloma formation, and treatment options. Prednisone  not recommended due to weight loss goals and side effects. - Schedule bronchoscopy for biopsy confirmation. - Order pulmonary function testing (PFT) to assess lung function. - Review recent blood work for other organ involvement. - Proceed with bronchoscopy now as per her preference. For the diagnosis of sarcoidosis I will obtain the following studies today:  Baseline eye examination to evaluate for ocular sarcoidosis - asymptomatic recommend at follow up Baseline serum creatinine for renal sarcoidosis - cr wnl Baseline serum alkaline phosphatase for hepatic sarcoidosis, mildly elevated with follow up due in september Baseline serum calcium - wnl dec 2024 Baseline serum complete blood count - wnl dec 2024 Baseline EKG for cardiac sarcoidosis - need to obtain  History of recurrent pneumonia Recurrent pneumonia with last episode in March  treated with outpatient antibiotics. No hospitalization. Episodes linked to upper respiratory infections and COVID-19, occurring approximately annually.  Asthma, childhood onset Childhood onset asthma triggered by allergies and sinus infections. Rare inhaler use with some relief. No significant adult symptoms.  Seasonal allergic rhinitis Managed with Zyrtec providing some relief. Symptoms less severe than in the past.   Reference: Diagnosis and Detection of Sarcoidosis: An Lobbyist Society Clinical Practice Guideline Am J Respir Crit Care Med Vol 201, Iss 8, pp e26-e51, Feb 16, 2019    Return to Care: Return in about 2 months (around 08/22/2024).  Verdon Gore, MD Pulmonary and Critical Care Medicine [facility] Office:[phone]  CC: Alvan Dorothyann BIRCH, *

## 2024-06-22 NOTE — Progress Notes (Signed)
 [name]    [id number]    1956-10-31  Primary Care Physician:Metheney, Dorothyann BIRCH, MD  Referring Physician: Alvan Dorothyann BIRCH, MD [address] [place],  [place] [address] Reason for Consultation: history of pneumonia and abnormal ct chest Date of Consultation: 06/22/2024  Chief complaint:   Chief Complaint  Patient presents with   Consult    History pneumonia    Asthma    Abnormal chest-x-ray and ct chest     HPI:  Discussed the use of AI scribe software for clinical note transcription with the patient, who gave verbal consent to proceed.  History of Present Illness [name] is a 68 year old female with recurrent pneumonia who presents for evaluation of abnormal CT scan findings. She was referred by Dr. Gregorio for evaluation of possible sarcoidosis after an abnormal CT scan.  She experienced pneumonia in March, which was treated with antibiotics as an outpatient. A follow-up chest x-ray was abnormal, leading to a CT scan that also showed abnormalities. She has a history of recurrent respiratory infections, including a significant upper respiratory infection before COVID-19, which took about a month to recover from. She experiences pneumonia or bronchitis approximately once a year, often following a bad cold.  She has a history of asthma, which flares up with sinus infections secondary to allergies. She takes Zyrtec for seasonal allergies, which helps manage her symptoms. She rarely uses her inhalers, only during illness or at night if she experiences a coughing fit. Her mother also had asthma.  She experiences shortness of breath, which she attributes partly to her knee issues and the effort required to move around her split-level house. Her shortness of breath is more noticeable when climbing stairs or walking long distances. She does not use her inhaler regularly but finds it helpful when she does.  She underwent a gastric sleeve  procedure in 2019 or 2020, resulting in significant weight loss of about 130 pounds. She is currently on a weight loss journey to meet the requirements for knee replacement surgery, aiming to reduce her weight to under 200 pounds. She is currently at 222 pounds and is taking Zepbound .  She has a history of sleep apnea, diagnosed during her pre-operative evaluation for gastric sleeve surgery, but she no longer uses CPAP following her weight loss. No current issues with sleep apnea.  No history of smoking or significant secondhand smoke exposure since childhood. 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 She lives with her husband and has three daughters. She has a dog and no animal allergies.   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  Exposures: lives at home with husband. Dogs.  Smoking history: never smoker, passive smoke exposure in childhood.   Social History   Occupational History   Occupation: Retired Charity fundraiser  Tobacco Use   Smoking status: Never    Passive exposure: Past   Smokeless tobacco: Never  Vaping Use   Vaping status: Never Used  Substance and Sexual Activity   Alcohol use: No    Alcohol/week: 0.0 standard drinks of alcohol   Drug use: No   Sexual activity: Not on file    Relevant family history:  Family History  Problem Relation Age of Onset   Asthma Mother    Hypertension Father    Heart attack Father    Lupus Sister    Stroke Paternal Grandmother    Hypertension Paternal Actor  Colon cancer Neg Hx    Stomach cancer Neg Hx    Esophageal cancer Neg Hx     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   Blood transfusion without reported diagnosis    1980's   Colon cancer (HCC) 2009   colon   GERD (gastroesophageal reflux disease)    Hiatal hernia    Hypertension     Past Surgical History:  Procedure Laterality  Date   CESAREAN SECTION     1982, 1988, 1989   CHOLECYSTECTOMY  1980   COLONOSCOPY     COLONOSCOPY WITH PROPOFOL  N/A 10/02/2015   Procedure: COLONOSCOPY WITH PROPOFOL ;  Surgeon: Gwendlyn ONEIDA Buddy, MD;  Location: [facility];  Service: Endoscopy;  Laterality: N/A;   laparoscopic sleeve gastrectomy N/A    Surgeon- Bethann Teppara   PARTIAL COLECTOMY  2009   POLYPECTOMY     TUBAL LIGATION  1989     Physical Exam: Blood pressure 124/84, pulse 99, temperature 98.2 F (36.8 C), temperature source Oral, height 5' (1.524 m), weight 222 lb 12.8 oz (101.1 kg), SpO2 100%. Gen:      No acute distress, obese ENT:  mild cobblestoning, no nasal polyps, mucus membranes moist Lungs:    No increased respiratory effort, symmetric chest wall excursion, clear to auscultation bilaterally, no wheezes or crackles CV:         Regular rate and rhythm; no murmurs, rubs, or gallops.  No pedal edema Abd:      + bowel sounds; soft, non-tender; no distension MSK: no acute synovitis of DIP or PIP joints, no mechanics hands.  Skin:      Warm and dry; no rashes Neuro: normal speech, no focal facial asymmetry Psych: alert and oriented x3, normal mood and affect   Data Reviewed/Medical Decision Making:  Independent interpretation of tests: Imaging:  Review of patient's CT Chest Feb 2025 images revealed mild tubular bronchiectasis, architectural distortion calcified hilar adenopathy c/w sarcoidosis. The patient's images have been independently reviewed by me.    PFTs:    Labs:  Lab Results  Component Value Date   NA 141 10/09/2023   K 4.4 10/09/2023   CO2 25 10/09/2023   GLUCOSE 96 10/09/2023   BUN 12 10/09/2023   CREATININE 0.95 10/09/2023   CALCIUM 9.4 10/09/2023   EGFR 66 10/09/2023   GFRNONAA 84 02/04/2021   Lab Results  Component Value Date   WBC 9.5 10/09/2023   HGB 14.8 10/09/2023   HCT 48.5 (H) 10/09/2023   MCV 79 10/09/2023   PLT 295 10/09/2023   AP elevated dec 2024  Immunization  status:  Immunization History  Administered Date(s) Administered   Fluad Quad(high Dose 65+) 10/13/2022   Fluad Trivalent(High Dose 65+) 10/09/2023   Influenza,inj,Quad PF,6+ Mos 08/05/2017, 10/31/2019, 10/07/2021   Influenza,trivalent, recombinat, inj, PF 08/02/2012   Influenza-Unspecified 08/02/2012, 08/05/2017, 08/21/2018   PFIZER(Purple Top)SARS-COV-2 Vaccination 02/10/2020, 03/03/2020, 10/12/2020   PNEUMOCOCCAL CONJUGATE-20 02/09/2023   Pneumococcal Polysaccharide-23 06/30/2018, 08/21/2018   Tdap 08/02/2014   Zoster Recombinant(Shingrix) 10/31/2019, 06/01/2020     I reviewed prior external note(s) from primary care  I reviewed the result(s) of the labs and imaging as noted above.   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 Mild shortness of breath noted. Likely mild disease  given age and symptoms. Biopsy via bronchoscopy needed for confirmation. Discussed sarcoidosis as a multisystem inflammatory disorder, granuloma formation, and treatment options. Prednisone  not recommended due to weight loss goals and side effects. - Schedule bronchoscopy for biopsy confirmation. - Order pulmonary function testing (PFT) to assess lung function. - Review recent blood work for other organ involvement. - Proceed with bronchoscopy now as per her preference. For the diagnosis of sarcoidosis I will obtain the following studies today:  Baseline eye examination to evaluate for ocular sarcoidosis - asymptomatic recommend at follow up Baseline serum creatinine for renal sarcoidosis - cr wnl Baseline serum alkaline phosphatase for hepatic sarcoidosis, mildly elevated with follow up due in september Baseline serum calcium - wnl dec 2024 Baseline serum complete blood count - wnl dec 2024 Baseline EKG for cardiac sarcoidosis - need to obtain  History of recurrent pneumonia Recurrent pneumonia with last episode in March  treated with outpatient antibiotics. No hospitalization. Episodes linked to upper respiratory infections and COVID-19, occurring approximately annually.  Asthma, childhood onset Childhood onset asthma triggered by allergies and sinus infections. Rare inhaler use with some relief. No significant adult symptoms.  Seasonal allergic rhinitis Managed with Zyrtec providing some relief. Symptoms less severe than in the past.   Reference: Diagnosis and Detection of Sarcoidosis: An Lobbyist Society Clinical Practice Guideline Am J Respir Crit Care Med Vol 201, Iss 8, pp e26-e51, Feb 16, 2019    Return to Care: Return in about 2 months (around 08/22/2024).  Verdon Gore, MD Pulmonary and Critical Care Medicine [facility] Office:[phone]  CC: Alvan Dorothyann BIRCH, *

## 2024-06-22 NOTE — Telephone Encounter (Signed)
 Please schedule the following:  Provider performing procedure:Myca Perno S Cloteal Isaacson Diagnosis: pulmonary sarcoidosis Which side if for nodule / mass? N/a Procedure: bronchoscopy with BAL Has patient been spoken to by Provider and given informed consent? yes Anesthesia: yes Do you need Fluro? yes Duration of procedure: 30 Date: 9/2 Alternate Date: 9/3, 9/4, 9/5  Time: AM or PM Location: [facility] Does patient have OSA? yes DM? no Or Latex allergy? no Medication Restriction/ Anticoagulate/Antiplatelet: none Pre-op Labs Ordered:determined by Anesthesia Imaging request: none  (If, SuperDimension CT Chest, please have STAT courier sent to ENDO)

## 2024-06-22 NOTE — Telephone Encounter (Addendum)
 Patient has been scheduled on 07/05/24 at 8:15am. Patient is aware. Routing to [employer] for [employer].

## 2024-07-01 LAB — HM MAMMOGRAPHY

## 2024-07-05 ENCOUNTER — Encounter (HOSPITAL_COMMUNITY): Payer: Self-pay | Admitting: Internal Medicine

## 2024-07-05 ENCOUNTER — Telehealth: Payer: Self-pay | Admitting: Internal Medicine

## 2024-07-05 ENCOUNTER — Ambulatory Visit (HOSPITAL_COMMUNITY)

## 2024-07-05 ENCOUNTER — Ambulatory Visit (HOSPITAL_COMMUNITY): Payer: Self-pay

## 2024-07-05 ENCOUNTER — Ambulatory Visit (HOSPITAL_COMMUNITY)
Admission: RE | Admit: 2024-07-05 | Discharge: 2024-07-05 | Disposition: A | Discharge status: Home / Self Care | Source: Home / Self Care | Attending: Internal Medicine | Admitting: Internal Medicine

## 2024-07-05 ENCOUNTER — Other Ambulatory Visit: Payer: Self-pay

## 2024-07-05 ENCOUNTER — Encounter (HOSPITAL_COMMUNITY)
Admission: RE | Disposition: A | Discharge status: Home / Self Care | Payer: Self-pay | Source: Home / Self Care | Attending: Internal Medicine

## 2024-07-05 ENCOUNTER — Ambulatory Visit (HOSPITAL_BASED_OUTPATIENT_CLINIC_OR_DEPARTMENT_OTHER): Payer: Self-pay

## 2024-07-05 DIAGNOSIS — R0602 Shortness of breath: Secondary | ICD-10-CM | POA: Insufficient documentation

## 2024-07-05 DIAGNOSIS — M1990 Unspecified osteoarthritis, unspecified site: Secondary | ICD-10-CM | POA: Insufficient documentation

## 2024-07-05 DIAGNOSIS — D860 Sarcoidosis of lung: Secondary | ICD-10-CM | POA: Diagnosis not present

## 2024-07-05 DIAGNOSIS — Z9889 Other specified postprocedural states: Secondary | ICD-10-CM

## 2024-07-05 DIAGNOSIS — Z6841 Body Mass Index (BMI) 40.0 and over, adult: Secondary | ICD-10-CM | POA: Insufficient documentation

## 2024-07-05 DIAGNOSIS — J95811 Postprocedural pneumothorax: Secondary | ICD-10-CM

## 2024-07-05 DIAGNOSIS — Z8701 Personal history of pneumonia (recurrent): Secondary | ICD-10-CM | POA: Insufficient documentation

## 2024-07-05 DIAGNOSIS — I10 Essential (primary) hypertension: Secondary | ICD-10-CM | POA: Insufficient documentation

## 2024-07-05 DIAGNOSIS — K219 Gastro-esophageal reflux disease without esophagitis: Secondary | ICD-10-CM | POA: Insufficient documentation

## 2024-07-05 DIAGNOSIS — E66813 Obesity, class 3: Secondary | ICD-10-CM | POA: Insufficient documentation

## 2024-07-05 DIAGNOSIS — R9389 Abnormal findings on diagnostic imaging of other specified body structures: Secondary | ICD-10-CM | POA: Diagnosis not present

## 2024-07-05 DIAGNOSIS — K449 Diaphragmatic hernia without obstruction or gangrene: Secondary | ICD-10-CM | POA: Insufficient documentation

## 2024-07-05 DIAGNOSIS — G4733 Obstructive sleep apnea (adult) (pediatric): Secondary | ICD-10-CM | POA: Diagnosis not present

## 2024-07-05 DIAGNOSIS — Z9884 Bariatric surgery status: Secondary | ICD-10-CM | POA: Insufficient documentation

## 2024-07-05 DIAGNOSIS — J8410 Pulmonary fibrosis, unspecified: Secondary | ICD-10-CM | POA: Insufficient documentation

## 2024-07-05 DIAGNOSIS — R918 Other nonspecific abnormal finding of lung field: Secondary | ICD-10-CM

## 2024-07-05 DIAGNOSIS — J849 Interstitial pulmonary disease, unspecified: Secondary | ICD-10-CM | POA: Insufficient documentation

## 2024-07-05 DIAGNOSIS — G4730 Sleep apnea, unspecified: Secondary | ICD-10-CM | POA: Insufficient documentation

## 2024-07-05 HISTORY — PX: BRONCHIAL BIOPSY: SHX5109

## 2024-07-05 HISTORY — PX: VIDEO BRONCHOSCOPY: SHX5072

## 2024-07-05 HISTORY — PX: BRONCHIAL WASHINGS: SHX5105

## 2024-07-05 LAB — BODY FLUID CELL COUNT WITH DIFFERENTIAL
Eos, Fluid: 2 %
Lymphs, Fluid: 25 %
Monocyte-Macrophage-Serous Fluid: 65 % (ref 50–90)
Neutrophil Count, Fluid: 6 % (ref 0–25)
Other Cells, Fluid: 2 %
Total Nucleated Cell Count, Fluid: 21 uL (ref 0–1000)

## 2024-07-05 SURGERY — BRONCHOSCOPY, WITH FLUOROSCOPY
Anesthesia: General

## 2024-07-05 MED ORDER — CHLORHEXIDINE GLUCONATE 0.12 % MT SOLN
OROMUCOSAL | Status: AC
  Filled 2024-07-05: qty 15

## 2024-07-05 MED ORDER — PHENYLEPHRINE 80 MCG/ML (10ML) SYRINGE FOR IV PUSH (FOR BLOOD PRESSURE SUPPORT)
PREFILLED_SYRINGE | INTRAVENOUS | Status: DC | PRN
  Administered 2024-07-05: 160 ug via INTRAVENOUS
  Administered 2024-07-05: 80 ug via INTRAVENOUS
  Administered 2024-07-05: 160 ug via INTRAVENOUS

## 2024-07-05 MED ORDER — ROCURONIUM BROMIDE 10 MG/ML (PF) SYRINGE
PREFILLED_SYRINGE | INTRAVENOUS | Status: DC | PRN
  Administered 2024-07-05: 50 mg via INTRAVENOUS

## 2024-07-05 MED ORDER — PHENYLEPHRINE HCL-NACL 20-0.9 MG/250ML-% IV SOLN
INTRAVENOUS | Status: DC | PRN
  Administered 2024-07-05: 50 ug/min via INTRAVENOUS

## 2024-07-05 MED ORDER — FENTANYL CITRATE (PF) 250 MCG/5ML IJ SOLN
INTRAMUSCULAR | Status: DC | PRN
  Administered 2024-07-05 (×2): 50 ug via INTRAVENOUS

## 2024-07-05 MED ORDER — MIDAZOLAM HCL 2 MG/2ML IJ SOLN
INTRAMUSCULAR | Status: DC | PRN
  Administered 2024-07-05: 2 mg via INTRAVENOUS

## 2024-07-05 MED ORDER — MIDAZOLAM HCL 2 MG/2ML IJ SOLN
INTRAMUSCULAR | Status: AC
  Filled 2024-07-05: qty 2

## 2024-07-05 MED ORDER — PROPOFOL 1000 MG/100ML IV EMUL
INTRAVENOUS | Status: AC
  Filled 2024-07-05: qty 200

## 2024-07-05 MED ORDER — SUGAMMADEX SODIUM 200 MG/2ML IV SOLN
INTRAVENOUS | Status: DC | PRN
Start: 2024-07-05 — End: 2024-07-05
  Administered 2024-07-05: 200 mg via INTRAVENOUS

## 2024-07-05 MED ORDER — CHLORHEXIDINE GLUCONATE 0.12 % MT SOLN
15.0000 mL | Freq: Once | OROMUCOSAL | Status: AC
  Administered 2024-07-05: 15 mL via OROMUCOSAL

## 2024-07-05 MED ORDER — PROPOFOL 10 MG/ML IV BOLUS
INTRAVENOUS | Status: DC | PRN
  Administered 2024-07-05: 120 mg via INTRAVENOUS

## 2024-07-05 MED ORDER — DEXAMETHASONE SODIUM PHOSPHATE 10 MG/ML IJ SOLN
INTRAMUSCULAR | Status: DC | PRN
  Administered 2024-07-05: 5 mg via INTRAVENOUS

## 2024-07-05 MED ORDER — PROPOFOL 500 MG/50ML IV EMUL
INTRAVENOUS | Status: AC
  Filled 2024-07-05: qty 50

## 2024-07-05 MED ORDER — SODIUM CHLORIDE 0.9 % IV SOLN
INTRAVENOUS | Status: AC | PRN
  Administered 2024-07-05: 500 mL via INTRAVENOUS

## 2024-07-05 MED ORDER — ONDANSETRON HCL 4 MG/2ML IJ SOLN
INTRAMUSCULAR | Status: DC | PRN
  Administered 2024-07-05: 4 mg via INTRAVENOUS

## 2024-07-05 MED ORDER — LIDOCAINE 2% (20 MG/ML) 5 ML SYRINGE
INTRAMUSCULAR | Status: DC | PRN
  Administered 2024-07-05: 80 mg via INTRAVENOUS

## 2024-07-05 MED ORDER — PHENYLEPHRINE HCL-NACL 20-0.9 MG/250ML-% IV SOLN
INTRAVENOUS | Status: AC
Start: 2024-07-05 — End: 2024-07-05
  Filled 2024-07-05: qty 250

## 2024-07-05 MED ORDER — PROPOFOL 500 MG/50ML IV EMUL
INTRAVENOUS | Status: DC | PRN
  Administered 2024-07-05: 100 ug/kg/min via INTRAVENOUS

## 2024-07-05 MED ORDER — FENTANYL CITRATE (PF) 100 MCG/2ML IJ SOLN
INTRAMUSCULAR | Status: AC
Start: 2024-07-05 — End: 2024-07-05
  Filled 2024-07-05: qty 2

## 2024-07-05 NOTE — Progress Notes (Signed)
 Per dr meade, cxr looks good post bronchoscopy and can go home

## 2024-07-05 NOTE — Op Note (Signed)
 [facility] Cardiopulmonary Patient Name: [name] Pocedure Date: 07/05/2024 MRN: [id number] Attending MD: Verdon RAMAN. Meade MD, MD, [id number] Date of Birth: 24-Mar-1956 CSN: Finalized Age: 68 Admit Type: Outpatient Gender: Female Procedure:             Bronchoscopy Indications:           Diffuse parenchymal lung disease, Sarcoidosis Providers:             Verdon RAMAN. Meade MD, MD, Ozell Pouch, [place], Technician Referring MD:           Medicines:             See the Anesthesia note for documentation of the                         administered medications Complications:         No immediate complications Estimated Blood Loss:  Estimated blood loss was minimal. Procedure:             Pre-Anesthesia Assessment:                        - A History and Physical has been performed. The                         patient's medications, allergies and sensitivities                         have been reviewed.                        After obtaining informed consent, the bronchoscope was                         passed under direct vision. Throughout the procedure,                         the patient's blood pressure, pulse, and oxygen                         saturations were monitored continuously. the [id number]                         ([id number]) Olympus bronchoscope was introduced through                         the mouth, via the endotracheal tube (the patient was                         intubated for the procedure) and advanced to the right                         lung only. The procedure was accomplished without                         difficulty. Scope In: Scope Out: Findings:      The endotracheal tube is in good position. The visualized portion of the       trachea is of  normal caliber. The carina is sharp. The tracheobronchial       tree was examined to at least the first subsegmental level on the right       side. Bronchial mucosa  and anatomy are normal; there are no       endobronchial lesions, and no secretions. a BAL was performed in the       Right middle lobe medial subsegment. Transbronchial biopsies were       performed from the RML and various segments of the RLL and sent for       tissue culture and surgical pathology Impression:            - Diffuse parenchymal lung disease                        - The airway examination was normal.                        - BAL RML 90cc instilled, 30cc removed                        - 3 biopsies of RML sent for tissue culture                        6 biopsies of RLL sent for surgical pathology Moderate Sedation:      see anesthesia note Recommendation:        - Await test results. Procedure Code(s):     --- Professional ---                        [phone], Bronchoscopy, rigid or flexible, including                         fluoroscopic guidance, when performed; diagnostic,                         with cell washing, when performed (separate procedure) Diagnosis Code(s):     --- Professional ---                        R91.8, Other nonspecific abnormal finding of lung field CPT copyright 2022 American Medical Association. All rights reserved. The codes documented in this report are preliminary and upon coder review may  be revised to meet current compliance requirements. Dejanee Thibeaux S. Meade MD, MD 07/05/2024 9:40:48 AM This report has been signed electronically. Number of Addenda: 0

## 2024-07-05 NOTE — Anesthesia Procedure Notes (Signed)
 Procedure Name: Intubation Date/Time: 07/05/2024 8:48 AM  Performed by: Mannie Krystal LABOR, CRNAPre-anesthesia Checklist: Patient identified, Emergency Drugs available, Suction available, Patient being monitored and Timeout performed Patient Re-evaluated:Patient Re-evaluated prior to induction Oxygen Delivery Method: Circle system utilized Preoxygenation: Pre-oxygenation with 100% oxygen Induction Type: IV induction Ventilation: Mask ventilation without difficulty Laryngoscope Size: Mac and 3 Grade View: Grade I Tube type: Oral Tube size: 8.5 mm Number of attempts: 1 Airway Equipment and Method: Stylet Placement Confirmation: ETT inserted through vocal cords under direct vision, positive ETCO2, CO2 detector and breath sounds checked- equal and bilateral Secured at: 23 cm Tube secured with: Tape Dental Injury: Teeth and Oropharynx as per pre-operative assessment  Future Recommendations: Recommend- induction with short-acting agent, and alternative techniques readily available Comments: Intubated by Len KET under MD/DO and CRNA supervision

## 2024-07-05 NOTE — Interval H&P Note (Signed)
 Patient presents for bronchoscopy for evaluation for sarcoidosis. No contraindications to proceed with procedure.   Verdon Gore, MD Pulmonary and Critical Care Medicine [facility] 07/05/2024 7:34 AM Pager: see AMION  If no response to pager, please call critical care on call (see AMION) until 7pm After 7:00 pm call Elink

## 2024-07-05 NOTE — Progress Notes (Signed)
 CRITICAL RESULT PROVIDER NOTIFICATION  Test performed and critical result:  chest xray; small right apical pneumothorax   Date and time result received:  1100  Provider name/title: Meade  Date and time provider notified: 1100  Date and time provider responded: 1100  Provider response:Evaluate remotely

## 2024-07-05 NOTE — Anesthesia Preprocedure Evaluation (Addendum)
 Anesthesia Evaluation  Patient identified by MRN, date of birth, ID band Patient awake    Reviewed: Allergy & Precautions, NPO status , Patient's Chart, lab work & pertinent test results  Airway Mallampati: III  TM Distance: >3 FB Neck ROM: Full    Dental  (+) Teeth Intact, Dental Advisory Given   Pulmonary shortness of breath and with exertion, asthma , sleep apnea  Sarcoidosis   pneumonia in March, which was treated with antibiotics as an outpatient. A follow-up chest x-ray was abnormal, leading to a CT scan that also showed abnormalities. She has a history of recurrent respiratory infections, including a significant upper respiratory infection before COVID-19, which took about a month to recover from. She experiences pneumonia or bronchitis approximately once a year, often following a bad cold.   She has a history of asthma, which flares up with sinus infections secondary to allergies. She takes Zyrtec for seasonal allergies, which helps manage her symptoms. She rarely uses her inhalers, only during illness or at night if she experiences a coughing fit. Her mother also had asthma.      Pulmonary exam normal breath sounds clear to auscultation       Cardiovascular hypertension (126/90 preop), Normal cardiovascular exam Rhythm:Regular Rate:Normal     Neuro/Psych negative neurological ROS  negative psych ROS   GI/Hepatic Neg liver ROS, hiatal hernia,GERD  Controlled,,S/p gastric sleeve   Endo/Other    Class 3 obesity (BMI 44)  Renal/GU negative Renal ROS  negative genitourinary   Musculoskeletal  (+) Arthritis , Osteoarthritis,    Abdominal  (+) + obese  Peds  Hematology negative hematology ROS (+)   Anesthesia Other Findings Zepbound  LD: 8d  Reproductive/Obstetrics negative OB ROS                              Anesthesia Physical Anesthesia Plan  ASA: 3  Anesthesia Plan: General    Post-op Pain Management: Tylenol  PO (pre-op)*   Induction: Intravenous  PONV Risk Score and Plan: 3 and Ondansetron , Dexamethasone , Midazolam  and Treatment may vary due to age or medical condition  Airway Management Planned: Oral ETT  Additional Equipment: None  Intra-op Plan:   Post-operative Plan: Extubation in OR  Informed Consent: I have reviewed the patients History and Physical, chart, labs and discussed the procedure including the risks, benefits and alternatives for the proposed anesthesia with the patient or authorized representative who has indicated his/her understanding and acceptance.     Dental advisory given  Plan Discussed with: CRNA  Anesthesia Plan Comments:          Anesthesia Quick Evaluation

## 2024-07-05 NOTE — Anesthesia Postprocedure Evaluation (Signed)
 Anesthesia Post Note  Patient: [name]  Procedure(s) Performed: BRONCHOSCOPY, WITH FLUOROSCOPY BRONCHOSCOPY, WITH BIOPSY IRRIGATION, BRONCHUS     Patient location during evaluation: Endoscopy Anesthesia Type: General Level of consciousness: awake and alert Pain management: pain level controlled Vital Signs Assessment: post-procedure vital signs reviewed and stable Respiratory status: spontaneous breathing, nonlabored ventilation and respiratory function stable Cardiovascular status: blood pressure returned to baseline and stable Postop Assessment: no apparent nausea or vomiting Anesthetic complications: no   No notable events documented.  Last Vitals:  Vitals:   07/05/24 1000 07/05/24 1010  BP: 105/69 125/74  Pulse: 78 85  Resp: 13 (!) 24  Temp:    SpO2: 94% 93%    Last Pain:  Vitals:   07/05/24 1010  TempSrc:   PainSc: 0-No pain                 Elimelech Houseman,W. EDMOND

## 2024-07-05 NOTE — Transfer of Care (Signed)
 Immediate Anesthesia Transfer of Care Note  Patient: [name]  Procedure(s) Performed: BRONCHOSCOPY, WITH FLUOROSCOPY BRONCHOSCOPY, WITH BIOPSY IRRIGATION, BRONCHUS  Patient Location: PACU and Endoscopy Unit  Anesthesia Type:General  Level of Consciousness: awake and alert   Airway & Oxygen Therapy: Patient Spontanous Breathing and Patient connected to face mask oxygen  Post-op Assessment: Report given to RN and Post -op Vital signs reviewed and stable  Post vital signs: Reviewed and stable  Last Vitals:  Vitals Value Taken Time  BP 118/67 07/05/24 09:47  Temp    Pulse 78 07/05/24 09:49  Resp 22 07/05/24 09:49  SpO2 94 % 07/05/24 09:49  Vitals shown include unfiled device data.  Last Pain:  Vitals:   07/05/24 0802  TempSrc: Temporal  PainSc:          Complications: No notable events documented.

## 2024-07-05 NOTE — Discharge Instructions (Signed)
 I will contact you with the results of the biopsy probably at the end of the week once I have results. Based on the results of your breathing testing (PFT) and your biopsy, we can make some decisions together about whether or not we need to treat your possible Sarcoidosis.   After the procedure, expected to have hoarse voice, coughing up small amounts of bleed. Expected to have low grade fever, ok to take tylenol  for this.  Expect these to resolve in about 24 hours.    Seek emergency care if increasing amounts of blood - filling up multiple spoonfuls or a small cup full. Seek emergency care if sudden onset of shortness of breath, chest pain, inability to speak in full sentences.

## 2024-07-05 NOTE — Telephone Encounter (Signed)
 Called patient to notify her of trace apical ptx. She is asymptomatic. Will have her come into office on 9/4 for chest xray and gave her return to care precautions.

## 2024-07-07 ENCOUNTER — Ambulatory Visit (INDEPENDENT_AMBULATORY_CARE_PROVIDER_SITE_OTHER)

## 2024-07-07 ENCOUNTER — Encounter (HOSPITAL_COMMUNITY): Payer: Self-pay | Admitting: Internal Medicine

## 2024-07-07 ENCOUNTER — Telehealth: Payer: Self-pay | Admitting: Internal Medicine

## 2024-07-07 DIAGNOSIS — J95811 Postprocedural pneumothorax: Secondary | ICD-10-CM | POA: Diagnosis not present

## 2024-07-07 DIAGNOSIS — Z9889 Other specified postprocedural states: Secondary | ICD-10-CM

## 2024-07-07 LAB — CULTURE, BAL-QUANTITATIVE W GRAM STAIN
Culture: NO GROWTH
Gram Stain: NONE SEEN

## 2024-07-07 LAB — CYTOLOGY - NON PAP

## 2024-07-07 LAB — ACID FAST SMEAR (AFB, MYCOBACTERIA): Acid Fast Smear: NEGATIVE

## 2024-07-07 NOTE — Telephone Encounter (Signed)
 Reviewed chest xray from today -looks stable to may be slightly worse.  Patient is having shortness of breath at rest, she is taking albuterol  breathing treatments, these do relieve.  She is still coughing up some blood which she describes as tinge on her tissue.  Not quite as painful.  She is not having chest pain.  She does not have a pulse ox at home and cannot check her heart rate.  At this point given that she is having some symptoms that are persisting beyond the bronchoscopy I think we need to address her small right apical pneumothorax.  Given that she is not in emergent distress I discussed with her that this can probably wait until tomorrow morning.  Advised her to come to the emergency room for hemoptysis and shortness of breath following bronchoscopy.  She may need TXA nebulizer treatment.  While she arrived to the ED she may require aspiration of the pneumothorax versus chest tube placement per pulmonary.  I have given her strict return precautions sooner if she develops worsening hemoptysis, worsening chest pain at rest, or shortness of breath.

## 2024-07-08 ENCOUNTER — Emergency Department (HOSPITAL_COMMUNITY)

## 2024-07-08 ENCOUNTER — Encounter (HOSPITAL_COMMUNITY): Payer: Self-pay

## 2024-07-08 ENCOUNTER — Inpatient Hospital Stay (HOSPITAL_COMMUNITY)
Admission: EM | Admit: 2024-07-08 | Discharge: 2024-07-10 | DRG: 200 | Disposition: A | Discharge status: Home / Self Care | Attending: Internal Medicine | Admitting: Internal Medicine

## 2024-07-08 ENCOUNTER — Other Ambulatory Visit: Payer: Self-pay

## 2024-07-08 ENCOUNTER — Inpatient Hospital Stay (HOSPITAL_COMMUNITY)

## 2024-07-08 DIAGNOSIS — G4730 Sleep apnea, unspecified: Secondary | ICD-10-CM | POA: Diagnosis present

## 2024-07-08 DIAGNOSIS — R042 Hemoptysis: Secondary | ICD-10-CM | POA: Diagnosis present

## 2024-07-08 DIAGNOSIS — I10 Essential (primary) hypertension: Secondary | ICD-10-CM | POA: Diagnosis present

## 2024-07-08 DIAGNOSIS — J939 Pneumothorax, unspecified: Secondary | ICD-10-CM | POA: Diagnosis not present

## 2024-07-08 DIAGNOSIS — Z85038 Personal history of other malignant neoplasm of large intestine: Secondary | ICD-10-CM

## 2024-07-08 DIAGNOSIS — E66813 Obesity, class 3: Secondary | ICD-10-CM | POA: Diagnosis present

## 2024-07-08 DIAGNOSIS — Z6841 Body Mass Index (BMI) 40.0 and over, adult: Secondary | ICD-10-CM | POA: Diagnosis not present

## 2024-07-08 DIAGNOSIS — Z880 Allergy status to penicillin: Secondary | ICD-10-CM | POA: Diagnosis not present

## 2024-07-08 DIAGNOSIS — N289 Disorder of kidney and ureter, unspecified: Secondary | ICD-10-CM | POA: Diagnosis present

## 2024-07-08 DIAGNOSIS — Z8616 Personal history of COVID-19: Secondary | ICD-10-CM

## 2024-07-08 DIAGNOSIS — Z9049 Acquired absence of other specified parts of digestive tract: Secondary | ICD-10-CM

## 2024-07-08 DIAGNOSIS — Z8269 Family history of other diseases of the musculoskeletal system and connective tissue: Secondary | ICD-10-CM

## 2024-07-08 DIAGNOSIS — Z79899 Other long term (current) drug therapy: Secondary | ICD-10-CM | POA: Diagnosis not present

## 2024-07-08 DIAGNOSIS — Z823 Family history of stroke: Secondary | ICD-10-CM | POA: Diagnosis not present

## 2024-07-08 DIAGNOSIS — J95811 Postprocedural pneumothorax: Principal | ICD-10-CM | POA: Diagnosis present

## 2024-07-08 DIAGNOSIS — J45909 Unspecified asthma, uncomplicated: Secondary | ICD-10-CM | POA: Diagnosis present

## 2024-07-08 DIAGNOSIS — D860 Sarcoidosis of lung: Secondary | ICD-10-CM | POA: Diagnosis present

## 2024-07-08 DIAGNOSIS — Z8249 Family history of ischemic heart disease and other diseases of the circulatory system: Secondary | ICD-10-CM

## 2024-07-08 DIAGNOSIS — R718 Other abnormality of red blood cells: Secondary | ICD-10-CM | POA: Diagnosis present

## 2024-07-08 DIAGNOSIS — Z825 Family history of asthma and other chronic lower respiratory diseases: Secondary | ICD-10-CM

## 2024-07-08 DIAGNOSIS — Z7722 Contact with and (suspected) exposure to environmental tobacco smoke (acute) (chronic): Secondary | ICD-10-CM | POA: Diagnosis present

## 2024-07-08 DIAGNOSIS — K219 Gastro-esophageal reflux disease without esophagitis: Secondary | ICD-10-CM | POA: Diagnosis present

## 2024-07-08 DIAGNOSIS — Z8701 Personal history of pneumonia (recurrent): Secondary | ICD-10-CM | POA: Diagnosis not present

## 2024-07-08 LAB — COMPREHENSIVE METABOLIC PANEL WITH GFR
ALT: 11 U/L (ref 0–44)
AST: 20 U/L (ref 15–41)
Albumin: 3.3 g/dL — ABNORMAL LOW (ref 3.5–5.0)
Alkaline Phosphatase: 82 U/L (ref 38–126)
Anion gap: 11 (ref 5–15)
BUN: 9 mg/dL (ref 8–23)
CO2: 25 mmol/L (ref 22–32)
Calcium: 8.8 mg/dL — ABNORMAL LOW (ref 8.9–10.3)
Chloride: 104 mmol/L (ref 98–111)
Creatinine, Ser: 0.94 mg/dL (ref 0.44–1.00)
GFR, Estimated: >60 mL/min (ref >60)
Glucose, Bld: 93 mg/dL (ref 70–99)
Potassium: 3.5 mmol/L (ref 3.5–5.1)
Sodium: 140 mmol/L (ref 135–145)
Total Bilirubin: 1.5 mg/dL — ABNORMAL HIGH (ref 0.0–1.2)
Total Protein: 6.4 g/dL — ABNORMAL LOW (ref 6.5–8.1)

## 2024-07-08 LAB — I-STAT CHEM 8, ED
BUN: 9 mg/dL (ref 8–23)
Calcium, Ion: 1.09 mmol/L — ABNORMAL LOW (ref 1.15–1.40)
Chloride: 103 mmol/L (ref 98–111)
Creatinine, Ser: 0.9 mg/dL (ref 0.44–1.00)
Glucose, Bld: 93 mg/dL (ref 70–99)
HCT: 43 % (ref 36.0–46.0)
Hemoglobin: 14.6 g/dL (ref 12.0–15.0)
Potassium: 3.4 mmol/L — ABNORMAL LOW (ref 3.5–5.1)
Sodium: 140 mmol/L (ref 135–145)
TCO2: 26 mmol/L (ref 22–32)

## 2024-07-08 LAB — CBC
HCT: 43.6 % (ref 36.0–46.0)
Hemoglobin: 13.7 g/dL (ref 12.0–15.0)
MCH: 24.7 pg — ABNORMAL LOW (ref 26.0–34.0)
MCHC: 31.4 g/dL (ref 30.0–36.0)
MCV: 78.7 fL — ABNORMAL LOW (ref 80.0–100.0)
Platelets: 299 K/uL (ref 150–400)
RBC: 5.54 MIL/uL — ABNORMAL HIGH (ref 3.87–5.11)
RDW: 15.9 % — ABNORMAL HIGH (ref 11.5–15.5)
WBC: 9 K/uL (ref 4.0–10.5)
nRBC: 0 % (ref 0.0–0.2)

## 2024-07-08 LAB — HIV ANTIBODY (ROUTINE TESTING W REFLEX): HIV Screen 4th Generation wRfx: NONREACTIVE

## 2024-07-08 LAB — SURGICAL PATHOLOGY

## 2024-07-08 MED ORDER — KETOROLAC TROMETHAMINE 30 MG/ML IJ SOLN
15.0000 mg | Freq: Once | INTRAMUSCULAR | Status: AC
  Administered 2024-07-08: 15 mg via INTRAVENOUS
  Filled 2024-07-08: qty 1

## 2024-07-08 MED ORDER — METHOCARBAMOL 500 MG PO TABS
500.0000 mg | ORAL_TABLET | Freq: Three times a day (TID) | ORAL | Status: DC
  Administered 2024-07-08 – 2024-07-10 (×5): 500 mg via ORAL
  Filled 2024-07-08 (×7): qty 1

## 2024-07-08 MED ORDER — LIDOCAINE HCL (PF) 1 % IJ SOLN
INTRAMUSCULAR | Status: AC
  Filled 2024-07-08: qty 5

## 2024-07-08 MED ORDER — HYDROMORPHONE HCL 1 MG/ML IJ SOLN
0.5000 mg | INTRAMUSCULAR | Status: DC | PRN
  Administered 2024-07-08 – 2024-07-09 (×2): 0.5 mg via INTRAVENOUS
  Filled 2024-07-08 (×2): qty 0.5

## 2024-07-08 MED ORDER — GABAPENTIN 300 MG PO CAPS
300.0000 mg | ORAL_CAPSULE | Freq: Three times a day (TID) | ORAL | Status: DC
  Administered 2024-07-08 – 2024-07-10 (×5): 300 mg via ORAL
  Filled 2024-07-08 (×7): qty 1

## 2024-07-08 MED ORDER — ACETAMINOPHEN 500 MG PO TABS
1000.0000 mg | ORAL_TABLET | Freq: Three times a day (TID) | ORAL | Status: DC
  Administered 2024-07-08 – 2024-07-10 (×5): 1000 mg via ORAL
  Filled 2024-07-08 (×7): qty 2

## 2024-07-08 MED ORDER — IPRATROPIUM-ALBUTEROL 0.5-2.5 (3) MG/3ML IN SOLN: 3.0000 mL | Freq: Four times a day (QID) | RESPIRATORY_TRACT | Status: DC | PRN

## 2024-07-08 MED ORDER — LIDOCAINE-EPINEPHRINE (PF) 2 %-1:200000 IJ SOLN
10.0000 mL | Freq: Once | INTRAMUSCULAR | Status: DC
  Filled 2024-07-08: qty 20

## 2024-07-08 NOTE — Procedures (Signed)
 Insertion of Chest Tube Procedure Note  [name]  [id number]  25-May-1956  Date:07/08/24  Time:10:54 AM    Provider Performing: Verdon GORMAN Gore   Procedure: Chest Tube Insertion [id number])  Indication(s) Pneumothorax  Consent Risks of the procedure as well as the alternatives and risks of each were explained to the patient and/or caregiver.  Consent for the procedure was obtained and is signed in the bedside chart  Anesthesia Topical only with 1% lidocaine     Time Out Verified patient identification, verified procedure, site/side was marked, verified correct patient position, special equipment/implants available, medications/allergies/relevant history reviewed, required imaging and test results available.   Sterile Technique Maximal sterile technique including full sterile barrier drape, hand hygiene, sterile gown, sterile gloves, mask, hair covering, sterile ultrasound probe cover (if used).   Procedure Description Ultrasound not used to identify appropriate pleural anatomy for placement and overlying skin marked. Area of placement cleaned and draped in sterile fashion.  A 14 French pigtail pleural catheter was placed into the right pleural space using Seldinger technique. Appropriate return of air was obtained.  The tube was connected to atrium and placed on -20 cm H2O wall suction.   Complications/Tolerance None; patient tolerated the procedure well. Chest X-ray is ordered to verify placement.   EBL Minimal  Specimen(s) none

## 2024-07-08 NOTE — H&P (Addendum)
 History and Physical    Patient: [name] FMW:[id number] DOB: July 26, 1956 DOA: 07/08/2024 DOS: the patient was seen and examined on 07/08/2024 PCP: Alvan Dorothyann BIRCH, MD  Patient coming from: Home  Chief Complaint:  Chief Complaint  Patient presents with   Pneumothorax    HPI: [name] is a 68 y.o. female with medical history significant of HTN and colon cancer p/w SOB/DOE and found to have R PTX after bronchoscopy to eval for sarcoidosis.  The patient presented with a breathing problem that started after a bronchoscopy procedure on Tuesday, September 2nd, which was performed to investigate suspected sarcoidosis. Prior to the procedure, the patient had a history of pneumonia and had undergone a chest x-ray and a CT scan, which led to the suspicion of sarcoidosis by the pulmonologist. Following the bronchoscopy, the patient experienced increased shortness of breath, particularly noticeable after walking down the hall, accompanied by noisy breathing described as crowing. The pulmonologist identified a small pneumothorax post-procedure and advised a follow-up chest x-ray. Upon returning on Thursday, the chest x-ray indicated that the pneumothorax had enlarged, prompting the patient's hospital visit due to feeling unwell.  In the ED, pt tachypneic on 4L [place]. Labs notable for K 3.4. CXR showed Continued increase in size of the large right pneumothorax, now measuring 4.4 cm at the right lung apex (previously, 3.9 cm). No significant mediastinal shift to suggest tension. EDP consulted CCM which placed chest tube, and pt admitted to medicine for ongoing care.  Review of Systems: As mentioned in the history of present illness. All other systems reviewed and are negative. 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   Blood transfusion without reported diagnosis    1980's   Colon cancer  (HCC) 2009   colon   GERD (gastroesophageal reflux disease)    Hiatal hernia    Hypertension    Past Surgical History:  Procedure Laterality Date   BRONCHIAL BIOPSY  07/05/2024   Procedure: BRONCHOSCOPY, WITH BIOPSY;  Surgeon: Meade Verdon GORMAN, MD;  Location: [facility] ENDOSCOPY;  Service: Pulmonary;;   BRONCHIAL WASHINGS  07/05/2024   Procedure: IRRIGATION, BRONCHUS;  Surgeon: Meade Verdon GORMAN, MD;  Location: [facility] ENDOSCOPY;  Service: Pulmonary;;   CESAREAN SECTION     1982, 1988, 1989   CHOLECYSTECTOMY  1980   COLONOSCOPY     COLONOSCOPY WITH PROPOFOL  N/A 10/02/2015   Procedure: COLONOSCOPY WITH PROPOFOL ;  Surgeon: Gwendlyn ONEIDA Buddy, MD;  Location: [facility] ENDOSCOPY;  Service: Endoscopy;  Laterality: N/A;   laparoscopic sleeve gastrectomy N/A    Surgeon- Bethann Teppara   PARTIAL COLECTOMY  2009   POLYPECTOMY     TUBAL LIGATION  1989   VIDEO BRONCHOSCOPY N/A 07/05/2024   Procedure: BRONCHOSCOPY, WITH FLUOROSCOPY;  Surgeon: Meade Verdon GORMAN, MD;  Location: [facility] ENDOSCOPY;  Service: Pulmonary;  Laterality: N/A;  WITH BAL   Social History:  reports that she has never smoked. She has been exposed to tobacco smoke. She has never used smokeless tobacco. She reports that she does not drink alcohol and does not use drugs.  Allergies  Allergen Reactions   Augmentin  [Amoxicillin -Pot Clavulanate] Diarrhea    Family History  Problem Relation Age of Onset   Asthma Mother    Hypertension Father    Heart attack Father    Lupus Sister    Stroke Paternal Grandmother  Hypertension Paternal Grandfather    Colon cancer Neg Hx    Stomach cancer Neg Hx    Esophageal cancer Neg Hx     Prior to Admission medications   Medication Sig Start Date End Date Taking? Authorizing Provider  acetaminophen  (TYLENOL ) 500 MG tablet Take 500 mg by mouth every 6 (six) hours as needed.    [provider]  albuterol  (PROVENTIL ) (2.5 MG/3ML) 0.083% nebulizer solution Take 3 mLs (2.5 mg total) by nebulization every 4 (four)  hours as needed for wheezing or shortness of breath. 10/09/23   Alvan Dorothyann BIRCH, MD  albuterol  (VENTOLIN  HFA) 108 [phone] Base) MCG/ACT inhaler Inhale 2 puffs into the lungs every 6 (six) hours as needed for wheezing or shortness of breath. 10/09/23   Alvan Dorothyann BIRCH, MD  cetirizine (ZYRTEC) 10 MG tablet Take 10 mg by mouth daily.     [provider]  fluticasone  (FLONASE ) 50 MCG/ACT nasal spray Place 2 sprays into both nostrils daily. 10/09/23   Alvan Dorothyann BIRCH, MD  Multiple Vitamin (MULTIVITAMIN) tablet Take 1 tablet by mouth daily.    [provider]  ondansetron  (ZOFRAN -ODT) 4 MG disintegrating tablet Take 1 tablet (4 mg total) by mouth every 8 (eight) hours as needed for nausea or vomiting. 02/01/24   Alvan Dorothyann BIRCH, MD  ZEPBOUND  10 MG/0.5ML Pen INJECT 10 MG UNDER THE SKIN ONCE WEEKLY 06/15/24   Alvan Dorothyann BIRCH, MD    Physical Exam: Vitals:   07/08/24 0855 07/08/24 0900 07/08/24 0933 07/08/24 1045  BP:  121/71  138/76  Pulse: 88 87  82  Resp: 15 19  16   Temp:      SpO2: 97% 100%  98%  Weight:   100.7 kg   Height:   5' (1.524 m)    General: Alert, oriented x3, resting comfortably in no acute distress Respiratory: Diminished air exchange in RLL Cardiovascular: Regular rate and rhythm w/o m/r/g Abdomen: Soft, nontender, nondistended. Positive bowel sounds   Data Reviewed:  Lab Results  Component Value Date   WBC 9.0 07/08/2024   HGB 14.6 07/08/2024   HCT 43.0 07/08/2024   MCV 78.7 (L) 07/08/2024   PLT 299 07/08/2024   Lab Results  Component Value Date   GLUCOSE 93 07/08/2024   CALCIUM 8.8 (L) 07/08/2024   NA 140 07/08/2024   K 3.4 (L) 07/08/2024   CO2 25 07/08/2024   CL 103 07/08/2024   BUN 9 07/08/2024   CREATININE 0.90 07/08/2024   Lab Results  Component Value Date   ALT 11 07/08/2024   AST 20 07/08/2024   ALKPHOS 82 07/08/2024   BILITOT 1.5 (H) 07/08/2024   No results found for: INR, PROTIME Radiology: DG Chest  Portable 1 View Result Date: 07/08/2024 CLINICAL DATA:  ptx on op x-ray EXAM: PORTABLE CHEST - 1 VIEW COMPARISON:  July 07, 2024 FINDINGS: Diffuse bronchial wall thickening with patchy interstitial opacities throughout both lungs. Redemonstration of a moderate sized right pneumothorax measuring 4.4 cm at the lung apex (previously, 3.9 cm). No pleural effusion. The cardiac silhouette is at the upper limits of normal, likely accentuated by AP technique and low lung volumes. No acute fracture or destructive lesions. Multilevel thoracic osteophytosis. IMPRESSION: Continued increase in size of the large right pneumothorax, now measuring 4.4 cm at the right lung apex (previously, 3.9 cm). No significant mediastinal shift to suggest tension. These results will be called to the ordering clinician or representative by the Radiologist Assistant and communication documented in the PACS  or [employer]. Electronically Signed   By: Rogelia Myers M.D.   On: 07/08/2024 09:15    Assessment and Plan: 31F h/o HTN and colon cancer p/w SOB/DOE and found to have R PTX after bronchoscopy to eval for sarcoidosis.  R PTX -Consulted CCM; appreciate eval/recs regarding chest tube -Duobnebs prn -Multimodal pain control with PO tyelnol 1g TID, robaxin  500mg  TID, gabapentin  300mg  TID, and IV dilaudid  0.5mg  q4h prn -Continue incentive spirometry prn  Hemoptysis Reported to CCM -Consider TXA neb prn; will defer for now    Advance Care Planning:   Code Status: Full Code   Consults: CCM  Family Communication: Husband  Severity of Illness: The appropriate patient status for this patient is INPATIENT. Inpatient status is judged to be reasonable and necessary in order to provide the required intensity of service to ensure the patient's safety. The patient's presenting symptoms, physical exam findings, and initial radiographic and laboratory data in the context of their chronic comorbidities is felt to place them at  high risk for further clinical deterioration. Furthermore, it is not anticipated that the patient will be medically stable for discharge from the hospital within 2 midnights of admission.   * I certify that at the point of admission it is my clinical judgment that the patient will require inpatient hospital care spanning beyond 2 midnights from the point of admission due to high intensity of service, high risk for further deterioration and high frequency of surveillance required.*   ------- I spent 56 minutes reviewing previous notes, at the bedside counseling/discussing the treatment plan, and performing clinical documentation.  Author: Marsha Ada, MD 07/08/2024 11:12 AM  For on call review [email or web address].

## 2024-07-08 NOTE — ED Notes (Signed)
 X-ray at bedside.

## 2024-07-08 NOTE — ED Notes (Signed)
 RN on 6N notified of patient coming.

## 2024-07-08 NOTE — Consult Note (Signed)
 NAME:  [name], MRN:  [id number], DOB:  Jul 12, 1956, LOS: 0 ADMISSION DATE:  07/08/2024, CONSULTATION DATE:  07/08/2024 REFERRING MD:  Yolande Lamar BROCKS, MD, CHIEF COMPLAINT:  pneumothorax   History of Present Illness:  [name] is a 68 year old with past medical history of shortness of breath and abnormal CT chest with possible evaluation for sarcoidosis.  She had a bronchoscopy on September 2 with transbronchial biopsies.  Following the procedure she had a very small right apical pneumothorax which we are managing expectantly with observation and repeat imaging.  Fortunately she had worsening shortness of breath and chest discomfort.  She presents to the ED this morning with those symptoms.  Chest x-ray shows worsening and enlargement of the right apical pneumothorax.  PCCM consulted for evaluation of the pneumothorax.  She says that her daughter brought her over a pulse oximeter and she was dropping below 88% last night.  She is now on 2 L of oxygen.  Pertinent  Medical History  Seasonal allergic rhinitis Shortness of breath  Significant Hospital Events: Including procedures, antibiotic start and stop dates in addition to other pertinent events     Interim History / Subjective:    Objective    Blood pressure 121/71, pulse 87, temperature 97.7 F (36.5 C), resp. rate 19, height 5' (1.524 m), weight 100.7 kg, SpO2 100%.       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  This should be hooked up to suction. -Admit to observation with hospital medicine. -Chest x-ray is pending postprocedure -She should have a chest x-ray in the morning.  Pulmonary will follow with transition anticipated to waterseal.  I suspect she will be in the hospital at least until tomorrow possibly Sunday. - It sounds like her hemoptysis has slowed down significantly.  If she is having worsening hemoptysis can consider a trial of TXA neb. - For now continue albuterol  inhalers as needed  Verdon Gore, MD Pulmonary and Critical Care Medicine [facility] 07/08/2024 11:01 AM Pager: see AMION  If no response to pager, please call critical care on call (see AMION) until 7pm After 7:00 pm call Elink     Labs   CBC: Recent Labs  Lab 07/08/24 0852 07/08/24 0902  WBC 9.0  --   HGB 13.7 14.6  HCT 43.6 43.0  MCV 78.7*  --   PLT 299  --     Basic Metabolic Panel: Recent Labs  Lab 07/08/24 0852 07/08/24 0902  NA 140 140  K 3.5 3.4*  CL 104 103  CO2 25  --   GLUCOSE 93 93  BUN 9 9  CREATININE 0.94 0.90  CALCIUM 8.8*  --    GFR: Estimated Creatinine Clearance: 64.7 mL/min (by C-G formula based on SCr of 0.9 mg/dL). Recent Labs  Lab 07/08/24 0852  WBC 9.0    Liver Function Tests: Recent Labs  Lab 07/08/24 0852  AST 20  ALT 11  ALKPHOS 82  BILITOT 1.5*  PROT 6.4*  ALBUMIN 3.3*   No results for input(s): LIPASE, AMYLASE in the last 168 hours. No results for input(s): AMMONIA in the last 168 hours.  ABG    Component Value Date/Time   TCO2 26 07/08/2024 0902     Coagulation Profile: No results for input(s): INR, PROTIME in the last 168 hours.  Cardiac Enzymes: No results for input(s): CKTOTAL, CKMB, CKMBINDEX, TROPONINI in the last 168 hours.  HbA1C: Hemoglobin A1C  Date/Time Value Ref Range Status   12/18/2022 04:45 PM 5.6 4.0 - 5.6 % Final   Hgb A1c MFr Bld  Date/Time Value Ref Range Status  10/09/2023 02:34 PM 5.8 (H) 4.8 - 5.6 % Final    Comment:             Prediabetes: 5.7 - 6.4          Diabetes: >6.4          Glycemic control for adults with diabetes: <7.0   02/04/2021 07:39 AM 5.5 <5.7 % of total Hgb Final    Comment:    For the purpose of screening for the presence of diabetes: . <5.7%       Consistent with the absence of diabetes 5.7-6.4%    Consistent with increased risk for diabetes             (prediabetes) > or =6.5%  Consistent with diabetes . This assay result is consistent with a decreased risk of diabetes. . Currently, no consensus exists regarding use of hemoglobin A1c for diagnosis of diabetes in children. . According to American Diabetes Association (ADA) guidelines, hemoglobin A1c <7.0% represents optimal control in non-pregnant diabetic patients. Different metrics may apply to specific patient populations.  Standards of Medical Care in Diabetes(ADA). .     CBG: No results for input(s): GLUCAP in the last 168 hours.  Review of Systems:   Shortness of breath, chest pain, hemoptysis  Past Medical History:  She,  has a past medical history of Allergy, Anemia, Arthritis, Asthma, Blood dyscrasia, Blood transfusion without reported diagnosis, Colon cancer (HCC) (2009), GERD (gastroesophageal reflux disease), Hiatal hernia, and Hypertension.   Surgical History:   Past Surgical History:  Procedure Laterality Date   BRONCHIAL BIOPSY  07/05/2024   Procedure: BRONCHOSCOPY, WITH BIOPSY;  Surgeon: Meade Verdon RAMAN, MD;  Location: [facility] ENDOSCOPY;  Service: Pulmonary;;   BRONCHIAL WASHINGS  07/05/2024   Procedure: IRRIGATION, BRONCHUS;  Surgeon: Meade Verdon RAMAN, MD;  Location: [facility] ENDOSCOPY;  Service: Pulmonary;;   CESAREAN SECTION     1982, 1988, 1989   CHOLECYSTECTOMY  1980   COLONOSCOPY     COLONOSCOPY WITH PROPOFOL  N/A 10/02/2015   Procedure:  COLONOSCOPY WITH PROPOFOL ;  Surgeon: Gwendlyn ONEIDA Buddy, MD;  Location: [facility] ENDOSCOPY;  Service: Endoscopy;  Laterality: N/A;   laparoscopic sleeve gastrectomy N/A    Surgeon- Bethann Teppara   PARTIAL COLECTOMY  2009   POLYPECTOMY     TUBAL LIGATION  1989   VIDEO BRONCHOSCOPY N/A 07/05/2024   Procedure: BRONCHOSCOPY, WITH FLUOROSCOPY;  Surgeon: Meade Verdon RAMAN, MD;  Location: [facility] ENDOSCOPY;  Service: Pulmonary;  Laterality: N/A;  WITH BAL     Social History:   reports that she has never smoked. She has been exposed to tobacco smoke. She has never used smokeless tobacco. She reports that she does not drink alcohol and does not use drugs.   Family History:  Her family history includes Asthma in her mother; Heart attack in her  father; Hypertension in her father and paternal grandfather; Lupus in her sister; Stroke in her paternal grandmother. There is no history of Colon cancer, Stomach cancer, or Esophageal cancer.   Allergies Allergies  Allergen Reactions   Augmentin  [Amoxicillin -Pot Clavulanate] Diarrhea     Home Medications  Prior to Admission medications   Medication Sig Start Date End Date Taking? Authorizing Provider  acetaminophen  (TYLENOL ) 500 MG tablet Take 500 mg by mouth every 6 (six) hours as needed.    [provider]  albuterol  (PROVENTIL ) (2.5 MG/3ML) 0.083% nebulizer solution Take 3 mLs (2.5 mg total) by nebulization every 4 (four) hours as needed for wheezing or shortness of breath. 10/09/23   Alvan Dorothyann BIRCH, MD  albuterol  (VENTOLIN  HFA) 108 [phone] Base) MCG/ACT inhaler Inhale 2 puffs into the lungs every 6 (six) hours as needed for wheezing or shortness of breath. 10/09/23   Alvan Dorothyann BIRCH, MD  cetirizine (ZYRTEC) 10 MG tablet Take 10 mg by mouth daily.     [provider]  fluticasone  (FLONASE ) 50 MCG/ACT nasal spray Place 2 sprays into both nostrils daily. 10/09/23   Alvan Dorothyann BIRCH, MD  Multiple Vitamin (MULTIVITAMIN) tablet Take 1 tablet by  mouth daily.    [provider]  ondansetron  (ZOFRAN -ODT) 4 MG disintegrating tablet Take 1 tablet (4 mg total) by mouth every 8 (eight) hours as needed for nausea or vomiting. 02/01/24   Alvan Dorothyann BIRCH, MD  ZEPBOUND  10 MG/0.5ML Pen INJECT 10 MG UNDER THE SKIN ONCE WEEKLY 06/15/24   Alvan Dorothyann BIRCH, MD

## 2024-07-08 NOTE — ED Provider Notes (Signed)
 [name] EMERGENCY DEPARTMENT AT [facility] Provider Note   CSN: [id number] Arrival date & time: 07/08/24  9164     Patient presents with: Pneumothorax    [name] is a 68 y.o. female.   68 year old female who presents to the emergency department shortness of breath.  Had a bronchoscopy on 9/2 with biopsy to assess for sarcoidosis.  Reports that afterwards she has had some shortness of breath and small-volume hemoptysis.  Had an x-ray that showed that she has moderate-sized pneumothorax.  Referred into the emergency department for additional evaluation       Prior to Admission medications   Medication Sig Start Date End Date Taking? Authorizing Provider  acetaminophen  (TYLENOL ) 500 MG tablet Take 500 mg by mouth every 6 (six) hours as needed for moderate pain (pain score 4-6).   Yes [provider]  albuterol  (PROVENTIL ) (2.5 MG/3ML) 0.083% nebulizer solution Take 3 mLs (2.5 mg total) by nebulization every 4 (four) hours as needed for wheezing or shortness of breath. 10/09/23  Yes Alvan Dorothyann BIRCH, MD  albuterol  (VENTOLIN  HFA) 108 (90 Base) MCG/ACT inhaler Inhale 2 puffs into the lungs every 6 (six) hours as needed for wheezing or shortness of breath. 10/09/23  Yes Alvan Dorothyann BIRCH, MD  cetirizine (ZYRTEC) 10 MG tablet Take 10 mg by mouth daily.    Yes [provider]  fluticasone  (FLONASE ) 50 MCG/ACT nasal spray Place 2 sprays into both nostrils daily. Patient taking differently: Place 2 sprays into both nostrils daily as needed for allergies or rhinitis. 10/09/23  Yes Alvan Dorothyann BIRCH, MD  Multiple Vitamin (MULTIVITAMIN) tablet Take 1 tablet by mouth daily.   Yes [provider]  ondansetron  (ZOFRAN -ODT) 4 MG disintegrating tablet Take 1 tablet (4 mg total) by mouth every 8 (eight) hours as needed for nausea or vomiting. 02/01/24  Yes Alvan Dorothyann BIRCH, MD  ZEPBOUND  10 MG/0.5ML Pen INJECT 10 MG UNDER THE SKIN ONCE WEEKLY  06/15/24  Yes Alvan Dorothyann BIRCH, MD    Allergies: Augmentin  [amoxicillin -pot clavulanate]    Review of Systems  Updated Vital Signs BP 127/81 (BP Location: Right Arm)   Pulse 77   Temp 98.5 F (36.9 C) (Oral)   Resp 16   Ht 5' (1.524 m)   Wt 100.7 kg   SpO2 100%   BMI 43.36 kg/m   Physical Exam Vitals and nursing note reviewed.  Constitutional:      General: She is not in acute distress.    Appearance: She is well-developed.  HENT:     Head: Normocephalic and atraumatic.     Right Ear: External ear normal.     Left Ear: External ear normal.     Nose: Nose normal.  Eyes:     Extraocular Movements: Extraocular movements intact.     Conjunctiva/sclera: Conjunctivae normal.     Pupils: Pupils are equal, round, and reactive to light.  Cardiovascular:     Rate and Rhythm: Normal rate and regular rhythm.     Heart sounds: No murmur heard. Pulmonary:     Effort: Pulmonary effort is normal. No respiratory distress.     Comments: Diminished on right side Musculoskeletal:     Cervical back: Normal range of motion and neck supple.  Skin:    General: Skin is warm and dry.  Neurological:     Mental Status: She is alert and oriented to person, place, and time. Mental status is at baseline.  Psychiatric:  Mood and Affect: Mood normal.     (all labs ordered are listed, but only abnormal results are displayed) Labs Reviewed  CBC - Abnormal; Notable for the following components:      Result Value   RBC 5.54 (*)    MCV 78.7 (*)    MCH 24.7 (*)    RDW 15.9 (*)    All other components within normal limits  COMPREHENSIVE METABOLIC PANEL WITH GFR - Abnormal; Notable for the following components:   Calcium 8.8 (*)    Total Protein 6.4 (*)    Albumin 3.3 (*)    Total Bilirubin 1.5 (*)    All other components within normal limits  I-STAT CHEM 8, ED - Abnormal; Notable for the following components:   Potassium 3.4 (*)    Calcium, Ion 1.09 (*)    All other components  within normal limits  HIV ANTIBODY (ROUTINE TESTING W REFLEX)    EKG: EKG Interpretation Date/Time:  Friday July 08 2024 08:49:46 EDT Ventricular Rate:  93 PR Interval:  156 QRS Duration:  83 QT Interval:  332 QTC Calculation: 413 R Axis:   -23  Text Interpretation: Sinus rhythm Low voltage, precordial leads Left ventricular hypertrophy Confirmed by Yolande Charleston [phone]) on 07/08/2024 9:35:46 AM  Radiology: [facility] Chest Portable 1 View Result Date: 07/08/2024 CLINICAL DATA:  Worsening shortness of breath status post bronchoscopy, now status post pleural catheter placement EXAM: PORTABLE CHEST 1 VIEW COMPARISON:  Earlier same day chest radiograph FINDINGS: Interval placement of right apical pigtail pleural catheter. Interval improved right basilar patchy atelectasis. Scattered patchy right upper lung opacities may represent atelectasis or postprocedural change secondary to bronchoscopy. Unchanged patchy medial right lower lung opacity. Interval resolution of right pneumothorax. Trace blunting of right costophrenic angle. Similar mildly enlarged cardiomediastinal silhouette. No acute osseous abnormality. IMPRESSION: 1. Interval placement of right apical pigtail pleural catheter with resolution of right pneumothorax. 2. Scattered patchy right upper lung opacities may represent atelectasis or postprocedural change secondary to bronchoscopy. 3. Unchanged patchy medial right lower lung opacity, likely atelectasis. Electronically Signed   By: Limin  Xu M.D.   On: 07/08/2024 11:22   DG Chest Portable 1 View Result Date: 07/08/2024 CLINICAL DATA:  ptx on op x-ray EXAM: PORTABLE CHEST - 1 VIEW COMPARISON:  July 07, 2024 FINDINGS: Diffuse bronchial wall thickening with patchy interstitial opacities throughout both lungs. Redemonstration of a moderate sized right pneumothorax measuring 4.4 cm at the lung apex (previously, 3.9 cm). No pleural effusion. The cardiac silhouette is at the upper limits of  normal, likely accentuated by AP technique and low lung volumes. No acute fracture or destructive lesions. Multilevel thoracic osteophytosis. IMPRESSION: Continued increase in size of the large right pneumothorax, now measuring 4.4 cm at the right lung apex (previously, 3.9 cm). No significant mediastinal shift to suggest tension. These results will be called to the ordering clinician or representative by the Radiologist Assistant and communication documented in the PACS or [employer]. Electronically Signed   By: Rogelia Myers M.D.   On: 07/08/2024 09:15   DG Chest 2 View Result Date: 07/07/2024 CLINICAL DATA:  Follow-up pneumothorax. EXAM: CHEST - 2 VIEW COMPARISON:  07/05/2024 FINDINGS: Right pneumothorax has increased from prior exam, now moderate approximately 25%. No definite mediastinal shift. Chronic lung disease with bronchial thickening and interstitial coarsening. The heart is normal in size. IMPRESSION: Increased size of right pneumothorax, now moderate approximately 25%. No definite mediastinal shift. Electronically Signed   By: Andrea Marlee HERO.D.  On: 07/07/2024 22:37     Procedures  EMERGENCY DEPARTMENT US  LUNG EXAM Study: Limited Ultrasound of the Lung and Thorax  INDICATIONS: Dyspnea Multiple views of both lungs using sagittal orientation were obtained.  PERFORMED BY: Myself IMAGES ARCHIVED?: Yes LIMITATIONS: None VIEWS USED: Anterior lung fields INTERPRETATION: Pneumothorax, on right  Medications Ordered in the ED  acetaminophen  (TYLENOL ) tablet 1,000 mg (1,000 mg Oral Given 07/08/24 1130)  methocarbamol  (ROBAXIN ) tablet 500 mg (500 mg Oral Given 07/08/24 1130)  gabapentin  (NEURONTIN ) capsule 300 mg (300 mg Oral Given 07/08/24 1131)  HYDROmorphone  (DILAUDID ) injection 0.5 mg (0.5 mg Intravenous Given 07/08/24 1313)  ipratropium-albuterol  (DUONEB) 0.5-2.5 (3) MG/3ML nebulizer solution 3 mL (has no administration in time range)  ketorolac  (TORADOL ) 30 MG/ML injection 15  mg (15 mg Intravenous Given 07/08/24 1032)    Clinical Course as of 07/08/24 1610  Fri Jul 08, 2024  0935 Dr Meade from PCCM will come attempt an aspiration and place pigtail if that does not resolve the patient's symptoms [RP]  1010 Dr Meade at the bedside [RP]    Clinical Course User Index [RP] Yolande Lamar BROCKS, MD                                 Medical Decision Making Amount and/or Complexity of Data Reviewed Labs: ordered. Radiology: ordered.  Risk Prescription drug management. Decision regarding hospitalization.   68 year old female who presents emergency department shortness of breath and pneumothorax on outpatient x-ray after bronchoscopy  Initial Ddx:  Pneumothorax, tension pneumo, sarcoidosis  MDM/Course:  Patient presents emergency department with outpatient x-ray showing a moderate-sized pneumothorax after bronchoscopy was performed recently.  On arrival she is satting well on room air.  Not in respiratory distress.  She is normotensive.  Does have diminished breath sounds on the right side.  No lung sliding on the right was noted on ultrasound which is consistent with pneumothorax.  X-ray did show that the pneumothorax had increased in size.  Discussed with the patient's outpatient pulmonologist Dr. Meade who came into the emergency department and placed a pigtail chest tube.  Patient admitted to hospitalist for further management.   This patient presents to the ED for concern of complaints listed in HPI, this involves an extensive number of treatment options, and is a complaint that carries with it a high risk of complications and morbidity. Disposition including potential need for admission considered.   Dispo: Admit to Floor  Additional history obtained from spouse The following labs were independently interpreted: Chemistry and show no acute abnormality I independently reviewed the following imaging with scope of interpretation limited to determining acute life  threatening conditions related to emergency care: Chest x-ray and agree with the radiologist interpretation with the following exceptions: none I personally reviewed and interpreted cardiac monitoring: normal sinus rhythm  I personally reviewed and interpreted the pt's EKG: see above for interpretation  I have reviewed the patients home medications and made adjustments as needed Consults: Hospitalist and pulmonology Social Determinants of health:  Geriatric  Portions of this note were generated with Scientist, clinical (histocompatibility and immunogenetics). Dictation errors may occur despite best attempts at proofreading.     Final diagnoses:  Pneumothorax after biopsy    ED Discharge Orders     None          Yolande Lamar BROCKS, MD 07/08/24 1610

## 2024-07-08 NOTE — ED Triage Notes (Signed)
 PT arrives via POV. Pt reports she had a bronchoscopy on 9/2. Xray showed a small right pneumothorax after the bronchoscopy. She reports she has continued to have woresening sob. She had a repeat chest xray yesterday which showed an increase in size of the pneumothorax. PT arrives AxOx4.

## 2024-07-08 NOTE — Telephone Encounter (Signed)
 Copied from CRM [id number]. Topic: Clinical - Lab/Test Results >> Jul 08, 2024  8:45 AM Corean SAUNDERS wrote: Reason for CRM: Tiffany from [facility] calling to confirm that we have received patients imagine results from 9/4 - Confirmed results have been received.  Dr. Meade has reviewed.

## 2024-07-09 ENCOUNTER — Inpatient Hospital Stay (HOSPITAL_COMMUNITY)

## 2024-07-09 DIAGNOSIS — J939 Pneumothorax, unspecified: Secondary | ICD-10-CM | POA: Diagnosis not present

## 2024-07-09 LAB — BASIC METABOLIC PANEL WITH GFR
Anion gap: 7 (ref 5–15)
BUN: 10 mg/dL (ref 8–23)
CO2: 27 mmol/L (ref 22–32)
Calcium: 8.5 mg/dL — ABNORMAL LOW (ref 8.9–10.3)
Chloride: 106 mmol/L (ref 98–111)
Creatinine, Ser: 1.1 mg/dL — ABNORMAL HIGH (ref 0.44–1.00)
GFR, Estimated: 55 mL/min — ABNORMAL LOW (ref >60)
Glucose, Bld: 94 mg/dL (ref 70–99)
Potassium: 4 mmol/L (ref 3.5–5.1)
Sodium: 140 mmol/L (ref 135–145)

## 2024-07-09 MED ORDER — DOCUSATE SODIUM 100 MG PO CAPS: 100.0000 mg | ORAL_CAPSULE | Freq: Two times a day (BID) | ORAL | Status: DC | PRN

## 2024-07-09 MED ORDER — OXYCODONE HCL 5 MG PO TABS: 5.0000 mg | ORAL_TABLET | ORAL | Status: DC | PRN

## 2024-07-09 MED ORDER — SODIUM CHLORIDE 0.9% FLUSH
10.0000 mL | Freq: Three times a day (TID) | INTRAVENOUS | Status: DC
  Administered 2024-07-09: 10 mL via INTRAPLEURAL

## 2024-07-09 MED ORDER — LORATADINE 10 MG PO TABS
10.0000 mg | ORAL_TABLET | Freq: Every day | ORAL | Status: DC
  Administered 2024-07-09 – 2024-07-10 (×2): 10 mg via ORAL
  Filled 2024-07-09 (×2): qty 1

## 2024-07-09 MED ORDER — ONDANSETRON HCL 4 MG/2ML IJ SOLN: 4.0000 mg | Freq: Four times a day (QID) | INTRAMUSCULAR | Status: DC | PRN

## 2024-07-09 NOTE — Hospital Course (Addendum)
 PMH of HTN and colon cancer presents the hospital with complaints of shortness of breath. 9/2 underwent bronchoscopy for sarcoidosis evaluation.  Follow-up chest x-ray showed small apical pneumothorax.  Recommended for close follow-up outpatient. 9/4 repeat chest x-ray shows slightly worsening of pneumothorax and was requested to come to the hospital for evaluation. 9/5 presented to ER.  Admitted.  Chest tube placed.  Assessment and Plan: Right pneumothorax. After procedure. Chest x-ray on 9/6 shows evidence of resolution of the pneumothorax. Chest tube will be under waterseal per PCCM. Continue pain control. Monitor.  Hemoptysis. Patient reports that she has hemoptysis. H&H relatively stable. Not on any anticoagulation prior to admission. Likely from recent bronchoscopy. Pulmonary considering possible TXA neb if she has persistent hemoptysis. Will monitor.  Concern for sarcoidosis. Underwent bronchoscopy outpatient. Monitor for now.  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  Hyperbilirubinemia. Mildly elevated. Will recheck tomorrow.  Mild renal insufficiency. Serum creatinine baseline 0.7.  Worsening to 1.1. Monitor for now.  Obesity Class 3 Body mass index is 43.36 kg/m.  Placing the pt at higher risk of poor outcomes.

## 2024-07-09 NOTE — Plan of Care (Signed)

## 2024-07-09 NOTE — Progress Notes (Signed)
 [facility] Progress Note Patient: [name] FMW:[id number] DOB: January 23, 1956  DOA: 07/08/2024 DOS: the patient was seen and examined on 07/09/2024  Brief Hospital Course: PMH of HTN and colon cancer presents the hospital with complaints of shortness of breath. 9/2 underwent bronchoscopy for sarcoidosis evaluation.  Follow-up chest x-ray showed small apical pneumothorax.  Recommended for close follow-up outpatient. 9/4 repeat chest x-ray shows slightly worsening of pneumothorax and was requested to come to the hospital for evaluation. 9/5 presented to ER.  Admitted.  Chest tube placed.  Assessment and Plan: Right pneumothorax. After procedure. Chest x-ray on 9/6 shows evidence of resolution of the pneumothorax. Chest tube will be under waterseal per PCCM. Continue pain control. Monitor.  Hemoptysis. Patient reports that she has hemoptysis. H&H relatively stable. Not on any anticoagulation prior to admission. Likely from recent bronchoscopy. Pulmonary considering possible TXA neb if she has persistent hemoptysis. Will monitor.  Concern for sarcoidosis. Underwent bronchoscopy outpatient. Monitor for now.  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  Hyperbilirubinemia. Mildly elevated. Will recheck tomorrow.  Mild renal insufficiency. Serum creatinine baseline 0.7.  Worsening to 1.1. Monitor for now.  Obesity Class 3 Body mass index is 43.36 kg/m.  Placing the pt at higher risk of poor outcomes.    Subjective: Reports cough with blood.  No nausea or vomiting.  No chest pain.  Physical Exam: Clear to auscultation. S1-S2 present. Bowel sound present. Trace edema.  Data Reviewed: I have Reviewed nursing notes, Vitals, and Lab results. Since last encounter, pertinent lab results CBC and BMP   . I have ordered test including CBC and BMP  .   Disposition: Status is: Inpatient  Family Communication: Family at bedside Level of care: Med-Surg   Vitals:   07/09/24 0453  07/09/24 0840 07/09/24 1221 07/09/24 1605  BP: 122/69 132/79 124/75 114/64  Pulse: 67 82 82 94  Resp: 16 19 19 17   Temp: 97.7 F (36.5 C) 97.8 F (36.6 C) 97.8 F (36.6 C) 98.2 F (36.8 C)  TempSrc: Oral Oral  Oral  SpO2: 98% 99% 98% 99%  Weight:      Height:         Author: Yetta Blanch, MD 07/09/2024 6:12 PM  Please look on [email or web address] to find out who is on call.

## 2024-07-09 NOTE — Consult Note (Signed)
   NAME:  [name], MRN:  [id number], DOB:  1956/09/27, LOS: 1 ADMISSION DATE:  07/08/2024, CONSULTATION DATE:  07/09/2024 REFERRING MD:  Tobie Yetta HERO, MD, CHIEF COMPLAINT:  pneumothorax   History of Present Illness:  [name] is a 68 year old with past medical history of shortness of breath and abnormal CT chest with possible evaluation for sarcoidosis.  She had a bronchoscopy on September 2 with transbronchial biopsies.  Following the procedure she had a very small right apical pneumothorax which we are managing expectantly with observation and repeat imaging.  Fortunately she had worsening shortness of breath and chest discomfort.  She presents to the ED this morning with those symptoms.  Chest x-ray shows worsening and enlargement of the right apical pneumothorax.  PCCM consulted for evaluation of the pneumothorax.  She says that her daughter brought her over a pulse oximeter and she was dropping below 88% last night.  She is now on 2 L of oxygen.  Pertinent  Medical History  Seasonal allergic rhinitis Shortness of breath  Significant Hospital Events: Including procedures, antibiotic start and stop dates in addition to other pertinent events     Interim History / Subjective:  Admitted yesterday for ptx. Chest tube on suction Improved shortness of breath Hemoptysis has improved. Still a little bit of bloody sputum per patient Objective    Blood pressure 124/75, pulse 82, temperature 97.8 F (36.6 C), resp. rate 19, height 5' (1.524 m), weight 100.7 kg, SpO2 98%.        Intake/Output Summary (Last 24 hours) at 07/09/2024 1329 Last data filed at 07/09/2024 0500 Gross per 24 hour  Intake 200 ml  Output 8 ml  Net 192 ml   Filed Weights   07/08/24 0933  Weight: 100.7 kg    Physical Exam: General: Well-appearing, no acute distress HENT: [place], AT Eyes: EOMI, no scleral icterus Respiratory: Clear to auscultation bilaterally.  No crackles, wheezing or rales Cardiovascular:  RRR, -M/R/G, no JVD Extremities:-Edema,-tenderness Neuro: AAO x4, CNII-XII grossly intact Psych: Normal mood, normal affect  CXR 07/09/24 right chest tube in place. 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   - CXR with no discernible pneumothorax - Chest tube placed on water seal - CXR in am - It sounds like her hemoptysis has slowed down significantly.  If she is having worsening hemoptysis can consider a trial of TXA neb. - For now continue albuterol  inhalers as needed  Care Time: 35 min  Slater Staff, M.D. [facility] Pulmonary/Critical Care Medicine 07/09/2024 1:29 PM   See Amion for personal pager For hours between 7 PM to 7 AM, please call Elink for urgent questions

## 2024-07-09 NOTE — Plan of Care (Signed)

## 2024-07-10 ENCOUNTER — Inpatient Hospital Stay (HOSPITAL_COMMUNITY)

## 2024-07-10 ENCOUNTER — Other Ambulatory Visit (HOSPITAL_COMMUNITY): Payer: Self-pay

## 2024-07-10 DIAGNOSIS — J939 Pneumothorax, unspecified: Secondary | ICD-10-CM | POA: Diagnosis not present

## 2024-07-10 LAB — CBC
HCT: 39.5 % (ref 36.0–46.0)
Hemoglobin: 12.5 g/dL (ref 12.0–15.0)
MCH: 24.8 pg — ABNORMAL LOW (ref 26.0–34.0)
MCHC: 31.6 g/dL (ref 30.0–36.0)
MCV: 78.4 fL — ABNORMAL LOW (ref 80.0–100.0)
Platelets: 254 K/uL (ref 150–400)
RBC: 5.04 MIL/uL (ref 3.87–5.11)
RDW: 15.9 % — ABNORMAL HIGH (ref 11.5–15.5)
WBC: 7.9 K/uL (ref 4.0–10.5)
nRBC: 0 % (ref 0.0–0.2)

## 2024-07-10 LAB — AEROBIC/ANAEROBIC CULTURE W GRAM STAIN (SURGICAL/DEEP WOUND)
Culture: NO GROWTH
Culture: NO GROWTH
Gram Stain: NONE SEEN
Gram Stain: NONE SEEN

## 2024-07-10 LAB — COMPREHENSIVE METABOLIC PANEL WITH GFR
ALT: 10 U/L (ref 0–44)
AST: 16 U/L (ref 15–41)
Albumin: 2.7 g/dL — ABNORMAL LOW (ref 3.5–5.0)
Alkaline Phosphatase: 71 U/L (ref 38–126)
Anion gap: 7 (ref 5–15)
BUN: 9 mg/dL (ref 8–23)
CO2: 27 mmol/L (ref 22–32)
Calcium: 8.4 mg/dL — ABNORMAL LOW (ref 8.9–10.3)
Chloride: 105 mmol/L (ref 98–111)
Creatinine, Ser: 0.82 mg/dL (ref 0.44–1.00)
GFR, Estimated: >60 mL/min (ref >60)
Glucose, Bld: 95 mg/dL (ref 70–99)
Potassium: 4 mmol/L (ref 3.5–5.1)
Sodium: 139 mmol/L (ref 135–145)
Total Bilirubin: 0.8 mg/dL (ref 0.0–1.2)
Total Protein: 5.4 g/dL — ABNORMAL LOW (ref 6.5–8.1)

## 2024-07-10 MED ORDER — DEXTROMETHORPHAN POLISTIREX ER 30 MG/5ML PO SUER
30.0000 mg | Freq: Two times a day (BID) | ORAL | Status: DC
  Filled 2024-07-10 (×2): qty 5

## 2024-07-10 MED ORDER — DEXTROMETHORPHAN POLISTIREX ER 30 MG/5ML PO SUER
30.0000 mg | Freq: Two times a day (BID) | ORAL | 0 refills | Status: DC
  Filled 2024-07-10: qty 89, 9d supply, fill #0

## 2024-07-10 MED ORDER — GUAIFENESIN ER 600 MG PO TB12: 600.0000 mg | ORAL_TABLET | Freq: Two times a day (BID) | ORAL | Status: DC

## 2024-07-10 MED ORDER — DOCUSATE SODIUM 100 MG PO CAPS
100.0000 mg | ORAL_CAPSULE | Freq: Two times a day (BID) | ORAL | 0 refills | Status: AC | PRN
  Filled 2024-07-10: qty 10, 5d supply, fill #0

## 2024-07-10 MED ORDER — METHOCARBAMOL 500 MG PO TABS
500.0000 mg | ORAL_TABLET | Freq: Three times a day (TID) | ORAL | 0 refills | Status: DC | PRN
  Filled 2024-07-10: qty 15, 5d supply, fill #0

## 2024-07-10 MED ORDER — BENZONATATE 100 MG PO CAPS
100.0000 mg | ORAL_CAPSULE | Freq: Three times a day (TID) | ORAL | 0 refills | Status: DC
  Filled 2024-07-10: qty 20, 7d supply, fill #0

## 2024-07-10 MED ORDER — BENZONATATE 100 MG PO CAPS: 100.0000 mg | ORAL_CAPSULE | Freq: Three times a day (TID) | ORAL | Status: DC

## 2024-07-10 MED ORDER — GUAIFENESIN ER 600 MG PO TB12
600.0000 mg | ORAL_TABLET | Freq: Two times a day (BID) | ORAL | 0 refills | Status: AC
  Filled 2024-07-10: qty 14, 7d supply, fill #0

## 2024-07-10 NOTE — Progress Notes (Signed)
 PCCM Progress Note  Chest tube removed. Follow-up CXR with no discernible pneumothorax. Ok to discharge. Will message her primary pulmonologist for sooner follow-up if needed. She will call for bronchoscopy results when available. Keep current appointment in November.

## 2024-07-10 NOTE — Consult Note (Signed)
   NAME:  [name], MRN:  [id number], DOB:  10-Dec-1955, LOS: 2 ADMISSION DATE:  07/08/2024, CONSULTATION DATE:  07/10/2024 REFERRING MD:  Tobie Yetta HERO, MD, CHIEF COMPLAINT:  pneumothorax   History of Present Illness:  [name] is a 68 year old with past medical history of shortness of breath and abnormal CT chest with possible evaluation for sarcoidosis.  She had a bronchoscopy on September 2 with transbronchial biopsies.  Following the procedure she had a very small right apical pneumothorax which we are managing expectantly with observation and repeat imaging.  Fortunately she had worsening shortness of breath and chest discomfort.  She presents to the ED this morning with those symptoms.  Chest x-ray shows worsening and enlargement of the right apical pneumothorax.  PCCM consulted for evaluation of the pneumothorax.  She says that her daughter brought her over a pulse oximeter and she was dropping below 88% last night.  She is now on 2 L of oxygen.  Pertinent  Medical History  Seasonal allergic rhinitis Shortness of breath  Significant Hospital Events: Including procedures, antibiotic start and stop dates in addition to other pertinent events     Interim History / Subjective:  Chest tube on waterseal No further hemoptysis Objective    Blood pressure 139/67, pulse 84, temperature 98.1 F (36.7 C), temperature source Oral, resp. rate 18, height 5' (1.524 m), weight 100.7 kg, SpO2 99%.        Intake/Output Summary (Last 24 hours) at 07/10/2024 0904 Last data filed at 07/10/2024 0600 Gross per 24 hour  Intake --  Output 28 ml  Net -28 ml   Filed Weights   07/08/24 0933  Weight: 100.7 kg    Physical Exam: General: Well-appearing, no acute distress HENT: [place], AT Eyes: EOMI, no scleral icterus Respiratory:  Clear to auscultation bilaterally.  No crackles, wheezing or rales. Right anterior chest tube in place Cardiovascular: RRR, -M/R/G, no  JVD Extremities:-Edema,-tenderness Neuro: AAO x4, CNII-XII grossly intact Psych: Normal mood, normal affect  CXR 07/09/24 right chest tube in place. No pneumothorax CXR 07/10/24 right chest tube in place. 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   - CXR with no pneumothorax - Chest tube removed - CXR in 2 hours. If stable, ok to discharge from pulmonary standpoint - Precautions discussed including respiratory symptoms or chest pain. Recommend seeking medical evaluation if symptoms occur and persist  Care Time: 50 min  Slater Staff, M.D. [facility] Pulmonary/Critical Care Medicine 07/10/2024 9:07 AM   See Amion for personal pager For hours between 7 PM to 7 AM, please call Elink for urgent questions

## 2024-07-10 NOTE — Progress Notes (Signed)
 As per the patient the chest tube will be removed today anyway, so she refused and did not allow to flush the tube and change the dressing at the site.

## 2024-07-11 ENCOUNTER — Ambulatory Visit: Payer: Self-pay | Admitting: Family Medicine

## 2024-07-11 ENCOUNTER — Telehealth: Payer: Self-pay | Admitting: *Deleted

## 2024-07-11 ENCOUNTER — Telehealth: Payer: Self-pay

## 2024-07-11 ENCOUNTER — Other Ambulatory Visit (HOSPITAL_COMMUNITY): Payer: Self-pay

## 2024-07-11 NOTE — Transitions of Care (Post Inpatient/ED Visit) (Signed)
   07/11/2024  Name: [name] MRN: [id number] DOB: July 22, 1956  Today's TOC FU Call Status: Today's TOC FU Call Status:: Successful TOC FU Call Completed TOC FU Call Complete Date:  (Patient answered and asked what the call was about-TOC RN explained TOC program and patient quickly said  I'm a retired Engineer, civil (consulting) and I understand everything about my medications, I've already scheduled my apppointments, and I'm all set - declined TOC) Patient's Name and Date of Birth confirmed.  Transition Care Management Follow-up Telephone Call Date of Discharge: 07/10/24 Discharge Facility: [facility]) Type of Discharge: Inpatient Admission Primary Inpatient Discharge Diagnosis:: Pneumothorax  Shona Prow RN, CCM [place]  [facility]Population Health RN Care Manager [phone]

## 2024-07-11 NOTE — Telephone Encounter (Signed)
 Received message that EKG order needed to be entered so EKG could be scanned into chart.  Order entered.  Placed in scan folder.  Nothing further needed.

## 2024-07-13 ENCOUNTER — Encounter: Payer: Self-pay | Admitting: Family Medicine

## 2024-07-13 ENCOUNTER — Ambulatory Visit (INDEPENDENT_AMBULATORY_CARE_PROVIDER_SITE_OTHER): Admitting: Family Medicine

## 2024-07-13 VITALS — BP 121/76 | HR 97 | Temp 97.8°F | Resp 20 | Ht 60.0 in | Wt 222.0 lb

## 2024-07-13 DIAGNOSIS — E66813 Obesity, class 3: Secondary | ICD-10-CM

## 2024-07-13 DIAGNOSIS — Z7689 Persons encountering health services in other specified circumstances: Secondary | ICD-10-CM | POA: Diagnosis not present

## 2024-07-13 DIAGNOSIS — J95811 Postprocedural pneumothorax: Secondary | ICD-10-CM | POA: Diagnosis not present

## 2024-07-13 DIAGNOSIS — Z23 Encounter for immunization: Secondary | ICD-10-CM

## 2024-07-13 MED ORDER — ZEPBOUND 12.5 MG/0.5ML ~~LOC~~ SOAJ: 12.5000 mg | SUBCUTANEOUS | 2 refills | Status: AC

## 2024-07-13 NOTE — Progress Notes (Signed)
 Established Patient Office Visit  Subjective  Patient ID: [name], female    DOB: 04-Dec-1955  Age: 68 y.o. MRN: [id number]  Chief Complaint  Patient presents with   Follow-up    HPI  Discussed the use of AI scribe software for clinical note transcription with the patient, who gave verbal consent to proceed.  History of Present Illness [name] is a 68 year old female who presents with shortness of breath and fatigue following a recent pneumothorax s/p lung bx for possible sarcoidosis.  Dyspnea and fatigue - Shortness of breath and fatigue, particularly with exertion such as walking her dog - Unable to complete her usual walking route due to symptoms - No fever, though overall feeling some better.   Recent pneumothorax and hospitalization - Underwent bronchoscopy and biopsy on September 2nd, resulting in a small pneumothorax - Initial expectation was for spontaneous resolution, but developed worsening shortness of breath - Follow-up chest X-ray showed increased pneumothorax size - Required needle aspiration and pigtail chest tube placement - Hospitalized from Friday to Sunday for management of pneumothorax  Pulmonary medication management - No recent use of systemic steroids - Continues to use inhalers as previously prescribed  Follow-up weight management -Currently doing well on tirzepatide .  Unfortunately she did receive a letter from her insurance that they are no longer going to cover the medication -She is willing to pay out-of-pocket at least for couple of months.  She still wants to get her weight down to 200 pounds so that she can have her knee replacement surgery. -Otherwise tolerating well without any major side effects she does not feel like she has been losing weight quite as briskly as she was before    ROS    Objective:     BP 121/76   Pulse 97   Temp 97.8 F (36.6 C) (Oral)   Resp 20   Ht 5' (1.524 m)   SpO2 98%   BMI 43.36 kg/m     Physical Exam Vitals and nursing note reviewed.  Constitutional:      Appearance: Normal appearance.  HENT:     Head: Normocephalic and atraumatic.  Eyes:     Conjunctiva/sclera: Conjunctivae normal.  Cardiovascular:     Rate and Rhythm: Normal rate and regular rhythm.  Pulmonary:     Effort: Pulmonary effort is normal.     Breath sounds: Normal breath sounds.  Skin:    General: Skin is warm and dry.  Neurological:     Mental Status: She is alert.  Psychiatric:        Mood and Affect: Mood normal.      No results found for any visits on 07/13/24.    The 10-year ASCVD risk score (Arnett DK, et al., 2019) is: 9.6%    Assessment & Plan:   Problem List Items Addressed This Visit       Other   Encounter for weight management   Visit #: 5 Starting Weight: 247 lbs / BMI 48    Current weight: 222 lbs/BMI 43 Previous weight: 226 lbs  Change in weight from last OV: down 4 lbs  Goal weight: 202 lbs/BMI under 40 for knee surgery  Dietary goals: Work on cutting back on soda and sweetened beverages such as juice.  She really struggles with that.  Continue to focus on eating a protein with each meal. Exercise goals: Stay active.  She is limited from knee arthritis. Work on Event organiser.   Medication: Inc to  12.5 mg Zepbound  next month. Follow-up and referrals: 12 wks  She has nausea meds at home.      Relevant Medications   tirzepatide  (ZEPBOUND ) 12.5 MG/0.5ML Pen   Other Visit Diagnoses       Pneumothorax after biopsy    -  Primary     Immunization due       Relevant Orders   Flu vaccine HIGH DOSE PF(Fluzone Trivalent) (Completed)     Obesity, Class III, BMI 40-49.9 (morbid obesity)       Relevant Medications   tirzepatide  (ZEPBOUND ) 12.5 MG/0.5ML Pen      Assessment and Plan Assessment & Plan Postprocedural pneumothorax Postprocedural pneumothorax after bronchoscopy and biopsy for suspected sarcoidosis. Initially small, enlarged on follow-up x-ray,  required hospital admission and pigtail catheter. The patient reports experiencing fatigue easily and having difficulty breathing post-discharge. - Follow up with pulmonologist Dr. Meade for pulmonary function tests and further evaluation.  Obesity Obesity management affected by loss of Zepbound  coverage under [employer]. Considering out-of-pocket payment due to its effectiveness and tolerability. Goal: lose 22 pounds. - Provide prescription for Zepbound  12.5 mg for pharmacy with best price. - Discuss potential use of coupons or alternative payment options for Zepbound .  General Health Maintenance Due for flu and tetanus vaccinations. COVID vaccination skipped this year. - Administer flu vaccine today. - Plan for tetanus vaccination later in the fall or during next visit.  She does not have Medicare part D so we will plan to give here in our office. - Discuss COVID vaccination and consider scheduling.    Return in about 3 months (around 10/12/2024) for Weight Mgt .    Dorothyann Byars, MD

## 2024-07-13 NOTE — Assessment & Plan Note (Addendum)
 Visit #: 5 Starting Weight: 247 lbs / BMI 48    Current weight: 222 lbs/BMI 43 Previous weight: 226 lbs  Change in weight from last OV: down 4 lbs  Goal weight: 202 lbs/BMI under 40 for knee surgery  Dietary goals: Work on cutting back on soda and sweetened beverages such as juice.  She really struggles with that.  Continue to focus on eating a protein with each meal. Exercise goals: Stay active.  She is limited from knee arthritis. Work on Event organiser.   Medication: Inc to 12.5 mg Zepbound  next month. Follow-up and referrals: 12 wks  She has nausea meds at home.

## 2024-07-14 ENCOUNTER — Ambulatory Visit (INDEPENDENT_AMBULATORY_CARE_PROVIDER_SITE_OTHER): Admitting: Internal Medicine

## 2024-07-14 ENCOUNTER — Encounter: Payer: Self-pay | Admitting: Internal Medicine

## 2024-07-14 VITALS — BP 138/94 | HR 95 | Temp 98.3°F | Ht 60.0 in | Wt 220.0 lb

## 2024-07-14 DIAGNOSIS — J95811 Postprocedural pneumothorax: Secondary | ICD-10-CM

## 2024-07-14 DIAGNOSIS — R590 Localized enlarged lymph nodes: Secondary | ICD-10-CM

## 2024-07-14 DIAGNOSIS — R9389 Abnormal findings on diagnostic imaging of other specified body structures: Secondary | ICD-10-CM | POA: Diagnosis not present

## 2024-07-14 DIAGNOSIS — J4520 Mild intermittent asthma, uncomplicated: Secondary | ICD-10-CM | POA: Diagnosis not present

## 2024-07-14 DIAGNOSIS — R0602 Shortness of breath: Secondary | ICD-10-CM

## 2024-07-14 LAB — POCT EXHALED NITRIC OXIDE: FeNO level (ppb): 15

## 2024-07-14 MED ORDER — COVID-19 MRNA VAC-TRIS(PFIZER) 30 MCG/0.3ML IM SUSY: 0.3000 mL | PREFILLED_SYRINGE | Freq: Once | INTRAMUSCULAR | 0 refills | Status: AC

## 2024-07-14 NOTE — Discharge Summary (Signed)
 Physician Discharge Summary   Patient: [name] MRN: [id number] DOB: 31-Jul-1956  Admit date:     07/08/2024  Discharge date: 07/10/2024  Discharge Physician: Yetta Blanch  PCP: Alvan Dorothyann BIRCH, MD  Recommendations at discharge: Follow-up with [place] [facility] as recommended. Follow-up with PCP   Follow-up Information     Alvan Dorothyann BIRCH, MD. Schedule an appointment as soon as possible for a visit in 1 week(s).   Specialty: Family Medicine Why: with CBC lab to look at blood counts, with Repeat Chest X ray in 4 weeks Contact information: [address] [place] [address] [phone]         Meade Verdon GORMAN, MD. Go to.   Specialty: Pulmonary Disease Why: the Appointment As Scheduled, call office as needed. Contact information: [address] [place] [address] [phone]                Hospital Course: PMH of HTN and colon cancer presents the hospital with complaints of shortness of breath. 9/2 underwent bronchoscopy for sarcoidosis evaluation.  Follow-up chest x-ray showed small apical pneumothorax.  Recommended for close follow-up outpatient. 9/4 repeat chest x-ray shows slightly worsening of pneumothorax and was requested to come to the hospital for evaluation. 9/5 presented to ER.  Admitted.  Chest tube placed.  Assessment and Plan: Right pneumothorax. After procedure. Chest x-ray on 9/6 shows evidence of resolution of the pneumothorax. Chest tube chest tube was removed.  Repeat chest x-ray showed improvement in pneumothorax and resolution.  Per pulmonary patient can be discharged home with follow-up as scheduled in November.Monitor.  Hemoptysis. Patient reports that she has hemoptysis. H&H relatively stable. Not on any anticoagulation prior to admission. Likely from recent bronchoscopy. No further hemoptysis were about the patient.  Outpatient follow-up with pulmonary recommended.  Concern for  sarcoidosis. Underwent bronchoscopy outpatient.  Pulmonary follow-up on the results of the biopsy Monitor for now.  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  Hyperbilirubinemia. Mildly elevated.  Outpatient monitoring.  Mild renal insufficiency. Serum creatinine baseline 0.7.  Worsening to 1.1. Monitor for now.  Obesity Class 3 Body mass index is 43.36 kg/m.  Placing the pt at higher risk of poor outcomes.   Consultants:  [facility]  Procedures performed:  Chest tube placement  DISCHARGE MEDICATION: Allergies as of 07/10/2024       Reactions   Augmentin  [amoxicillin -pot Clavulanate] Diarrhea        Medication List     TAKE these medications    acetaminophen  500 MG tablet Commonly known as: TYLENOL  Take 500 mg by mouth every 6 (six) hours as needed for moderate pain (pain score 4-6).   albuterol  (2.5 MG/3ML) 0.083% nebulizer solution Commonly known as: PROVENTIL  Take 3 mLs (2.5 mg total) by nebulization every 4 (four) hours as needed for wheezing or shortness of breath.   albuterol  108 (90 Base) MCG/ACT inhaler Commonly known as: VENTOLIN  HFA Inhale 2 puffs into the lungs every 6 (six) hours as needed for wheezing or shortness of breath.   benzonatate  100 MG capsule Commonly known as: TESSALON  Take 1 capsule (100 mg total) by mouth 3 (three) times daily.   cetirizine 10 MG tablet Commonly known as: ZYRTEC Take 10 mg by mouth daily.   dextromethorphan  30 MG/5ML liquid Commonly known as: DELSYM  Take 5 mLs (30 mg total) by mouth 2 (two) times daily.   docusate sodium  100 MG capsule Commonly known as: COLACE Take 1 capsule (100 mg total) by mouth 2 (two) times  daily as needed for mild constipation.   fluticasone  50 MCG/ACT nasal spray Commonly known as: FLONASE  Place 2 sprays into both nostrils daily. What changed:  when to take this reasons to take this   guaiFENesin  600 MG 12 hr tablet Commonly known as: MUCINEX  Take 1 tablet (600 mg total) by mouth  2 (two) times daily for 7 days.   methocarbamol  500 MG tablet Commonly known as: ROBAXIN  Take 1 tablet (500 mg total) by mouth every 8 (eight) hours as needed for muscle spasms.   multivitamin tablet Take 1 tablet by mouth daily.   ondansetron  4 MG disintegrating tablet Commonly known as: ZOFRAN -ODT Take 1 tablet (4 mg total) by mouth every 8 (eight) hours as needed for nausea or vomiting.       Disposition: Home Diet recommendation: Cardiac diet  Discharge Exam: Vitals:   07/09/24 1605 07/09/24 2012 07/10/24 0411 07/10/24 0931  BP: 114/64 125/69 139/67 128/73  Pulse: 94 84 84 94  Resp: 17 18 18 17   Temp: 98.2 F (36.8 C) (!) 97.5 F (36.4 C) 98.1 F (36.7 C) 97.9 F (36.6 C)  TempSrc: Oral Oral Oral Oral  SpO2: 99% 100% 99% 99%  Weight:      Height:       Faint crackles. S1-S2 present Bowel sound present.  Trace edema.  Filed Weights   07/08/24 0933  Weight: 100.7 kg   Condition at discharge: stable  The results of significant diagnostics from this hospitalization (including imaging, microbiology, ancillary and laboratory) are listed below for reference.   Imaging Studies: DG CHEST PORT 1 VIEW Result Date: 07/10/2024 CLINICAL DATA:  Chest tube removal. EXAM: PORTABLE CHEST 1 VIEW COMPARISON:  Same day. FINDINGS: Status post removal of right-sided chest tube. No definite pneumothorax is noted. IMPRESSION: Status post right-sided chest tube removal. No definite pneumothorax is noted. Electronically Signed   By: Lynwood Landy Raddle M.D.   On: 07/10/2024 12:25   DG CHEST PORT 1 VIEW Result Date: 07/10/2024 CLINICAL DATA:  Pneumothorax EXAM: PORTABLE CHEST 1 VIEW COMPARISON:  Chest radiograph dated 07/09/2024 FINDINGS: Lines/tubes: Similar position of lateral right upper pleural catheter. Lungs: Well inflated lungs. Minimal right basilar patchy opacity. Pleura: No discrete pneumothorax or pleural effusion. Heart/mediastinum: The heart size and mediastinal contours are within  normal limits. Bones: No acute osseous abnormality. IMPRESSION: 1. Similar position of lateral right upper pleural catheter. No discrete pneumothorax. 2. Minimal right basilar patchy opacity, likely atelectasis. Previously noted right pleural effusion is no longer seen. Electronically Signed   By: Limin  Xu M.D.   On: 07/10/2024 09:38   DG Chest Port 1 View Result Date: 07/09/2024 CLINICAL DATA:  Chest tube in place. EXAM: PORTABLE CHEST 1 VIEW COMPARISON:  07/08/2024 FINDINGS: Right pleural drain again noted. No discernible right-sided pneumothorax. Trace right-sided pleural effusion evident. The cardiopericardial silhouette is within normal limits for size. Mild vascular congestion. No acute bony abnormality. IMPRESSION: 1. Right pleural drain in place without discernible pneumothorax. 2. Trace right pleural effusion. Electronically Signed   By: Camellia Candle M.D.   On: 07/09/2024 06:11   DG Chest Portable 1 View Result Date: 07/08/2024 CLINICAL DATA:  Worsening shortness of breath status post bronchoscopy, now status post pleural catheter placement EXAM: PORTABLE CHEST 1 VIEW COMPARISON:  Earlier same day chest radiograph FINDINGS: Interval placement of right apical pigtail pleural catheter. Interval improved right basilar patchy atelectasis. Scattered patchy right upper lung opacities may represent atelectasis or postprocedural change secondary to bronchoscopy. Unchanged patchy medial  right lower lung opacity. Interval resolution of right pneumothorax. Trace blunting of right costophrenic angle. Similar mildly enlarged cardiomediastinal silhouette. No acute osseous abnormality. IMPRESSION: 1. Interval placement of right apical pigtail pleural catheter with resolution of right pneumothorax. 2. Scattered patchy right upper lung opacities may represent atelectasis or postprocedural change secondary to bronchoscopy. 3. Unchanged patchy medial right lower lung opacity, likely atelectasis. Electronically Signed    By: Limin  Xu M.D.   On: 07/08/2024 11:22   DG Chest Portable 1 View Result Date: 07/08/2024 CLINICAL DATA:  ptx on op x-ray EXAM: PORTABLE CHEST - 1 VIEW COMPARISON:  July 07, 2024 FINDINGS: Diffuse bronchial wall thickening with patchy interstitial opacities throughout both lungs. Redemonstration of a moderate sized right pneumothorax measuring 4.4 cm at the lung apex (previously, 3.9 cm). No pleural effusion. The cardiac silhouette is at the upper limits of normal, likely accentuated by AP technique and low lung volumes. No acute fracture or destructive lesions. Multilevel thoracic osteophytosis. IMPRESSION: Continued increase in size of the large right pneumothorax, now measuring 4.4 cm at the right lung apex (previously, 3.9 cm). No significant mediastinal shift to suggest tension. These results will be called to the ordering clinician or representative by the Radiologist Assistant and communication documented in the PACS or [employer]. Electronically Signed   By: Rogelia Myers M.D.   On: 07/08/2024 09:15   DG Chest 2 View Result Date: 07/07/2024 CLINICAL DATA:  Follow-up pneumothorax. EXAM: CHEST - 2 VIEW COMPARISON:  07/05/2024 FINDINGS: Right pneumothorax has increased from prior exam, now moderate approximately 25%. No definite mediastinal shift. Chronic lung disease with bronchial thickening and interstitial coarsening. The heart is normal in size. IMPRESSION: Increased size of right pneumothorax, now moderate approximately 25%. No definite mediastinal shift. Electronically Signed   By: Andrea Gasman M.D.   On: 07/07/2024 22:37   DG CHEST PORT 1 VIEW Result Date: 07/05/2024 CLINICAL DATA:  Status post bronchoscopy biopsy EXAM: PORTABLE CHEST 1 VIEW COMPARISON:  CT chest 12/21/2023 FINDINGS: Normal cardiac silhouette. Small RIGHT apical pneumothorax with the pleural edge measuring 20 mm from the apical chest wall. No midline shift. Mild venous congestion. IMPRESSION: Small RIGHT apical  pneumothorax following bronchoscopy with tissue sampling. These results will be called to the ordering clinician or representative by the Radiologist Assistant, and communication documented in the PACS or [employer]. Electronically Signed   By: Jackquline Boxer M.D.   On: 07/05/2024 10:50   DG C-ARM BRONCHOSCOPY Result Date: 07/05/2024 C-ARM BRONCHOSCOPY: Fluoroscopy was utilized by the requesting physician.  No radiographic interpretation.    Microbiology: Results for orders placed or performed during the hospital encounter of 07/05/24  Fungus Culture With Stain     Status: None (Preliminary result)   Collection Time: 07/05/24  9:09 AM   Specimen: Bronchial Alveolar Lavage; Respiratory  Result Value Ref Range Status   Fungus Stain Final report  Final    Comment: (NOTE) Performed At: [facility] [address] [place], [place] [id number] Jennette Shorter MD Ey:[id number]    Fungus (Mycology) Culture PENDING  Incomplete   Fungal Source BRONCHIAL ALVEOLAR LAVAGE  Final    Comment: Performed at [facility], [address] [place], [place] [address]  Culture, BAL-quantitative w Gram Stain     Status: None   Collection Time: 07/05/24  9:09 AM   Specimen: Bronchial Alveolar Lavage; Respiratory  Result Value Ref Range Status   Specimen Description BRONCHIAL ALVEOLAR LAVAGE  Final   Special Requests NONE  Final   Gram Stain NO WBC SEEN NO ORGANISMS SEEN   Final   Culture   Final    NO GROWTH 2 DAYS Performed at [facility], [address] [place], [place] [address]    Report Status 07/07/2024 FINAL  Final  Aerobic/Anaerobic Culture w Gram Stain (surgical/deep wound)     Status: None   Collection Time: 07/05/24  9:09 AM   Specimen: Bronchial Alveolar Lavage; Respiratory  Result Value Ref Range Status   Specimen Description BRONCHIAL ALVEOLAR LAVAGE  Final   Special Requests RML  Final   Gram Stain NO WBC SEEN NO ORGANISMS SEEN   Final   Culture   Final     No growth aerobically or anaerobically. Performed at [facility], [address] [place], [place] [address]    Report Status 07/10/2024 FINAL  Final  Acid Fast Smear (AFB)     Status: None   Collection Time: 07/05/24  9:09 AM   Specimen: Bronchial Alveolar Lavage; Respiratory  Result Value Ref Range Status   AFB Specimen Processing Concentration  Final   Acid Fast Smear Negative  Final    Comment: (NOTE) Performed At: [facility] [address] [place], [place] [id number] Jennette Shorter MD Ey:[id number]    Source (AFB) BRONCHIAL ALVEOLAR LAVAGE  Final    Comment: Performed at [facility], [address], [place], [place] [address]  Fungus Culture Result     Status: None   Collection Time: 07/05/24  9:09 AM  Result Value Ref Range Status   Result 1 Comment  Final    Comment: (NOTE) KOH/Calcofluor preparation:  no fungus observed. Performed At: [facility] [address] [place], [place] [id number] Jennette Shorter MD Ey:[id number]   Aerobic/Anaerobic Culture w Gram Stain (surgical/deep wound)     Status: None   Collection Time: 07/05/24  9:20 AM   Specimen: PATH Respiratory biopsy; Tissue  Result Value Ref Range Status   Specimen Description TISSUE  Final   Special Requests RML BX  Final   Gram Stain NO WBC SEEN NO ORGANISMS SEEN   Final   Culture   Final    No growth aerobically or anaerobically. Performed at [facility], [address], [place], [place] [address]    Report Status 07/10/2024 FINAL  Final   Labs: CBC: Recent Labs  Lab 07/08/24 0852 07/08/24 0902 07/10/24 0248  WBC 9.0  --  7.9  HGB 13.7 14.6 12.5  HCT 43.6 43.0 39.5  MCV 78.7*  --  78.4*  PLT 299  --  254   Basic Metabolic Panel: Recent Labs  Lab 07/08/24 0852 07/08/24 0902 07/09/24 0329 07/10/24 0248  NA 140 140 140 139  K 3.5 3.4* 4.0 4.0  CL 104 103 106 105  CO2 25  --  27 27  GLUCOSE 93 93 94 95  BUN 9 9 10 9   CREATININE 0.94 0.90 1.10*  0.82  CALCIUM 8.8*  --  8.5* 8.4*   Liver Function Tests: Recent Labs  Lab 07/08/24 0852 07/10/24 0248  AST 20 16  ALT 11 10  ALKPHOS 82 71  BILITOT 1.5* 0.8  PROT 6.4* 5.4*  ALBUMIN 3.3* 2.7*   CBG: No results for input(s): GLUCAP in the last 168 hours.  Discharge time spent: greater than 30 minutes.  Author: Yetta Blanch, MD  [facility] 07/10/2024

## 2024-07-14 NOTE — Progress Notes (Signed)
 [name]    [id number]    03/12/1956  Primary Care Physician:Metheney, Dorothyann BIRCH, MD Date of Appointment: 07/14/2024 Established Patient Visit  Chief complaint:   Chief Complaint  Patient presents with   Follow-up    Pneumothorax     Shortness of Breath    Hospitalization for worsening shortness of breath     HPI: [name] is a 68 y.o. woman with history of never smoker, abnormal CT Chest with calcified hilar lymph nodes and mild fibrotic lung disease. Also history of asthma.   Interval Updates: S/p bronchoscopy for evaluation for sarcoidosis. Transbronchial biopsies negative for granulomatous disease. Surgical pathology shows mild fibrosis, chronic inflammation, patchy chronic inflammation.  Feeling more short of breath over the last few weeks. Chest pain and right shoulder pain resolved.  Has not been taking albuterol  inhaler.   Seasonal allergies controlled on flonase  and zyrtec.   Bronch cultures otherwise negative and unremarkable  I have reviewed the patient's family social and past medical history and updated as appropriate.   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   Blood transfusion without reported diagnosis    1980's   Colon cancer (HCC) 2009   colon   GERD (gastroesophageal reflux disease)    Hiatal hernia    Hypertension     Past Surgical History:  Procedure Laterality Date   BRONCHIAL BIOPSY  07/05/2024   Procedure: BRONCHOSCOPY, WITH BIOPSY;  Surgeon: Meade Verdon GORMAN, MD;  Location: [facility] ENDOSCOPY;  Service: Pulmonary;;   BRONCHIAL WASHINGS  07/05/2024   Procedure: IRRIGATION, BRONCHUS;  Surgeon: Meade Verdon GORMAN, MD;  Location: [facility] ENDOSCOPY;  Service: Pulmonary;;   CESAREAN SECTION     1982, 1988, 1989   CHOLECYSTECTOMY  1980   COLONOSCOPY     COLONOSCOPY WITH PROPOFOL  N/A 10/02/2015   Procedure: COLONOSCOPY WITH PROPOFOL ;   Surgeon: Gwendlyn ONEIDA Buddy, MD;  Location: [facility] ENDOSCOPY;  Service: Endoscopy;  Laterality: N/A;   laparoscopic sleeve gastrectomy N/A    Surgeon- Bethann Teppara   PARTIAL COLECTOMY  2009   POLYPECTOMY     TUBAL LIGATION  1989   VIDEO BRONCHOSCOPY N/A 07/05/2024   Procedure: BRONCHOSCOPY, WITH FLUOROSCOPY;  Surgeon: Meade Verdon GORMAN, MD;  Location: [facility] ENDOSCOPY;  Service: Pulmonary;  Laterality: N/A;  WITH BAL    Family History  Problem Relation Age of Onset   Asthma Mother    Hypertension Father    Heart attack Father    Lupus Sister    Stroke Paternal Grandmother    Hypertension Paternal Grandfather    Colon cancer Neg Hx    Stomach cancer Neg Hx    Esophageal cancer Neg Hx     Social History   Occupational History   Occupation: Retired [employer] fundraiser  Tobacco Use   Smoking status: Never    Passive exposure: Past   Smokeless tobacco: Never  Vaping Use   Vaping status: Never Used  Substance and Sexual Activity   Alcohol use: No    Alcohol/week: 0.0 standard drinks of alcohol   Drug use: No   Sexual activity: Not on file     Physical Exam: Blood pressure (!) 138/94, pulse 95, temperature 98.3 F (36.8 C), temperature source Oral, height 5' (1.524 m), weight 220 lb (99.8 kg), SpO2 94%.  Gen:  No acute distress, ambulates with cane ENT:  no nasal polyps, mucus membranes moist Lungs:    No increased respiratory effort, symmetric chest wall excursion, clear to auscultation bilaterally, no wheezes or crackles. Chest tube insertion site is well healed.  CV:         Regular rate and rhythm; no murmurs, rubs, or gallops.  No pedal edema Abd obese  Data Reviewed: Imaging: I have personally reviewed the chest xray from recent hospital stay shows fully re-expanded lung  PFTs:      No data to display         I have personally reviewed the patient's PFTs and   Labs: Lab Results  Component Value Date   NA 139 07/10/2024   K 4.0 07/10/2024   CO2 27 07/10/2024   GLUCOSE 95  07/10/2024   BUN 9 07/10/2024   CREATININE 0.82 07/10/2024   CALCIUM 8.4 (L) 07/10/2024   EGFR 66 10/09/2023   GFRNONAA >60 07/10/2024   Lab Results  Component Value Date   WBC 7.9 07/10/2024   HGB 12.5 07/10/2024   HCT 39.5 07/10/2024   MCV 78.4 (L) 07/10/2024   PLT 254 07/10/2024    Immunization status: Immunization History  Administered Date(s) Administered   Fluad Quad(high Dose 65+) 10/13/2022   Fluad Trivalent(High Dose 65+) 10/09/2023   INFLUENZA, HIGH DOSE SEASONAL PF 07/13/2024   Influenza,inj,Quad PF,6+ Mos 08/05/2017, 10/31/2019, 10/07/2021   Influenza,trivalent, recombinat, inj, PF 08/02/2012   Influenza-Unspecified 08/02/2012, 08/05/2017, 08/21/2018   PFIZER(Purple Top)SARS-COV-2 Vaccination 02/10/2020, 03/03/2020, 10/12/2020   PNEUMOCOCCAL CONJUGATE-20 02/09/2023   Pneumococcal Polysaccharide-23 06/30/2018, 08/21/2018   Tdap 08/02/2014   Zoster Recombinant(Shingrix) 10/31/2019, 06/01/2020    External Records Personally Reviewed: hospital stay, pcp  Assessment:  Abnormal CT Chest Calcified hilar adenopathy with fine fibrosis Mild intermittent asthma  Plan/Recommendations: I am glad your chest pain is feeling better since you were discharged the hospital.  Let me know if albuterol  helps your shortness of breath.  If it does not may be more related to asthma.  We will follow-up on your shortness of breath with the breathing testing at next visit.  Based on how it looks we may consider a repeat CT scan of your chest because there was a lot of motion artifact during your initial to scan in March.  For now the biopsy shows some chronic inflammation and scarring.  It was not consistent with sarcoidosis.  We will get some more information with the breathing testing.  Feno obtained today 14 ppb not elevated, not consistent with uncontrolled th2 inflammation  I spent 30 minutes on 07/14/2024 in care of this patient including face to face time and non-face to face  time spent charting, review of outside records, and coordination of care.   Return to Care: As scheduled in November  Verdon Gore, MD Pulmonary and Critical Care Medicine [facility] Office:[phone]

## 2024-07-14 NOTE — Patient Instructions (Addendum)
 It was a pleasure to see you today!  Please schedule follow up with myself as scheduled in November.  If my schedule is not open yet, we will contact you with a reminder closer to that time. Please call [phone] if you haven't heard from us  a month before, and always call us  sooner if issues or concerns arise. You can also send us  a message through MyChart, but but aware that this is not to be used for urgent issues and it may take up to 5-7 days to receive a reply. Please be aware that you will likely be able to view your results before I have a chance to respond to them. Please give us  5 business days to respond to any non-urgent results.   I am glad your chest pain is feeling better since you were discharged the hospital.  Let me know if albuterol  helps your shortness of breath.  If it does not may be more related to asthma. We will follow-up on your shortness of breath with the breathing testing at next visit.  Based on how it looks we may consider a repeat CT scan of your chest because there was a lot of motion artifact during your initial to scan in March.  For now the biopsy shows some chronic inflammation and scarring.  It was not consistent with sarcoidosis.  We will get some more information with the breathing testing.

## 2024-07-25 ENCOUNTER — Ambulatory Visit: Admitting: Family Medicine

## 2024-08-04 LAB — FUNGUS CULTURE WITH STAIN

## 2024-08-04 LAB — FUNGUS CULTURE RESULT

## 2024-08-04 LAB — FUNGAL ORGANISM REFLEX

## 2024-08-16 NOTE — Addendum Note (Signed)
 Addended by: Alanis Clift on: 08/16/2024 12:54 PM   Modules accepted: Orders

## 2024-08-19 LAB — ACID FAST CULTURE WITH REFLEXED SENSITIVITIES (MYCOBACTERIA): Acid Fast Culture: NEGATIVE

## 2024-09-06 ENCOUNTER — Other Ambulatory Visit: Payer: Self-pay

## 2024-09-06 DIAGNOSIS — R0602 Shortness of breath: Secondary | ICD-10-CM

## 2024-09-15 ENCOUNTER — Ambulatory Visit (INDEPENDENT_AMBULATORY_CARE_PROVIDER_SITE_OTHER): Admitting: Internal Medicine

## 2024-09-15 ENCOUNTER — Encounter: Payer: Self-pay | Admitting: Internal Medicine

## 2024-09-15 ENCOUNTER — Ambulatory Visit (INDEPENDENT_AMBULATORY_CARE_PROVIDER_SITE_OTHER)

## 2024-09-15 VITALS — BP 125/81 | HR 93 | Temp 97.5°F | Ht 59.0 in | Wt 220.2 lb

## 2024-09-15 DIAGNOSIS — R0602 Shortness of breath: Secondary | ICD-10-CM

## 2024-09-15 DIAGNOSIS — J849 Interstitial pulmonary disease, unspecified: Secondary | ICD-10-CM | POA: Diagnosis not present

## 2024-09-15 LAB — PULMONARY FUNCTION TEST
DL/VA % pred: 99 %
DL/VA: 4.3 ml/min/mmHg/L
DLCO unc % pred: 98 %
DLCO unc: 16.3 ml/min/mmHg
FEF 25-75 Post: 2.92 L/s
FEF 25-75 Pre: 2.42 L/s
FEF2575-%Change-Post: 20 %
FEF2575-%Pred-Post: 165 %
FEF2575-%Pred-Pre: 137 %
FEV1-%Change-Post: 0 %
FEV1-%Pred-Post: 110 %
FEV1-%Pred-Pre: 110 %
FEV1-Post: 2.09 L
FEV1-Pre: 2.11 L
FEV1FVC-%Change-Post: 2 %
FEV1FVC-%Pred-Pre: 110 %
FEV6-%Change-Post: -5 %
FEV6-%Pred-Post: 98 %
FEV6-%Pred-Pre: 103 %
FEV6-Post: 2.35 L
FEV6-Pre: 2.48 L
FEV6FVC-%Pred-Post: 104 %
FEV6FVC-%Pred-Pre: 104 %
FVC-%Change-Post: -3 %
FVC-%Pred-Post: 95 %
FVC-%Pred-Pre: 98 %
FVC-Post: 2.4 L
FVC-Pre: 2.48 L
Post FEV1/FVC ratio: 87 %
Post FEV6/FVC ratio: 100 %
Pre FEV1/FVC ratio: 85 %
Pre FEV6/FVC Ratio: 100 %
RV % pred: 81 %
RV: 1.54 L
TLC % pred: 94 %
TLC: 4.09 L

## 2024-09-15 NOTE — Patient Instructions (Addendum)
 It was a pleasure to see you today!  Please schedule follow up with Dr. Adrien in 2 months. Please call sooner [phone] if issues or concerns arise. You can also send us  a message through MyChart, but but aware that this is not to be used for urgent issues and it may take up to 5-7 days to receive a reply. Please be aware that you will likely be able to view your results before I have a chance to respond to them. Please give us  5 business days to respond to any non-urgent results.    Before your next visit I would like you to have: Echocardiogram  CT Chest in 1 month  For now the biopsy shows some chronic inflammation and scarring.  It was not consistent with sarcoidosis. Breathing testing is normal. Will proceed with a repeat CT Chest to follow up on the lung scarring. Will get echocardiogram to make sure heart function is ok.

## 2024-09-15 NOTE — Progress Notes (Signed)
 Full pft performed today

## 2024-09-15 NOTE — Patient Instructions (Signed)
 Full pft performed today

## 2024-09-15 NOTE — Progress Notes (Signed)
 [name]    [id number]    1955-12-25  Primary Care Physician:Metheney, Christina BIRCH, MD Date of Appointment: 09/15/2024 Established Patient Visit  Chief complaint:   Chief Complaint  Patient presents with   Shortness of Breath    Follow up pft     HPI: [name] is a 68 y.o. woman with history of never smoker, abnormal CT Chest with calcified hilar lymph nodes and mild fibrotic lung disease. Also history of asthma. S/p bronchoscopy for evaluation for sarcoidosis. Transbronchial biopsies negative for granulomatous disease. Surgical pathology shows mild fibrosis, chronic inflammation, patchy chronic inflammation.  Bronch cultures otherwise negative and unremarkable  Interval Updates: She is here for follow up after PFTS which show normal pulmonary function.  Minimal albuterol  use.  She does have fatigue. Denies snoring, witnessed apneas.   Does have dyspnea with exertion without chest tightness or wheezing  She told me [place].   Seasonal allergies are controlled on flonase .   I have reviewed the patient's family social and past medical history and updated as appropriate.   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   Blood transfusion without reported diagnosis    1980's   Colon cancer (HCC) 2009   colon   GERD (gastroesophageal reflux disease)    Hiatal hernia    Hypertension     Past Surgical History:  Procedure Laterality Date   BRONCHIAL BIOPSY  07/05/2024   Procedure: BRONCHOSCOPY, WITH BIOPSY;  Surgeon: Meade Verdon GORMAN, MD;  Location: [facility] ENDOSCOPY;  Service: Pulmonary;;   BRONCHIAL WASHINGS  07/05/2024   Procedure: IRRIGATION, BRONCHUS;  Surgeon: Meade Verdon GORMAN, MD;  Location: [facility] ENDOSCOPY;  Service: Pulmonary;;   CESAREAN SECTION     1982, 1988, 1989   CHOLECYSTECTOMY  1980    COLONOSCOPY     COLONOSCOPY WITH PROPOFOL  N/A 10/02/2015   Procedure: COLONOSCOPY WITH PROPOFOL ;  Surgeon: Gwendlyn ONEIDA Buddy, MD;  Location: [facility] ENDOSCOPY;  Service: Endoscopy;  Laterality: N/A;   laparoscopic sleeve gastrectomy N/A    Surgeon- Bethann Teppara   PARTIAL COLECTOMY  2009   POLYPECTOMY     TUBAL LIGATION  1989   VIDEO BRONCHOSCOPY N/A 07/05/2024   Procedure: BRONCHOSCOPY, WITH FLUOROSCOPY;  Surgeon: Meade Verdon GORMAN, MD;  Location: [facility] ENDOSCOPY;  Service: Pulmonary;  Laterality: N/A;  WITH BAL    Family History  Problem Relation Age of Onset   Asthma Mother    Hypertension Father    Heart attack Father    Lupus Sister    Stroke Paternal Grandmother    Hypertension Paternal Grandfather    Colon cancer Neg Hx    Stomach cancer Neg Hx    Esophageal cancer Neg Hx     Social History   Occupational History   Occupation: Retired [employer]  Tobacco Use   Smoking status: Never    Passive exposure: Past   Smokeless tobacco: Never  Vaping Use   Vaping status: Never Used  Substance and Sexual Activity   Alcohol use: No    Alcohol/week: 0.0 standard drinks of alcohol   Drug use: No   Sexual activity: Not on file     Physical Exam: Blood pressure 125/81, pulse 93, temperature (!) 97.5 F (36.4 C), temperature  source Oral, height 4' 11 (1.499 m), weight 220 lb 3.2 oz (99.9 kg), SpO2 100%.  Gen:      No distress ENT:  mmm Lungs:    bibasilar crackles, no wheeze CV:        RRR no mrg  Data Reviewed: Imaging: I have personally reviewed the CT Chest march 2025 which shows patchy bilateral GGOs  PFTs:     Latest Ref Rng & Units 09/15/2024    9:00 AM  PFT Results  FVC-Pre L 2.48  P  FVC-Predicted Pre % 98  P  FVC-Post L 2.40  P  FVC-Predicted Post % 95  P  Pre FEV1/FVC % % 85  P  Post FEV1/FCV % % 87  P  FEV1-Pre L 2.11  P  FEV1-Predicted Pre % 110  P  FEV1-Post L 2.09  P  DLCO uncorrected ml/min/mmHg 16.30  P  DLCO UNC% % 98  P  DLVA Predicted % 99  P  TLC L  4.09  P  TLC % Predicted % 94  P  RV % Predicted % 81  P    P Preliminary result   I have personally reviewed the patient's PFTs and they show normal pulmonary function.   Labs: Lab Results  Component Value Date   NA 139 07/10/2024   K 4.0 07/10/2024   CO2 27 07/10/2024   GLUCOSE 95 07/10/2024   BUN 9 07/10/2024   CREATININE 0.82 07/10/2024   CALCIUM 8.4 (L) 07/10/2024   EGFR 66 10/09/2023   GFRNONAA >60 07/10/2024   Lab Results  Component Value Date   WBC 7.9 07/10/2024   HGB 12.5 07/10/2024   HCT 39.5 07/10/2024   MCV 78.4 (L) 07/10/2024   PLT 254 07/10/2024    Immunization status: Immunization History  Administered Date(s) Administered   Fluad Quad(high Dose 65+) 10/13/2022   Fluad Trivalent(High Dose 65+) 10/09/2023   INFLUENZA, HIGH DOSE SEASONAL PF 07/13/2024   Influenza,inj,Quad PF,6+ Mos 08/05/2017, 10/31/2019, 10/07/2021   Influenza,trivalent, recombinat, inj, PF 08/02/2012   Influenza-Unspecified 08/02/2012, 08/05/2017, 08/21/2018   PFIZER(Purple Top)SARS-COV-2 Vaccination 02/10/2020, 03/03/2020, 10/12/2020   PNEUMOCOCCAL CONJUGATE-20 02/09/2023   Pneumococcal Polysaccharide-23 06/30/2018, 08/21/2018   Tdap 08/02/2014   Zoster Recombinant(Shingrix) 10/31/2019, 06/01/2020    External Records Personally Reviewed: hospital stay, pcp  Assessment:  Abnormal CT Chest Calcified hilar adenopathy with fine fibrosis, early ILD? Mild intermittent asthma  Plan/Recommendations:  For now the biopsy shows some chronic inflammation and scarring.  It was not consistent with sarcoidosis. Breathing testing is normal. Will proceed with a repeat CT Chest to follow up on the lung scarring. Will get echocardiogram to make sure heart function is ok.   Continue albuterol  as needed.    Return to Care: Return in about 2 months (around 11/15/2024) for Dr. Adrien, ILD.   Verdon Gore, MD Pulmonary and Critical Care Medicine [facility] Office:[phone]

## 2024-10-04 ENCOUNTER — Other Ambulatory Visit (HOSPITAL_BASED_OUTPATIENT_CLINIC_OR_DEPARTMENT_OTHER)

## 2024-10-06 ENCOUNTER — Ambulatory Visit (HOSPITAL_BASED_OUTPATIENT_CLINIC_OR_DEPARTMENT_OTHER)
Admission: RE | Admit: 2024-10-06 | Discharge: 2024-10-06 | Disposition: A | Source: Ambulatory Visit | Attending: Internal Medicine

## 2024-10-06 DIAGNOSIS — R0602 Shortness of breath: Secondary | ICD-10-CM | POA: Insufficient documentation

## 2024-10-06 DIAGNOSIS — J849 Interstitial pulmonary disease, unspecified: Secondary | ICD-10-CM | POA: Insufficient documentation

## 2024-10-06 LAB — ECHOCARDIOGRAM COMPLETE
AR max vel: 2.44 cm2
AV Area VTI: 2.44 cm2
AV Area mean vel: 2.55 cm2
AV Mean grad: 4 mmHg
AV Peak grad: 6.6 mmHg
Ao pk vel: 1.28 m/s
Area-P 1/2: 3.83 cm2
Calc EF: 57.8 %
MV M vel: 2.01 m/s
MV Peak grad: 16.2 mmHg
S' Lateral: 2.5 cm
Single Plane A2C EF: 60.2 %
Single Plane A4C EF: 59.4 %

## 2024-10-12 ENCOUNTER — Ambulatory Visit: Admitting: Family Medicine

## 2024-10-13 ENCOUNTER — Other Ambulatory Visit: Payer: Self-pay

## 2024-10-13 ENCOUNTER — Ambulatory Visit
Admission: EM | Admit: 2024-10-13 | Discharge: 2024-10-13 | Disposition: A | Discharge status: Home / Self Care | Attending: Family Medicine | Admitting: Family Medicine

## 2024-10-13 DIAGNOSIS — R21 Rash and other nonspecific skin eruption: Secondary | ICD-10-CM | POA: Diagnosis not present

## 2024-10-13 DIAGNOSIS — M7061 Trochanteric bursitis, right hip: Secondary | ICD-10-CM | POA: Diagnosis not present

## 2024-10-13 DIAGNOSIS — J3489 Other specified disorders of nose and nasal sinuses: Secondary | ICD-10-CM

## 2024-10-13 MED ORDER — TRIAMCINOLONE ACETONIDE 0.1 % EX CREA: 1.0000 | TOPICAL_CREAM | Freq: Two times a day (BID) | CUTANEOUS | 0 refills | Status: AC

## 2024-10-13 MED ORDER — METHYLPREDNISOLONE 4 MG PO TBPK: ORAL_TABLET | ORAL | 0 refills | Status: AC

## 2024-10-13 MED ORDER — TRAMADOL HCL 50 MG PO TABS: 50.0000 mg | ORAL_TABLET | Freq: Four times a day (QID) | ORAL | 0 refills | Status: AC | PRN

## 2024-10-13 NOTE — ED Triage Notes (Signed)
 Pt presenting with c/o right hip pain  x 2 days. Pt stated that she had previously injured her right hip and this is a flare up. Pt stated that she used Tylenol  ES with minimal effectiveness.

## 2024-10-13 NOTE — ED Provider Notes (Signed)
 [name] CARE    CSN: [id number] Arrival date & time: 10/13/24  1038      History   Chief Complaint Chief Complaint  Patient presents with   Hip Pain    HPI [name] is a 68 y.o. female.   Patient is here with multiple complaints/requests.  She states she has right hip pain.  It bothers her periodically for years.  She has not had any accident or injury.  She states that when she tried to get off the couch a couple days ago she noticed her hip was quite sore.  Pain with weightbearing.  No radiation.  Not improved with Tylenol . Patient also has some sinus drainage wants me to look at her throat Patient states that she would like a refill of her triamcinolone .  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   Sebaceous cyst 11/02/2019   OSA (obstructive sleep apnea) 03/22/2018   Trochanteric bursitis of right hip 05/14/2017   Morbid obesity (HCC) 08/01/2016   IFG (impaired fasting glucose) 07/14/2016   Primary osteoarthritis of both hands 08/03/2015   Elevated LDL cholesterol level 09/16/2013   Trigger thumb of left hand 09/16/2013   Knee osteoarthritis 09/16/2013   IDA (iron deficiency anemia) 09/16/2013   Asthma 09/16/2013   Vitamin D  deficiency 09/16/2013   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    Past Surgical History:  Procedure Laterality Date    BRONCHIAL BIOPSY  07/05/2024   Procedure: BRONCHOSCOPY, WITH BIOPSY;  Surgeon: Meade Verdon GORMAN, MD;  Location: [facility] ENDOSCOPY;  Service: Pulmonary;;   BRONCHIAL WASHINGS  07/05/2024   Procedure: IRRIGATION, BRONCHUS;  Surgeon: Meade Verdon GORMAN, MD;  Location: [facility] ENDOSCOPY;  Service: Pulmonary;;   CESAREAN SECTION     1982, 1988, 1989   CHOLECYSTECTOMY  1980   COLONOSCOPY     COLONOSCOPY WITH PROPOFOL  N/A 10/02/2015   Procedure: COLONOSCOPY WITH PROPOFOL ;  Surgeon: Gwendlyn ONEIDA Buddy, MD;  Location: [facility] ENDOSCOPY;  Service: Endoscopy;  Laterality: N/A;   laparoscopic sleeve gastrectomy N/A    Surgeon- Bethann Teppara   PARTIAL COLECTOMY  2009   POLYPECTOMY     TUBAL LIGATION  1989   VIDEO BRONCHOSCOPY N/A 07/05/2024   Procedure: BRONCHOSCOPY, WITH FLUOROSCOPY;  Surgeon: Meade Verdon GORMAN, MD;  Location: [facility] ENDOSCOPY;  Service: Pulmonary;  Laterality: N/A;  WITH BAL    OB History     Gravida  3   Para  3   Term  3   Preterm      AB      Living         SAB      IAB      Ectopic      Multiple      Live Births  3            Home Medications    Prior to Admission medications  Medication Sig Start Date End Date Taking? Authorizing Provider  methylPREDNISolone  (MEDROL  DOSEPAK) 4 MG TBPK tablet tad 10/13/24  Yes Maranda Jamee Jacob, MD  traMADol  (ULTRAM ) 50 MG tablet Take 1 tablet (50 mg total) by mouth every 6 (six) hours as needed. 10/13/24  Yes Maranda Jamee Jacob, MD  triamcinolone  cream (KENALOG ) 0.1 % Apply 1 Application topically 2 (two) times daily. 10/13/24  Yes Maranda Jamee Jacob, MD  acetaminophen  (TYLENOL ) 500 MG tablet Take 500 mg by mouth every 6 (six) hours as needed for moderate pain (pain score 4-6).    [provider]  albuterol  (PROVENTIL ) (2.5 MG/3ML) 0.083% nebulizer solution Take 3 mLs (2.5 mg total) by nebulization every 4 (four) hours as needed for wheezing or shortness of breath. 10/09/23   Alvan Dorothyann BIRCH, MD  albuterol  (VENTOLIN  HFA) 108 [phone]  Base) MCG/ACT inhaler Inhale 2 puffs into the lungs every 6 (six) hours as needed for wheezing or shortness of breath. 10/09/23   Alvan Dorothyann BIRCH, MD  cetirizine (ZYRTEC) 10 MG tablet Take 10 mg by mouth daily.     [provider]  docusate sodium  (COLACE) 100 MG capsule Take 1 capsule (100 mg total) by mouth 2 (two) times daily as needed for mild constipation. 07/10/24   Tobie Yetta HERO, MD  fluticasone  (FLONASE ) 50 MCG/ACT nasal spray Place 2 sprays into both nostrils daily. 10/09/23   Alvan Dorothyann BIRCH, MD  Multiple Vitamin (MULTIVITAMIN) tablet Take 1 tablet by mouth daily.    [provider]  ondansetron  (ZOFRAN -ODT) 4 MG disintegrating tablet Take 1 tablet (4 mg total) by mouth every 8 (eight) hours as needed for nausea or vomiting. 02/01/24   Alvan Dorothyann BIRCH, MD  tirzepatide  (ZEPBOUND ) 12.5 MG/0.5ML Pen Inject 12.5 mg into the skin once a week. Patient not taking: Reported on 09/15/2024 07/13/24   Alvan Dorothyann BIRCH, MD    Family History Family History  Problem Relation Age of Onset   Asthma Mother    Hypertension Father    Heart attack Father    Lupus Sister    Stroke Paternal Grandmother    Hypertension Paternal Grandfather    Colon cancer Neg Hx    Stomach cancer Neg Hx    Esophageal cancer Neg Hx     Social History Social History[1]   Allergies   Augmentin  [amoxicillin -pot clavulanate]   Review of Systems Review of Systems  See HPI Physical Exam Triage Vital Signs ED Triage Vitals  Encounter Vitals Group     BP 10/13/24 1057 132/83     Girls Systolic BP Percentile --      Girls Diastolic BP Percentile --      Boys Systolic BP Percentile --      Boys Diastolic BP Percentile --      Pulse Rate 10/13/24 1057 81     Resp 10/13/24 1057 18     Temp 10/13/24 1057 98.6 F (37 C)     Temp Source 10/13/24 1057 Oral     SpO2 10/13/24 1057 97 %     Weight 10/13/24 1100 224 lb 11.2 oz (101.9 kg)     Height 10/13/24 1100 5' (1.524 m)      Head Circumference --      Peak Flow --      Pain Score 10/13/24 1059 8     Pain Loc --  Pain Education --      Exclude from Growth Chart --    No data found.  Updated Vital Signs BP 132/83 (BP Location: Right Arm)   Pulse 81   Temp 98.6 F (37 C) (Oral)   Resp 18   Ht 5' (1.524 m)   Wt 101.9 kg   SpO2 97%   BMI 43.88 kg/m       Physical Exam Constitutional:      General: She is not in acute distress.    Appearance: She is well-developed. She is obese.  HENT:     Head: Normocephalic and atraumatic.     Right Ear: Tympanic membrane normal.     Left Ear: Tympanic membrane normal.     Nose: Congestion present.     Comments: Clear rhinorrhea.  Mild posterior pharyngeal erythema.    Mouth/Throat:     Pharynx: Posterior oropharyngeal erythema present.  Eyes:     Conjunctiva/sclera: Conjunctivae normal.     Pupils: Pupils are equal, round, and reactive to light.  Cardiovascular:     Rate and Rhythm: Normal rate.  Pulmonary:     Effort: Pulmonary effort is normal. No respiratory distress.  Abdominal:     General: There is no distension.     Palpations: Abdomen is soft.  Musculoskeletal:        General: Tenderness present. Normal range of motion.     Cervical back: Normal range of motion.     Comments: Tenderness directly over the right greater trochanter.  Pain with range of motion of hip.  No tenderness over the posterior pelvis, SI, or lumbar spine.  Skin:    General: Skin is warm and dry.  Neurological:     Mental Status: She is alert.      UC Treatments / Results  Labs (all labs ordered are listed, but only abnormal results are displayed) Labs Reviewed - No data to display  EKG   Radiology No results found.  Procedures Procedures (including critical care time)  Medications Ordered in UC Medications - No data to display  Initial Impression / Assessment and Plan / UC Course  I have reviewed the triage vital signs and the nursing  notes.  Pertinent labs & imaging results that were available during my care of the patient were reviewed by me and considered in my medical decision making (see chart for details).     Will give Medrol  Dosepak for the hip pain.  Ice may also help I explained the Medrol  Dosepak will also help with her sinus drainage.  She likely has a virus Triamcinolone  refilled Follow-up with family medicine Final Clinical Impressions(s) / UC Diagnoses   Final diagnoses:  Trochanteric bursitis, right hip  Rash and nonspecific skin eruption  Sinus drainage     Discharge Instructions      Take the Medrol  Dosepak as directed.  The steroid will help with the back and hip pain Use triamcinolone  cream on your rash as needed May take Tylenol  for moderate pain.  When pain is severe take tramadol . Follow-up with your primary care doctor   ED Prescriptions     Medication Sig Dispense Auth. Provider   methylPREDNISolone  (MEDROL  DOSEPAK) 4 MG TBPK tablet tad 21 tablet Maranda Jamee Jacob, MD   triamcinolone  cream (KENALOG ) 0.1 % Apply 1 Application topically 2 (two) times daily. 30 g Maranda Jamee Jacob, MD   traMADol  (ULTRAM ) 50 MG tablet Take 1 tablet (50 mg total) by mouth every 6 (six) hours as needed.  15 tablet Maranda Jamee Jacob, MD      I have reviewed the PDMP during this encounter.    [1]  Social History Tobacco Use   Smoking status: Never    Passive exposure: Past   Smokeless tobacco: Never  Vaping Use   Vaping status: Never Used  Substance Use Topics   Alcohol use: No    Alcohol/week: 0.0 standard drinks of alcohol   Drug use: No     Maranda Jamee Jacob, MD 10/13/24 1123

## 2024-10-13 NOTE — Discharge Instructions (Addendum)
 Take the Medrol  Dosepak as directed.  The steroid will help with the back and hip pain Use triamcinolone  cream on your rash as needed May take Tylenol  for moderate pain.  When pain is severe take tramadol . Follow-up with your primary care doctor

## 2024-11-09 NOTE — Progress Notes (Incomplete)
 "              [name]    [id number]    03/29/1956  Primary Care Physician:Metheney, Dorothyann BIRCH, MD Date of Appointment: 11/09/2024 Established Patient Visit  Chief complaint:   No chief complaint on file.    HPI: [name] is a 69 y.o. woman with history of never smoker, abnormal CT Chest with calcified hilar lymph nodes and mild fibrotic lung disease. Also history of asthma. S/p bronchoscopy on 07/2024 for evaluation for sarcoidosis. Transbronchial biopsies negative for granulomatous disease and malignancy. Pathology shows mild fibrosis, patchy chronic inflammation. Cytology showed numerouds extracellular eosinophilic granules, non specific but may represent degranulated eosinophils. Bronch cultures otherwise negative and unremarkable (neg AFB, fungal)  Interval Updates: She is here for follow up after PFTS which show normal pulmonary function.  Minimal albuterol  use.  She does have fatigue. Denies snoring, witnessed apneas.   Does have dyspnea with exertion without chest tightness or wheezing  She told me [place].   Seasonal allergies are controlled on flonase .   I have reviewed the patient's family social and past medical history and updated as appropriate.   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   Blood transfusion without reported diagnosis    1980's   Colon cancer (HCC) 2009   colon   GERD (gastroesophageal reflux disease)    Hiatal hernia    Hypertension     Past Surgical History:  Procedure Laterality Date   BRONCHIAL BIOPSY  07/05/2024   Procedure: BRONCHOSCOPY, WITH BIOPSY;  Surgeon: Meade Verdon GORMAN, MD;  Location: [facility] ENDOSCOPY;  Service: Pulmonary;;   BRONCHIAL WASHINGS  07/05/2024   Procedure: IRRIGATION, BRONCHUS;  Surgeon: Meade Verdon GORMAN, MD;  Location: [facility] ENDOSCOPY;  Service: Pulmonary;;    CESAREAN SECTION     1982, 1988, 1989   CHOLECYSTECTOMY  1980   COLONOSCOPY     COLONOSCOPY WITH PROPOFOL  N/A 10/02/2015   Procedure: COLONOSCOPY WITH PROPOFOL ;  Surgeon: Gwendlyn ONEIDA Buddy, MD;  Location: [facility] ENDOSCOPY;  Service: Endoscopy;  Laterality: N/A;   laparoscopic sleeve gastrectomy N/A    Surgeon- Bethann Teppara   PARTIAL COLECTOMY  2009   POLYPECTOMY     TUBAL LIGATION  1989   VIDEO BRONCHOSCOPY N/A 07/05/2024   Procedure: BRONCHOSCOPY, WITH FLUOROSCOPY;  Surgeon: Meade Verdon GORMAN, MD;  Location: [facility] ENDOSCOPY;  Service: Pulmonary;  Laterality: N/A;  WITH BAL    Family History  Problem Relation Age of Onset   Asthma Mother    Hypertension Father    Heart attack Father    Lupus Sister    Stroke Paternal Grandmother    Hypertension Paternal Grandfather    Colon cancer Neg Hx    Stomach cancer Neg Hx    Esophageal cancer Neg Hx     Social History   Occupational History   Occupation: Retired [employer]  Tobacco Use   Smoking status: Never    Passive exposure: Past   Smokeless tobacco: Never  Vaping Use   Vaping status: Never Used  Substance and Sexual Activity   Alcohol use: No    Alcohol/week: 0.0 standard drinks of alcohol   Drug use: No   Sexual activity: Not on file     Physical Exam: There were  no vitals taken for this visit.  Gen:      No distress ENT:  mmm Lungs:    bibasilar crackles, no wheeze CV:        RRR no mrg  Data Reviewed:  PFTs: 09/15/24 - normal spirometry, normal lung volumes, normal DLCO, no change with BD.      Latest Ref Rng & Units 09/15/2024    9:00 AM  PFT Results  FVC-Pre L 2.48   FVC-Predicted Pre % 98   FVC-Post L 2.40   FVC-Predicted Post % 95   Pre FEV1/FVC % % 85   Post FEV1/FCV % % 87   FEV1-Pre L 2.11   FEV1-Predicted Pre % 110   FEV1-Post L 2.09   DLCO uncorrected ml/min/mmHg 16.30   DLCO UNC% % 98   DLVA Predicted % 99   TLC L 4.09   TLC % Predicted % 94   RV % Predicted % 81    I have personally reviewed the  patient's PFTs and they show normal pulmonary function.   Labs: Lab Results  Component Value Date   NA 139 07/10/2024   K 4.0 07/10/2024   CO2 27 07/10/2024   GLUCOSE 95 07/10/2024   BUN 9 07/10/2024   CREATININE 0.82 07/10/2024   CALCIUM 8.4 (L) 07/10/2024   EGFR 66 10/09/2023   GFRNONAA >60 07/10/2024   Lab Results  Component Value Date   WBC 7.9 07/10/2024   HGB 12.5 07/10/2024   HCT 39.5 07/10/2024   MCV 78.4 (L) 07/10/2024   PLT 254 07/10/2024    Immunization status: Immunization History  Administered Date(s) Administered   Fluad Quad(high Dose 65+) 10/13/2022   Fluad Trivalent(High Dose 65+) 10/09/2023   INFLUENZA, HIGH DOSE SEASONAL PF 07/13/2024   Influenza,inj,Quad PF,6+ Mos 08/05/2017, 10/31/2019, 10/07/2021   Influenza,trivalent, recombinat, inj, PF 08/02/2012   Influenza-Unspecified 08/02/2012, 08/05/2017, 08/21/2018   PFIZER(Purple Top)SARS-COV-2 Vaccination 02/10/2020, 03/03/2020, 10/12/2020   PNEUMOCOCCAL CONJUGATE-20 02/09/2023   Pneumococcal Polysaccharide-23 06/30/2018, 08/21/2018   Tdap 08/02/2014   Zoster Recombinant(Shingrix) 10/31/2019, 06/01/2020    External Records Personally Reviewed: hospital stay, pcp  Echo 10/06/24  EF 60-65%. No WMA.  RV fx and size normal. No valvulopathy.   10/06/24 CTHR Chronic faintly calcified mediastinal and bilateral hilar adenopathy compatible with prior granulomatous disease such as due to sarcoidosis. Chronic mild air trapping. Mild bronchiectasis with associated mild patchy peribronchovascular reticulation, thin parenchymal bands and mild architectural distortion, unchanged from 12/21/23  Assessment:  Abnormal CT Chest Calcified hilar adenopathy with fine fibrosis, early ILD? Mild intermittent asthma  Plan/Recommendations:  For now the biopsy shows some chronic inflammation and scarring.  It was not consistent with sarcoidosis. Breathing testing is normal. Will proceed with a repeat CT Chest to follow  up on the lung scarring. Will get echocardiogram to make sure heart function is ok.   Continue albuterol  as needed.    Return to Care: No follow-ups on file.   Verdon Gore, MD Pulmonary and Critical Care Medicine [facility] Office:[phone]      "

## 2024-11-10 ENCOUNTER — Ambulatory Visit

## 2024-11-13 NOTE — Progress Notes (Signed)
 "              [name]    [id number]    [birth year]  Primary Care Physician:Metheney, Dorothyann BIRCH, MD Date of Appointment: 11/13/2024 Established Patient Visit  Chief complaint:   No chief complaint on file.    HPI: [name] is a 69 y.o. woman with history of never smoker, abnormal CT Chest with calcified hilar lymph nodes and mild fibrotic lung disease. Also history of asthma. S/p bronchoscopy on 07/2024 for evaluation for sarcoidosis. Transbronchial biopsies negative for granulomatous disease and malignancy. Pathology shows mild fibrosis, patchy chronic inflammation. Cytology showed numerouds extracellular eosinophilic granules, non specific but may represent degranulated eosinophils. Bronch cultures otherwise negative and unremarkable (neg AFB, fungal). PFT was normal.  [place].   Interval Updates: She is here for follow up after PFTS which show normal pulmonary function.  Minimal albuterol  use.  She does have fatigue. Denies snoring, witnessed apneas.   Does have dyspnea with exertion without chest tightness or wheezing  She told me that   Seasonal allergies are controlled on flonase .   I have reviewed the patient's family social and past medical history and updated as appropriate.   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   Blood transfusion without reported diagnosis    1980's   Colon cancer (HCC) 2009   colon   GERD (gastroesophageal reflux disease)    Hiatal hernia    Hypertension     Past Surgical History:  Procedure Laterality Date   BRONCHIAL BIOPSY  07/05/2024   Procedure: BRONCHOSCOPY, WITH BIOPSY;  Surgeon: Meade Verdon GORMAN, MD;  Location: [facility] ENDOSCOPY;  Service: Pulmonary;;   BRONCHIAL WASHINGS  07/05/2024   Procedure: IRRIGATION, BRONCHUS;  Surgeon: Meade Verdon GORMAN, MD;  Location: [facility] ENDOSCOPY;   Service: Pulmonary;;   CESAREAN SECTION     1982, 1988, 1989   CHOLECYSTECTOMY  1980   COLONOSCOPY     COLONOSCOPY WITH PROPOFOL  N/A 10/02/2015   Procedure: COLONOSCOPY WITH PROPOFOL ;  Surgeon: Gwendlyn ONEIDA Buddy, MD;  Location: [facility] ENDOSCOPY;  Service: Endoscopy;  Laterality: N/A;   laparoscopic sleeve gastrectomy N/A    Surgeon- Bethann Teppara   PARTIAL COLECTOMY  2009   POLYPECTOMY     TUBAL LIGATION  1989   VIDEO BRONCHOSCOPY N/A 07/05/2024   Procedure: BRONCHOSCOPY, WITH FLUOROSCOPY;  Surgeon: Meade Verdon GORMAN, MD;  Location: [facility] ENDOSCOPY;  Service: Pulmonary;  Laterality: N/A;  WITH BAL    Family History  Problem Relation Age of Onset   Asthma Mother    Hypertension Father    Heart attack Father    Lupus Sister    Stroke Paternal Grandmother    Hypertension Paternal Grandfather    Colon cancer Neg Hx    Stomach cancer Neg Hx    Esophageal cancer Neg Hx     Social History   Occupational History   Occupation: Retired [employer]  Tobacco Use   Smoking status: Never    Passive exposure: Past   Smokeless tobacco: Never  Vaping Use   Vaping status: Never Used  Substance and Sexual Activity   Alcohol use: No    Alcohol/week: 0.0 standard drinks of alcohol   Drug use: No   Sexual activity: Not on file  Physical Exam: There were no vitals taken for this visit.  Gen:      No distress ENT:  mmm Lungs:    bibasilar crackles, no wheeze CV:        RRR no mrg  Data Reviewed:  PFTs: 09/15/24 - normal spirometry, normal lung volumes, normal DLCO, no change with BD.      Latest Ref Rng & Units 09/15/2024    9:00 AM  PFT Results  FVC-Pre L 2.48   FVC-Predicted Pre % 98   FVC-Post L 2.40   FVC-Predicted Post % 95   Pre FEV1/FVC % % 85   Post FEV1/FCV % % 87   FEV1-Pre L 2.11   FEV1-Predicted Pre % 110   FEV1-Post L 2.09   DLCO uncorrected ml/min/mmHg 16.30   DLCO UNC% % 98   DLVA Predicted % 99   TLC L 4.09   TLC % Predicted % 94   RV % Predicted % 81    I have  personally reviewed the patient's PFTs and they show normal pulmonary function.   Labs: Lab Results  Component Value Date   NA 139 07/10/2024   K 4.0 07/10/2024   CO2 27 07/10/2024   GLUCOSE 95 07/10/2024   BUN 9 07/10/2024   CREATININE 0.82 07/10/2024   CALCIUM 8.4 (L) 07/10/2024   EGFR 66 10/09/2023   GFRNONAA >60 07/10/2024   Lab Results  Component Value Date   WBC 7.9 07/10/2024   HGB 12.5 07/10/2024   HCT 39.5 07/10/2024   MCV 78.4 (L) 07/10/2024   PLT 254 07/10/2024    Immunization status: Immunization History  Administered Date(s) Administered   Fluad Quad(high Dose 65+) 10/13/2022   Fluad Trivalent(High Dose 65+) 10/09/2023   INFLUENZA, HIGH DOSE SEASONAL PF 07/13/2024   Influenza,inj,Quad PF,6+ Mos 08/05/2017, 10/31/2019, 10/07/2021   Influenza,trivalent, recombinat, inj, PF 08/02/2012   Influenza-Unspecified 08/02/2012, 08/05/2017, 08/21/2018   PFIZER(Purple Top)SARS-COV-2 Vaccination 02/10/2020, 03/03/2020, 10/12/2020   PNEUMOCOCCAL CONJUGATE-20 02/09/2023   Pneumococcal Polysaccharide-23 06/30/2018, 08/21/2018   Tdap 08/02/2014   Zoster Recombinant(Shingrix) 10/31/2019, 06/01/2020    External Records Personally Reviewed: hospital stay, pcp  Echo 10/06/24  EF 60-65%. No WMA.  RV fx and size normal. No valvulopathy.   10/06/24 CTHR Chronic faintly calcified mediastinal and bilateral hilar adenopathy compatible with prior granulomatous disease such as due to sarcoidosis. Chronic mild air trapping. Mild bronchiectasis with associated mild patchy peribronchovascular reticulation, thin parenchymal bands and mild architectural distortion, unchanged from 12/21/23  Assessment:  Abnormal CT Chest Calcified hilar adenopathy with fine fibrosis, early ILD? Mild intermittent asthma  Plan/Recommendations:  For now the biopsy shows some chronic inflammation and scarring.  It was not consistent with sarcoidosis. Breathing testing is normal. Will proceed with a  repeat CT Chest to follow up on the lung scarring. Will get echocardiogram to make sure heart function is ok.   Continue albuterol  as needed.    Return to Care: No follow-ups on file.   Verdon Gore, MD Pulmonary and Critical Care Medicine [facility] [phone]      "

## 2024-11-14 ENCOUNTER — Ambulatory Visit

## 2024-11-14 VITALS — BP 138/90 | HR 79 | Temp 97.6°F | Ht 60.0 in | Wt 229.8 lb

## 2024-11-14 DIAGNOSIS — J4520 Mild intermittent asthma, uncomplicated: Secondary | ICD-10-CM | POA: Diagnosis not present

## 2024-11-14 DIAGNOSIS — R918 Other nonspecific abnormal finding of lung field: Secondary | ICD-10-CM

## 2024-11-14 DIAGNOSIS — R0602 Shortness of breath: Secondary | ICD-10-CM

## 2024-11-14 DIAGNOSIS — J849 Interstitial pulmonary disease, unspecified: Secondary | ICD-10-CM

## 2024-11-14 NOTE — Patient Instructions (Signed)
 Dear Ms. Arland;  Your pulmonary function test is normal, CT scan similar than a year ago, and your echocardiogram is normal.  I highly recommend to lose weight, I think this could be the potential cause of your shortness of breath.  I will see you in 6 months to repeat a pulmonary function test.

## 2025-01-31 ENCOUNTER — Ambulatory Visit: Admitting: Family Medicine
